# Patient Record
Sex: Male | Born: 1945 | Race: White | Hispanic: No | Marital: Married | Smoking: Never smoker
Health system: Southern US, Community
[De-identification: ages and names within clinical notes are randomized; demographics above are authoritative.]

## PROBLEM LIST (undated history)

## (undated) DIAGNOSIS — E782 Mixed hyperlipidemia: Secondary | ICD-10-CM

## (undated) DIAGNOSIS — I25119 Atherosclerotic heart disease of native coronary artery with unspecified angina pectoris: Secondary | ICD-10-CM

## (undated) DIAGNOSIS — Z7982 Long term (current) use of aspirin: Secondary | ICD-10-CM

## (undated) DIAGNOSIS — I700 Atherosclerosis of aorta: Secondary | ICD-10-CM

## (undated) DIAGNOSIS — M1990 Unspecified osteoarthritis, unspecified site: Secondary | ICD-10-CM

## (undated) DIAGNOSIS — R7303 Prediabetes: Secondary | ICD-10-CM

## (undated) DIAGNOSIS — H269 Unspecified cataract: Secondary | ICD-10-CM

## (undated) DIAGNOSIS — G4733 Obstructive sleep apnea (adult) (pediatric): Secondary | ICD-10-CM

## (undated) DIAGNOSIS — K5732 Diverticulitis of large intestine without perforation or abscess without bleeding: Secondary | ICD-10-CM

## (undated) DIAGNOSIS — I219 Acute myocardial infarction, unspecified: Secondary | ICD-10-CM

## (undated) DIAGNOSIS — I10 Essential (primary) hypertension: Secondary | ICD-10-CM

## (undated) DIAGNOSIS — K219 Gastro-esophageal reflux disease without esophagitis: Secondary | ICD-10-CM

## (undated) DIAGNOSIS — G4730 Sleep apnea, unspecified: Secondary | ICD-10-CM

## (undated) DIAGNOSIS — G4700 Insomnia, unspecified: Secondary | ICD-10-CM

## (undated) DIAGNOSIS — D649 Anemia, unspecified: Secondary | ICD-10-CM

## (undated) DIAGNOSIS — N529 Male erectile dysfunction, unspecified: Secondary | ICD-10-CM

## (undated) DIAGNOSIS — C679 Malignant neoplasm of bladder, unspecified: Secondary | ICD-10-CM

## (undated) DIAGNOSIS — Z87442 Personal history of urinary calculi: Secondary | ICD-10-CM

## (undated) DIAGNOSIS — I4510 Unspecified right bundle-branch block: Secondary | ICD-10-CM

## (undated) DIAGNOSIS — J189 Pneumonia, unspecified organism: Secondary | ICD-10-CM

## (undated) HISTORY — PX: ESOPHAGOGASTRODUODENOSCOPY: SHX1529

## (undated) HISTORY — DX: Gastro-esophageal reflux disease without esophagitis: K21.9

## (undated) HISTORY — PX: CHOLECYSTECTOMY: SHX55

## (undated) HISTORY — PX: TONSILLECTOMY: SUR1361

## (undated) HISTORY — DX: Essential (primary) hypertension: I10

## (undated) HISTORY — PX: COLONOSCOPY: SHX174

## (undated) HISTORY — DX: Pneumonia, unspecified organism: J18.9

## (undated) HISTORY — DX: Sleep apnea, unspecified: G47.30

## (undated) HISTORY — DX: Prediabetes: R73.03

---
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
## 2003-06-15 HISTORY — PX: ERCP: SHX60

## 2003-07-06 HISTORY — PX: UPPER ESOPHAGEAL ENDOSCOPIC ULTRASOUND (EUS): SHX6562

## 2005-04-24 ENCOUNTER — Ambulatory Visit: Payer: Self-pay | Admitting: Otolaryngology

## 2007-03-06 ENCOUNTER — Ambulatory Visit: Payer: Self-pay | Admitting: Unknown Physician Specialty

## 2011-03-01 ENCOUNTER — Emergency Department: Payer: Self-pay | Admitting: Emergency Medicine

## 2012-10-23 ENCOUNTER — Ambulatory Visit: Payer: Self-pay | Admitting: Unknown Physician Specialty

## 2012-10-23 HISTORY — PX: COLONOSCOPY: SHX174

## 2014-01-27 DIAGNOSIS — R7303 Prediabetes: Secondary | ICD-10-CM

## 2014-01-27 HISTORY — DX: Prediabetes: R73.03

## 2015-12-25 ENCOUNTER — Observation Stay
Admission: EM | Admit: 2015-12-25 | Discharge: 2015-12-26 | Disposition: A | Discharge status: Home / Self Care | Payer: BC Managed Care – PPO | Attending: Internal Medicine | Admitting: Internal Medicine

## 2015-12-25 ENCOUNTER — Encounter: Payer: Self-pay | Admitting: Emergency Medicine

## 2015-12-25 ENCOUNTER — Emergency Department: Payer: BC Managed Care – PPO

## 2015-12-25 DIAGNOSIS — R1084 Generalized abdominal pain: Secondary | ICD-10-CM | POA: Diagnosis not present

## 2015-12-25 DIAGNOSIS — Z87442 Personal history of urinary calculi: Secondary | ICD-10-CM | POA: Diagnosis not present

## 2015-12-25 DIAGNOSIS — Z880 Allergy status to penicillin: Secondary | ICD-10-CM | POA: Diagnosis not present

## 2015-12-25 DIAGNOSIS — N211 Calculus in urethra: Secondary | ICD-10-CM | POA: Insufficient documentation

## 2015-12-25 DIAGNOSIS — R1030 Lower abdominal pain, unspecified: Secondary | ICD-10-CM | POA: Diagnosis not present

## 2015-12-25 DIAGNOSIS — Z9049 Acquired absence of other specified parts of digestive tract: Secondary | ICD-10-CM | POA: Insufficient documentation

## 2015-12-25 DIAGNOSIS — R109 Unspecified abdominal pain: Secondary | ICD-10-CM

## 2015-12-25 DIAGNOSIS — R112 Nausea with vomiting, unspecified: Secondary | ICD-10-CM | POA: Diagnosis not present

## 2015-12-25 DIAGNOSIS — N201 Calculus of ureter: Secondary | ICD-10-CM

## 2015-12-25 DIAGNOSIS — N289 Disorder of kidney and ureter, unspecified: Secondary | ICD-10-CM | POA: Diagnosis not present

## 2015-12-25 DIAGNOSIS — N1330 Unspecified hydronephrosis: Secondary | ICD-10-CM | POA: Diagnosis present

## 2015-12-25 DIAGNOSIS — N132 Hydronephrosis with renal and ureteral calculous obstruction: Secondary | ICD-10-CM | POA: Diagnosis not present

## 2015-12-25 DIAGNOSIS — J189 Pneumonia, unspecified organism: Secondary | ICD-10-CM | POA: Diagnosis present

## 2015-12-25 DIAGNOSIS — Z7982 Long term (current) use of aspirin: Secondary | ICD-10-CM | POA: Diagnosis not present

## 2015-12-25 DIAGNOSIS — N200 Calculus of kidney: Secondary | ICD-10-CM

## 2015-12-25 DIAGNOSIS — R310 Gross hematuria: Secondary | ICD-10-CM | POA: Diagnosis not present

## 2015-12-25 DIAGNOSIS — I10 Essential (primary) hypertension: Secondary | ICD-10-CM | POA: Insufficient documentation

## 2015-12-25 DIAGNOSIS — R944 Abnormal results of kidney function studies: Secondary | ICD-10-CM | POA: Diagnosis not present

## 2015-12-25 DIAGNOSIS — N281 Cyst of kidney, acquired: Secondary | ICD-10-CM | POA: Diagnosis not present

## 2015-12-25 DIAGNOSIS — I77811 Abdominal aortic ectasia: Secondary | ICD-10-CM | POA: Diagnosis not present

## 2015-12-25 DIAGNOSIS — R1031 Right lower quadrant pain: Secondary | ICD-10-CM | POA: Diagnosis not present

## 2015-12-25 HISTORY — DX: Personal history of urinary calculi: Z87.442

## 2015-12-25 HISTORY — DX: Essential (primary) hypertension: I10

## 2015-12-25 HISTORY — DX: Pneumonia, unspecified organism: J18.9

## 2015-12-25 LAB — URINALYSIS COMPLETE WITH MICROSCOPIC (ARMC ONLY)
Bacteria, UA: NONE SEEN
Bilirubin Urine: NEGATIVE
GLUCOSE, UA: NEGATIVE mg/dL
Ketones, ur: NEGATIVE mg/dL
Leukocytes, UA: NEGATIVE
Nitrite: NEGATIVE
Protein, ur: 100 mg/dL — AB
Specific Gravity, Urine: 1.025 (ref 1.005–1.030)
pH: 5 (ref 5.0–8.0)

## 2015-12-25 LAB — CBC
HCT: 36.7 % — ABNORMAL LOW (ref 40.0–52.0)
Hemoglobin: 12.8 g/dL — ABNORMAL LOW (ref 13.0–18.0)
MCH: 29.7 pg (ref 26.0–34.0)
MCHC: 35 g/dL (ref 32.0–36.0)
MCV: 85 fL (ref 80.0–100.0)
PLATELETS: 233 10*3/uL (ref 150–440)
RBC: 4.31 MIL/uL — ABNORMAL LOW (ref 4.40–5.90)
RDW: 13.9 % (ref 11.5–14.5)
WBC: 11.2 10*3/uL — AB (ref 3.8–10.6)

## 2015-12-25 LAB — COMPREHENSIVE METABOLIC PANEL
ALT: 29 U/L (ref 17–63)
AST: 23 U/L (ref 15–41)
Albumin: 4.4 g/dL (ref 3.5–5.0)
Alkaline Phosphatase: 61 U/L (ref 38–126)
Anion gap: 8 (ref 5–15)
BUN: 46 mg/dL — AB (ref 6–20)
CHLORIDE: 102 mmol/L (ref 101–111)
CO2: 25 mmol/L (ref 22–32)
CREATININE: 1.57 mg/dL — AB (ref 0.61–1.24)
Calcium: 9.2 mg/dL (ref 8.9–10.3)
GFR calc Af Amer: 50 mL/min — ABNORMAL LOW (ref >60)
GFR, EST NON AFRICAN AMERICAN: 43 mL/min — AB (ref >60)
Glucose, Bld: 133 mg/dL — ABNORMAL HIGH (ref 65–99)
Potassium: 3.5 mmol/L (ref 3.5–5.1)
Sodium: 135 mmol/L (ref 135–145)
Total Bilirubin: 0.6 mg/dL (ref 0.3–1.2)
Total Protein: 7.7 g/dL (ref 6.5–8.1)

## 2015-12-25 LAB — TROPONIN I

## 2015-12-25 LAB — LIPASE, BLOOD: LIPASE: 50 U/L (ref 11–51)

## 2015-12-25 MED ORDER — IOPAMIDOL (ISOVUE-370) INJECTION 76%
80.0000 mL | Freq: Once | INTRAVENOUS | Status: AC | PRN
  Administered 2015-12-25: 80 mL via INTRAVENOUS

## 2015-12-25 MED ORDER — OXYCODONE HCL 5 MG PO TABS
5.0000 mg | ORAL_TABLET | Freq: Once | ORAL | Status: AC
Start: 2015-12-25 — End: 2015-12-25
  Administered 2015-12-25: 5 mg via ORAL
  Filled 2015-12-25: qty 1

## 2015-12-25 MED ORDER — HYDROMORPHONE HCL 1 MG/ML IJ SOLN
1.0000 mg | INTRAMUSCULAR | Status: AC
  Administered 2015-12-25: 1 mg via INTRAVENOUS
  Filled 2015-12-25: qty 1

## 2015-12-25 MED ORDER — SODIUM CHLORIDE 0.9 % IV SOLN
INTRAVENOUS | Status: DC
  Administered 2015-12-25 – 2015-12-26 (×3): via INTRAVENOUS

## 2015-12-25 MED ORDER — ONDANSETRON HCL 4 MG PO TABS: 4.0000 mg | ORAL_TABLET | Freq: Four times a day (QID) | ORAL | Status: DC | PRN

## 2015-12-25 MED ORDER — VITAMIN D 1000 UNITS PO TABS
2000.0000 [IU] | ORAL_TABLET | Freq: Every day | ORAL | Status: DC
  Administered 2015-12-25 – 2015-12-26 (×2): 2000 [IU] via ORAL
  Filled 2015-12-25 (×2): qty 2

## 2015-12-25 MED ORDER — ONDANSETRON HCL 4 MG/2ML IJ SOLN
4.0000 mg | Freq: Four times a day (QID) | INTRAMUSCULAR | Status: DC | PRN
  Administered 2015-12-25 – 2015-12-26 (×3): 4 mg via INTRAVENOUS
  Filled 2015-12-25 (×3): qty 2

## 2015-12-25 MED ORDER — SODIUM CHLORIDE 0.9 % IV SOLN
Freq: Once | INTRAVENOUS | Status: AC
  Administered 2015-12-25: 06:00:00 via INTRAVENOUS

## 2015-12-25 MED ORDER — LEVOFLOXACIN IN D5W 750 MG/150ML IV SOLN
750.0000 mg | Freq: Once | INTRAVENOUS | Status: AC
  Administered 2015-12-25: 750 mg via INTRAVENOUS
  Filled 2015-12-25: qty 150

## 2015-12-25 MED ORDER — ZOLPIDEM TARTRATE 5 MG PO TABS: 5.0000 mg | ORAL_TABLET | Freq: Every evening | ORAL | Status: DC | PRN

## 2015-12-25 MED ORDER — HYDROMORPHONE HCL 1 MG/ML IJ SOLN
INTRAMUSCULAR | Status: AC
  Filled 2015-12-25: qty 1

## 2015-12-25 MED ORDER — ACETAMINOPHEN 325 MG PO TABS
650.0000 mg | ORAL_TABLET | Freq: Four times a day (QID) | ORAL | Status: DC | PRN
  Administered 2015-12-26: 650 mg via ORAL
  Filled 2015-12-25: qty 2

## 2015-12-25 MED ORDER — PREDNISONE 5 MG PO TABS: 5.0000 mg | ORAL_TABLET | Freq: Once | ORAL | Status: DC

## 2015-12-25 MED ORDER — MORPHINE SULFATE (PF) 2 MG/ML IV SOLN
2.0000 mg | INTRAVENOUS | Status: DC | PRN
  Administered 2015-12-25 – 2015-12-26 (×3): 2 mg via INTRAVENOUS
  Filled 2015-12-25 (×3): qty 1

## 2015-12-25 MED ORDER — ACETAMINOPHEN 650 MG RE SUPP: 650.0000 mg | Freq: Four times a day (QID) | RECTAL | Status: DC | PRN

## 2015-12-25 MED ORDER — MORPHINE SULFATE (PF) 2 MG/ML IV SOLN
2.0000 mg | Freq: Once | INTRAVENOUS | Status: AC
  Administered 2015-12-25: 2 mg via INTRAVENOUS
  Filled 2015-12-25: qty 1

## 2015-12-25 MED ORDER — AZITHROMYCIN 250 MG PO TABS: 250.0000 mg | ORAL_TABLET | Freq: Once | ORAL | Status: DC

## 2015-12-25 MED ORDER — DOCUSATE SODIUM 100 MG PO CAPS
100.0000 mg | ORAL_CAPSULE | Freq: Two times a day (BID) | ORAL | Status: DC
  Administered 2015-12-25 – 2015-12-26 (×2): 100 mg via ORAL
  Filled 2015-12-25 (×2): qty 1

## 2015-12-25 MED ORDER — HYDROCHLOROTHIAZIDE 25 MG PO TABS
25.0000 mg | ORAL_TABLET | Freq: Every day | ORAL | Status: DC
  Administered 2015-12-25: 25 mg via ORAL
  Filled 2015-12-25: qty 1

## 2015-12-25 MED ORDER — SODIUM CHLORIDE 0.9 % IV BOLUS (SEPSIS)
1000.0000 mL | INTRAVENOUS | Status: AC
  Administered 2015-12-25: 1000 mL via INTRAVENOUS

## 2015-12-25 MED ORDER — POTASSIUM CHLORIDE CRYS ER 20 MEQ PO TBCR
20.0000 meq | EXTENDED_RELEASE_TABLET | Freq: Once | ORAL | Status: AC
  Administered 2015-12-25: 20 meq via ORAL
  Filled 2015-12-25: qty 1

## 2015-12-25 MED ORDER — ADULT MULTIVITAMIN W/MINERALS CH
1.0000 | ORAL_TABLET | Freq: Every day | ORAL | Status: DC
  Administered 2015-12-25 – 2015-12-26 (×2): 1 via ORAL
  Filled 2015-12-25 (×2): qty 1

## 2015-12-25 MED ORDER — PREDNISONE 10 MG PO TABS
10.0000 mg | ORAL_TABLET | Freq: Once | ORAL | Status: AC
Start: 2015-12-25 — End: 2015-12-25
  Administered 2015-12-25: 10 mg via ORAL
  Filled 2015-12-25: qty 1

## 2015-12-25 MED ORDER — ONDANSETRON HCL 4 MG/2ML IJ SOLN
4.0000 mg | Freq: Once | INTRAMUSCULAR | Status: AC | PRN
  Administered 2015-12-25: 4 mg via INTRAVENOUS
  Filled 2015-12-25: qty 2

## 2015-12-25 MED ORDER — OXYCODONE HCL 5 MG PO TABS
5.0000 mg | ORAL_TABLET | Freq: Four times a day (QID) | ORAL | Status: DC | PRN
  Administered 2015-12-25 – 2015-12-26 (×4): 5 mg via ORAL
  Filled 2015-12-25 (×4): qty 1

## 2015-12-25 MED ORDER — TAMSULOSIN HCL 0.4 MG PO CAPS
0.4000 mg | ORAL_CAPSULE | Freq: Every day | ORAL | Status: DC
  Administered 2015-12-25: 0.4 mg via ORAL
  Filled 2015-12-25: qty 1

## 2015-12-25 MED ORDER — OMEGA-3-ACID ETHYL ESTERS 1 G PO CAPS
1.0000 g | ORAL_CAPSULE | Freq: Every day | ORAL | Status: DC
  Administered 2015-12-25 – 2015-12-26 (×2): 1 g via ORAL
  Filled 2015-12-25 (×2): qty 1

## 2015-12-25 MED ORDER — LEVOFLOXACIN IN D5W 750 MG/150ML IV SOLN
750.0000 mg | INTRAVENOUS | Status: DC
  Filled 2015-12-25: qty 150

## 2015-12-25 MED ORDER — DEXTROSE 5 % IV SOLN
1.0000 g | INTRAVENOUS | Status: AC
  Administered 2015-12-25: 1 g via INTRAVENOUS
  Filled 2015-12-25: qty 10

## 2015-12-25 MED ORDER — ASPIRIN EC 81 MG PO TBEC
81.0000 mg | DELAYED_RELEASE_TABLET | Freq: Every day | ORAL | Status: DC
  Administered 2015-12-25 – 2015-12-26 (×2): 81 mg via ORAL
  Filled 2015-12-25 (×2): qty 1

## 2015-12-25 MED ORDER — PANTOPRAZOLE SODIUM 40 MG PO TBEC
40.0000 mg | DELAYED_RELEASE_TABLET | Freq: Every day | ORAL | Status: DC
  Administered 2015-12-25 – 2015-12-26 (×2): 40 mg via ORAL
  Filled 2015-12-25 (×2): qty 1

## 2015-12-25 MED ORDER — LISINOPRIL 20 MG PO TABS
40.0000 mg | ORAL_TABLET | Freq: Every day | ORAL | Status: DC
  Administered 2015-12-25: 40 mg via ORAL
  Filled 2015-12-25: qty 2
  Filled 2015-12-25: qty 4

## 2015-12-25 MED ORDER — HYDROMORPHONE HCL 1 MG/ML IJ SOLN
1.0000 mg | Freq: Once | INTRAMUSCULAR | Status: AC
  Administered 2015-12-25: 1 mg via INTRAVENOUS

## 2015-12-25 MED ORDER — DEXTROSE 5 % IV SOLN: 500.0000 mg | Freq: Once | INTRAVENOUS | Status: DC

## 2015-12-25 MED ORDER — HYDROCOD POLST-CPM POLST ER 10-8 MG/5ML PO SUER
5.0000 mL | Freq: Two times a day (BID) | ORAL | Status: DC
  Filled 2015-12-25: qty 5

## 2015-12-25 NOTE — ED Notes (Signed)
Pt with "severe" abdominal pain that began at midnight. Pt states has felt tingling to bilateral feet. Pt writhing in pain in triage. Pt states pain is mid lower abd, does not radiate to back. Pt states both parents with history of AAA. Pt states he may have had hematuria this am. Last bowel movement loose and yesterday per pt. Pt slightly pale, dry skin.

## 2015-12-25 NOTE — ED Provider Notes (Signed)
[facility] Emergency Department Provider Note  ____________________________________________  Time seen: Approximately 2:04 AM  I have reviewed the triage vital signs and the nursing notes.   HISTORY  Chief Complaint Abdominal Pain    HPI Andre Stone is a 70 y.o. male with a past medical history of hypertensionwho presents with acute onset of lower midline abdominal pain.  He states that he had some discomfort earlier in the day but did not think much of it.  He had a normal meal for dinner and then went to bed.  At approximately midnight severe, sharp, stabbing abdominal pain began and has been unrelenting.  Nothing makes it better and nothing makes it worse.  Additionally he feels some tingling and possibly numbness in his legs, worse on the right.  He is nauseated and has vomited at least once.  He denies chest pain and states that he has shortness of breath only because of the amount of pain he is in.  He is alert and oriented.  He is significantly hypertensive in triage.  He also urinated on arrival in the exam room and he has gross hematuria.  Does have a history of kidney stones.  Denies any history of chronic renal insufficiency.   Past Medical History  Diagnosis Date  . Hypertension   . History of kidney stones     There are no active problems to display for this patient.   History reviewed. No pertinent past surgical history.  No current outpatient prescriptions on file.  Allergies Penicillins  History reviewed. No pertinent family history.  Family history The patient reports that both of his parents suffered from aortic aneurysms and possibly dissection, he cannot remember for sure  Social History Social History  Substance Use Topics  . Smoking status: Never Smoker   . Smokeless tobacco: None  . Alcohol Use: Yes     Comment: regular, several times per week, liquor    Review of Systems Constitutional: No fever/chills Eyes: No  visual changes. ENT: No sore throat. Cardiovascular: Denies chest pain. Respiratory: Denies shortness of breath. Gastrointestinal: Severe lower midline abdominal pain with nausea and vomiting Genitourinary: Negative for dysuria. Musculoskeletal: Negative for back pain. Skin: Negative for rash. Neurological: Numbness and tingling in both of his legs, worse on the right  10-point ROS otherwise negative.  ____________________________________________   PHYSICAL EXAM:  VITAL SIGNS: ED Triage Vitals  Enc Vitals Group     BP 12/25/15 0137 170/117 mmHg     Pulse Rate 12/25/15 0137 105     Resp 12/25/15 0137 26     Temp 12/25/15 0137 97.7 F (36.5 C)     Temp Source 12/25/15 0137 Oral     SpO2 12/25/15 0137 100 %     Weight 12/25/15 0137 170 lb (77.111 kg)     Height 12/25/15 0137 6' (1.829 m)     Head Cir --      Peak Flow --      Pain Score 12/25/15 0138 10     Pain Loc --      Pain Edu? --      Excl. in [place]? --     Constitutional: Alert and oriented but in severe distress from abdominal pain Eyes: Conjunctivae are normal. PERRL. EOMI. Head: Atraumatic. Nose: No congestion/rhinnorhea. Mouth/Throat: Mucous membranes are moist.  Oropharynx non-erythematous. Neck: No stridor.  No meningeal signs.   Cardiovascular: Normal rate, regular rhythm. Good peripheral circulation. Grossly normal heart sounds.   Respiratory: Normal respiratory  effort.  No retractions. Lungs CTAB. Gastrointestinal: Soft and nontender. No distention. No pulsatile masses, no bruits on abdominal auscultation Musculoskeletal: No lower extremity tenderness nor edema. No gross deformities of extremities.  Normal and easily palpated DP pulses bilaterally. Neurologic:  Normal speech and language. No gross focal neurologic deficits are appreciated.  Skin:  Skin is warm and dry but the patient is pale. Psychiatric: Mood and affect are normal. Speech and behavior are  normal.  ____________________________________________   LABS (all labs ordered are listed, but only abnormal results are displayed)  Labs Reviewed  COMPREHENSIVE METABOLIC PANEL - Abnormal; Notable for the following:    Glucose, Bld 133 (*)    BUN 46 (*)    Creatinine, Ser 1.57 (*)    GFR calc non Af Amer 43 (*)    GFR calc Af Amer 50 (*)    All other components within normal limits  CBC - Abnormal; Notable for the following:    WBC 11.2 (*)    RBC 4.31 (*)    Hemoglobin 12.8 (*)    HCT 36.7 (*)    All other components within normal limits  URINALYSIS COMPLETEWITH MICROSCOPIC ([facility] ONLY) - Abnormal; Notable for the following:    Color, Urine AMBER (*)    APPearance CLOUDY (*)    Hgb urine dipstick 3+ (*)    Protein, ur 100 (*)    Squamous Epithelial / LPF 0-5 (*)    All other components within normal limits  LIPASE, BLOOD  TROPONIN I   ____________________________________________  EKG  ED ECG REPORT I, Madiline Saffran, the attending physician, personally viewed and interpreted this ECG.   Date: 12/25/2015  EKG Time: 01:53  Rate: 80  Rhythm: normal sinus rhythm  Axis: normal  Intervals:normal  ST&T Change: Non-specific ST segment / T-wave changes, but no evidence of acute ischemia.  ____________________________________________  RADIOLOGY  I, Temperence Zenor, personally discussed these images and results by phone with the on-call radiologist and used this discussion as part of my medical decision making.    Ct Angio Chest Aorta W/cm &/or Wo/cm  12/25/2015  CLINICAL DATA:  Acute onset of severe generalized abdominal pain and tingling at the feet. Question of hematuria. Initial encounter. EXAM: CT ANGIOGRAPHY CHEST, ABDOMEN AND PELVIS TECHNIQUE: Multidetector CT imaging through the chest, abdomen and pelvis was performed using the standard protocol during bolus administration of intravenous contrast. Multiplanar reconstructed images and MIPs were obtained and reviewed to  evaluate the vascular anatomy. CONTRAST:  80 mL of Isovue 370 IV contrast COMPARISON:  CT of the abdomen and pelvis from 03/01/2011 FINDINGS: CTA CHEST FINDINGS There is no evidence of aortic dissection. There is no evidence of aneurysmal dilatation. The ascending thoracic aorta is borderline normal in caliber. Minimal calcification is noted along the aortic arch and descending thoracic aorta. There is no evidence of central pulmonary embolus. Mild central left basilar airspace opacity may reflect atelectasis or mild pneumonia. There is no evidence of pleural effusion or pneumothorax. No masses are identified; no abnormal focal contrast enhancement is seen. The mediastinum is unremarkable appearance, aside from diffuse coronary artery calcification. No mediastinal lymphadenopathy is seen. No pericardial effusion is identified. The great vessels are grossly unremarkable in appearance. No axillary lymphadenopathy is seen. The thyroid gland is unremarkable in appearance. No acute osseous abnormalities are seen. Review of the MIP images confirms the above findings. CTA ABDOMEN AND PELVIS FINDINGS There is no evidence of acute aortic dissection. There is no evidence of aneurysmal dilatation. The  small ulcerative plaque at the distal abdominal aorta has increased mildly in size. There is mild ectasia of the abdominal aorta at this level, measuring up to 2.4 cm in diameter. 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 This appearance is essentially unchanged from 2012, aside from perhaps slightly increased prominence of the splenic artery. The celiac trunk remains patent. The superior mesenteric artery and bilateral renal arteries are grossly unremarkable in appearance. Two left-sided renal arteries are noted. The  inferior mesenteric artery is also patent. The liver and spleen are unremarkable in appearance. The patient is status post cholecystectomy, with clips noted at the gallbladder fossa. The pancreas and adrenal glands are unremarkable. There is mild right-sided hydronephrosis, which reflects a 4 mm obstructing stone at the proximal right ureter, 3-4 cm below the right renal pelvis. Right-sided perinephric stranding and fluid are noted. A 4.8 cm cyst is noted at the upper pole of the right kidney. Mild nonspecific left-sided perinephric stranding is seen. A nonobstructing 2 mm stone is noted at the lower pole of the right kidney. No free fluid is identified. The small bowel is unremarkable in appearance. The stomach is within normal limits. No acute vascular abnormalities are seen. The appendix is normal in caliber, without evidence of appendicitis. The colon is unremarkable in appearance. The bladder is mildly distended and grossly unremarkable. The prostate remains normal in size, with minimal calcification. No inguinal lymphadenopathy is seen. No acute osseous abnormalities are identified. Review of the MIP images confirms the above findings. IMPRESSION: 1. No evidence of acute aortic dissection. No evidence of aneurysmal dilatation. 2. Mild right-sided hydronephrosis, reflecting a 4 mm obstructing stone at the proximal right ureter, 3-4 cm below the right renal pelvis. 3. Mild central left basilar airspace opacity may reflect atelectasis or mild pneumonia. 4. 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 Mild ectasia of the abdominal aorta at this level, without evidence of aneurysmal dilatation. 5. 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 This is essentially unchanged in  appearance from 2012, aside from perhaps slightly increased prominence of the splenic artery. The celiac trunk remains patent. 6. Nonobstructing 2 mm stone at the lower pole of right kidney. Right renal cyst noted. These results were called by telephone at the time of interpretation on 12/25/2015 at 3:18 am to Dr. Hinda Kehr, who verbally acknowledged these results. Electronically Signed   By: Garald Balding M.D.   On: 12/25/2015 03:18   Ct Cta Abd/pel W/cm &/or W/o Cm  12/25/2015  CLINICAL DATA:  Acute onset of severe generalized abdominal pain and tingling at the feet. Question of hematuria. Initial encounter. EXAM: CT ANGIOGRAPHY CHEST, ABDOMEN AND PELVIS TECHNIQUE: Multidetector CT imaging through the chest, abdomen and pelvis was performed using the standard protocol during bolus administration of intravenous contrast. Multiplanar reconstructed images and MIPs were obtained and reviewed to evaluate the vascular anatomy. CONTRAST:  80 mL of Isovue 370 IV contrast COMPARISON:  CT of the abdomen and pelvis from 03/01/2011 FINDINGS: CTA CHEST FINDINGS There is no evidence of aortic dissection. There is no evidence of aneurysmal  dilatation. The ascending thoracic aorta is borderline normal in caliber. Minimal calcification is noted along the aortic arch and descending thoracic aorta. There is no evidence of central pulmonary embolus. Mild central left basilar airspace opacity may reflect atelectasis or mild pneumonia. There is no evidence of pleural effusion or pneumothorax. No masses are identified; no abnormal focal contrast enhancement is seen. The mediastinum is unremarkable appearance, aside from diffuse coronary artery calcification. No mediastinal lymphadenopathy is seen. No pericardial effusion is identified. The great vessels are grossly unremarkable in appearance. No axillary lymphadenopathy is seen. The thyroid gland is unremarkable in appearance. No acute osseous abnormalities are seen. Review of the  MIP images confirms the above findings. CTA ABDOMEN AND PELVIS FINDINGS There is no evidence of acute aortic dissection. There is no evidence of aneurysmal dilatation. The small ulcerative plaque at the distal abdominal aorta has increased mildly in size. There is mild ectasia of the abdominal aorta at this level, measuring up to 2.4 cm in diameter. 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 This appearance is essentially unchanged from 2012, aside from perhaps slightly increased prominence of the splenic artery. The celiac trunk remains patent. The superior mesenteric artery and bilateral renal arteries are grossly unremarkable in appearance. Two left-sided renal arteries are noted. The inferior mesenteric artery is also patent. The liver and spleen are unremarkable in appearance. The patient is status post cholecystectomy, with clips noted at the gallbladder fossa. The pancreas and adrenal glands are unremarkable. There is mild right-sided hydronephrosis, which reflects a 4 mm obstructing stone at the proximal right ureter, 3-4 cm below the right renal pelvis. Right-sided perinephric stranding and fluid are noted. A 4.8 cm cyst is noted at the upper pole of the right kidney. Mild nonspecific left-sided perinephric stranding is seen. A nonobstructing 2 mm stone is noted at the lower pole of the right kidney. No free fluid is identified. The small bowel is unremarkable in appearance. The stomach is within normal limits. No acute vascular abnormalities are seen. The appendix is normal in caliber, without evidence of appendicitis. The colon is unremarkable in appearance. The bladder is mildly distended and grossly unremarkable. The prostate remains normal in size, with minimal calcification. No inguinal  lymphadenopathy is seen. No acute osseous abnormalities are identified. Review of the MIP images confirms the above findings. IMPRESSION: 1. No evidence of acute aortic dissection. No evidence of aneurysmal dilatation. 2. Mild right-sided hydronephrosis, reflecting a 4 mm obstructing stone at the proximal right ureter, 3-4 cm below the right renal pelvis. 3. Mild central left basilar airspace opacity may reflect atelectasis or mild pneumonia. 4. 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 Mild ectasia of the abdominal aorta at this level, without evidence of aneurysmal dilatation. 5. 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 This is essentially unchanged in appearance from 2012, aside from perhaps slightly increased prominence of the splenic artery. The celiac trunk remains patent. 6. Nonobstructing 2 mm stone at the lower pole of right kidney. Right renal cyst noted. These results were called by telephone at the time of interpretation  on 12/25/2015 at 3:18 am to Dr. Hinda Kehr, who verbally acknowledged these results. Electronically Signed   By: Garald Balding M.D.   On: 12/25/2015 03:18    ____________________________________________   PROCEDURES  Procedure(s) performed: None  Critical Care performed: Yes, see critical care note(s)  CRITICAL CARE Performed by: Hinda Kehr   Total critical care time: 45 minutes  Critical care time was exclusive of separately billable procedures and treating other patients.  Critical care was necessary to treat or prevent imminent or life-threatening deterioration.  Critical care was time spent personally by me on the following activities: development of treatment plan with patient and/or surrogate as well as nursing,  discussions with consultants, evaluation of patient's response to treatment, examination of patient, obtaining history from patient or surrogate, ordering and performing treatments and interventions, ordering and review of laboratory studies, ordering and review of radiographic studies, pulse oximetry and re-evaluation of patient's condition.  ____________________________________________   INITIAL IMPRESSION / ASSESSMENT AND PLAN / ED COURSE  Pertinent labs & imaging results that were available during my care of the patient were reviewed by me and considered in my medical decision making (see chart for details).  The patient is in significant discomfort at this time with severe abdominal pain as well as subjective neurological symptoms in his legs.  He has a strong family history of AAA.  He has gross hematuria which strongly points towards a kidney stone but does not rule out aortic pathology.  I discussed with the patient and his wife that he does have some renal insufficiency but that I am most concerned about ruling out a potentially life-threatening aortic aneurysm/dissection even though this may be a simple kidney stone.  They understand and agree, and he has the capacity to make medical decisions at this time and does want to proceed with CT angiogram chest/abd/pelvis.  I considered starting antihypertensives at this time but if his blood pressure is high because of pain I want to treat the pain first.  He received Dilaudid 1 mg IV and went immediately to CT scan with my verbal consent given the reduced GFR.  I will reassess him as soon as he gets back in if his blood pressure is still elevated in spite of pain relief I will start esmolol empirically until we get the CT scan results.  ----------------------------------------- 3:32 AM on 12/25/2015 -----------------------------------------  Patient's pain was not controlled when he came back from the CT scan so I gave another milligram of  Dilaudid.  Blood pressure still elevated but pain still 10 out of 10.  I just reassessed him and now his pain is much better.  Additionally I spoke by phone with Dr. Radene Knee the radiologist to tell me that there is nothing acute in his aorta and no life-threatening medical condition is currently present.  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  I informed the patient of this but also told him about the 4 mm obstructing kidney stone is likely the cause of his current discomfort.  No indication for esmolol now that aorta is non-acute.  Pain better controlled currently.  Explained plan for either outpatient f/u or admission for pain control.  He will let me know.  ----------------------------------------- 4:47 AM on 12/25/2015 -----------------------------------------  The patient required a third dose of Dilaudid.  He is currently more comfortable but still feels the discomfort.  I do not believe it is safe to send him home given  not only the multiple doses of Dilaudid he has had but still the intractable nature of the pain.  He also feels more comfortable staying in the hospital for observation.  I am consult the hospitalist for an observation stay for intractable pain, possibly with urology consult.  Additionally, given the fact he has recently been treated for bronchitis and he has questionable pneumonia and his lungs I will give him a dose of ceftriaxone and azithromycin and discussed this with the hospitalist as well.  ____________________________________________  FINAL CLINICAL IMPRESSION(S) / ED DIAGNOSES  Final diagnoses:  Intractable abdominal pain  Ureterolithiasis  Hydronephrosis of right kidney  CAP (community acquired pneumonia)      NEW MEDICATIONS STARTED DURING THIS VISIT:  New Prescriptions   No medications on file      Note:  This document was prepared using Dragon voice recognition software and may  include unintentional dictation errors.   Hinda Kehr, MD 12/25/15 [phone]

## 2015-12-25 NOTE — H&P (Signed)
[facility] NAME: Andre Stone    MR#:  [id number]  DATE OF BIRTH:  1946-01-07  DATE OF ADMISSION:  12/25/2015  PRIMARY CARE PHYSICIAN: Dion Body, MD   REQUESTING/REFERRING PHYSICIAN:   CHIEF COMPLAINT:   Chief Complaint  Patient presents with  . Abdominal Pain    HISTORY OF PRESENT ILLNESS: Andre Stone  is a 70 y.o. male with a known history of hypertension, nephrolithiasis presented to the emergency room with abdominal pain. The abdominal pain started yesterday in the evening and became very severe by midnight. Abdominal pain is located below the umbilicus and above the pubic symphysis. Pain is sharp in nature was 10 out of 10 on a scale of 1-10. Patient also has some right flank pain. Has history of nausea and vomiting. No history of fever or chills. Patient was worked up in the emergency room was found to have a right ureteral stone and mild hydronephrosis. Patient also complains of cough which is productive of yellowish phlegm and he is currently taking oral Zithromax for bronchitis. No history of recent travel. No history of any sick contacts at home.  PAST MEDICAL HISTORY:   Past Medical History  Diagnosis Date  . Hypertension   . History of kidney stones     PAST SURGICAL HISTORY: Past Surgical History  Procedure Laterality Date  . Cholecystectomy    . Tonsillectomy      SOCIAL HISTORY:  Social History  Substance Use Topics  . Smoking status: Never Smoker   . Smokeless tobacco: Not on file  . Alcohol Use: Yes     Comment: regular, several times per week, liquor    FAMILY HISTORY: History reviewed. No pertinent family history.  DRUG ALLERGIES: pencillin   REVIEW OF SYSTEMS:   CONSTITUTIONAL: No fever, fatigue or weakness.  EYES: No blurred or double vision.  EARS, NOSE, AND THROAT: No tinnitus or ear pain.  RESPIRATORY: Has cough, no shortness of breath, no wheezing or hemoptysis.   CARDIOVASCULAR: No chest pain, orthopnea, edema.  GASTROINTESTINAL: Has nausea,has vomiting, no diarrhea, complaints of abdominal pain.  GENITOURINARY: No dysuria, hematuria.  ENDOCRINE: No polyuria, nocturia,  HEMATOLOGY: No anemia, easy bruising or bleeding SKIN: No rash or lesion. MUSCULOSKELETAL: No joint pain or arthritis.   NEUROLOGIC: No tingling, numbness, weakness.  PSYCHIATRY: No anxiety or depression.   MEDICATIONS AT HOME:  Prior to Admission medications   Medication Sig Start Date End Date Taking? Authorizing Provider  aspirin EC 81 MG tablet Take 81 mg by mouth daily.   Yes Historical Provider, MD  azithromycin (ZITHROMAX) 250 MG tablet Take 1 tablet by mouth daily. 12/21/15 12/26/15 Yes Historical Provider, MD  chlorpheniramine-HYDROcodone (TUSSIONEX) 10-8 MG/5ML SUER Take 5 mLs by mouth 2 (two) times daily. 12/21/15  Yes Historical Provider, MD  cholecalciferol (VITAMIN D) 1000 units tablet Take 2,000 Units by mouth daily.   Yes Historical Provider, MD  hydrochlorothiazide (HYDRODIURIL) 25 MG tablet Take 25 mg by mouth daily.   Yes Historical Provider, MD  lisinopril (PRINIVIL,ZESTRIL) 40 MG tablet Take 40 mg by mouth daily.   Yes Historical Provider, MD  Melatonin-Pyridoxine (MELATIN) 3-1 MG TABS Take 3 mg by mouth at bedtime as needed.   Yes Historical Provider, MD  Multiple Vitamins-Minerals (MULTIVITAMIN WITH MINERALS) tablet Take 1 tablet by mouth daily.   Yes Historical Provider, MD  Omega-3 Fatty Acids (FISH OIL) 1000 MG CAPS Take 1,000 mg by mouth daily.   Yes Historical  Provider, MD  predniSONE (DELTASONE) 5 MG tablet Take 1 tablet by mouth as directed. 6 day taper 12/21/15  Yes Historical Provider, MD  zolpidem (AMBIEN) 5 MG tablet Take 5 mg by mouth at bedtime as needed for sleep (pt takes 0.5 tablet prn).   Yes Historical Provider, MD      PHYSICAL EXAMINATION:   VITAL SIGNS: Blood pressure 147/98, pulse 61, temperature 97.7 F (36.5 C), temperature source Oral,  resp. rate 16, height 6' (1.829 m), weight 77.111 kg (170 lb), SpO2 93 %.  GENERAL:  70 y.o.-year-old patient lying in the bed with no acute distress.  EYES: Pupils equal, round, reactive to light and accommodation. No scleral icterus. Extraocular muscles intact.  HEENT: Head atraumatic, normocephalic. Oropharynx dry and nasopharynx clear.  NECK:  Supple, no jugular venous distention. No thyroid enlargement, no tenderness.  LUNGS: Normal breath sounds bilaterally, no wheezing, rales,rhonchi or crepitation. No use of accessory muscles of respiration.  CARDIOVASCULAR: S1, S2 normal. No murmurs, rubs, or gallops.  ABDOMEN: Soft, tenderness below umbilicus, nondistended. Bowel sounds present. No organomegaly or mass.  Right costovertebral angle tenderness noted. EXTREMITIES: No pedal edema, cyanosis, or clubbing.  NEUROLOGIC: Cranial nerves II through XII are intact. Muscle strength 5/5 in all extremities. Sensation intact. Gait not checked.  PSYCHIATRIC: The patient is alert and oriented x 3.  SKIN: No obvious rash, lesion, or ulcer.   LABORATORY PANEL:   CBC  Recent Labs Lab 12/25/15 0146  WBC 11.2*  HGB 12.8*  HCT 36.7*  PLT 233  MCV 85.0  MCH 29.7  MCHC 35.0  RDW 13.9   ------------------------------------------------------------------------------------------------------------------  Chemistries   Recent Labs Lab 12/25/15 0146  NA 135  K 3.5  CL 102  CO2 25  GLUCOSE 133*  BUN 46*  CREATININE 1.57*  CALCIUM 9.2  AST 23  ALT 29  ALKPHOS 61  BILITOT 0.6   ------------------------------------------------------------------------------------------------------------------ estimated creatinine clearance is 48.4 mL/min (by C-G formula based on Cr of 1.57). ------------------------------------------------------------------------------------------------------------------ No results for input(s): TSH, T4TOTAL, T3FREE, THYROIDAB in the last 72 hours.  Invalid input(s):  FREET3   Coagulation profile No results for input(s): INR, PROTIME in the last 168 hours. ------------------------------------------------------------------------------------------------------------------- No results for input(s): DDIMER in the last 72 hours. -------------------------------------------------------------------------------------------------------------------  Cardiac Enzymes  Recent Labs Lab 12/25/15 0146  TROPONINI <0.03   ------------------------------------------------------------------------------------------------------------------ Invalid input(s): POCBNP  ---------------------------------------------------------------------------------------------------------------  Urinalysis    Component Value Date/Time   COLORURINE AMBER* 12/25/2015 0200   APPEARANCEUR CLOUDY* 12/25/2015 0200   LABSPEC 1.025 12/25/2015 0200   PHURINE 5.0 12/25/2015 0200   GLUCOSEU NEGATIVE 12/25/2015 0200   HGBUR 3+* 12/25/2015 0200   BILIRUBINUR NEGATIVE 12/25/2015 0200   KETONESUR NEGATIVE 12/25/2015 0200   PROTEINUR 100* 12/25/2015 0200   NITRITE NEGATIVE 12/25/2015 0200   LEUKOCYTESUR NEGATIVE 12/25/2015 0200     RADIOLOGY: Ct Angio Chest Aorta W/cm &/or Wo/cm  12/25/2015  CLINICAL DATA:  Acute onset of severe generalized abdominal pain and tingling at the feet. Question of hematuria. Initial encounter. EXAM: CT ANGIOGRAPHY CHEST, ABDOMEN AND PELVIS TECHNIQUE: Multidetector CT imaging through the chest, abdomen and pelvis was performed using the standard protocol during bolus administration of intravenous contrast. Multiplanar reconstructed images and MIPs were obtained and reviewed to evaluate the vascular anatomy. CONTRAST:  80 mL of Isovue 370 IV contrast COMPARISON:  CT of the abdomen and pelvis from 03/01/2011 FINDINGS: CTA CHEST FINDINGS There is no evidence of aortic dissection. There is no evidence of aneurysmal dilatation. The ascending thoracic aorta is  borderline normal  in caliber. Minimal calcification is noted along the aortic arch and descending thoracic aorta. There is no evidence of central pulmonary embolus. Mild central left basilar airspace opacity may reflect atelectasis or mild pneumonia. There is no evidence of pleural effusion or pneumothorax. No masses are identified; no abnormal focal contrast enhancement is seen. The mediastinum is unremarkable appearance, aside from diffuse coronary artery calcification. No mediastinal lymphadenopathy is seen. No pericardial effusion is identified. The great vessels are grossly unremarkable in appearance. No axillary lymphadenopathy is seen. The thyroid gland is unremarkable in appearance. No acute osseous abnormalities are seen. Review of the MIP images confirms the above findings. CTA ABDOMEN AND PELVIS FINDINGS There is no evidence of acute aortic dissection. There is no evidence of aneurysmal dilatation. The small ulcerative plaque at the distal abdominal aorta has increased mildly in size. There is mild ectasia of the abdominal aorta at this level, measuring up to 2.4 cm in diameter. 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 This appearance is essentially unchanged from 2012, aside from perhaps slightly increased prominence of the splenic artery. The celiac trunk remains patent. The superior mesenteric artery and bilateral renal arteries are grossly unremarkable in appearance. Two left-sided renal arteries are noted. The inferior mesenteric artery is also patent. The liver and spleen are unremarkable in appearance. The patient is status post cholecystectomy, with clips noted at the gallbladder fossa. The pancreas and adrenal glands are unremarkable. There is mild right-sided hydronephrosis, which reflects a 4 mm obstructing  stone at the proximal right ureter, 3-4 cm below the right renal pelvis. Right-sided perinephric stranding and fluid are noted. A 4.8 cm cyst is noted at the upper pole of the right kidney. Mild nonspecific left-sided perinephric stranding is seen. A nonobstructing 2 mm stone is noted at the lower pole of the right kidney. No free fluid is identified. The small bowel is unremarkable in appearance. The stomach is within normal limits. No acute vascular abnormalities are seen. The appendix is normal in caliber, without evidence of appendicitis. The colon is unremarkable in appearance. The bladder is mildly distended and grossly unremarkable. The prostate remains normal in size, with minimal calcification. No inguinal lymphadenopathy is seen. No acute osseous abnormalities are identified. Review of the MIP images confirms the above findings. IMPRESSION: 1. No evidence of acute aortic dissection. No evidence of aneurysmal dilatation. 2. Mild right-sided hydronephrosis, reflecting a 4 mm obstructing stone at the proximal right ureter, 3-4 cm below the right renal pelvis. 3. Mild central left basilar airspace opacity may reflect atelectasis or mild pneumonia. 4. 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 Mild ectasia of the abdominal aorta at this level, without evidence of aneurysmal dilatation. 5. 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 This is essentially unchanged in appearance from 2012, aside from perhaps slightly increased prominence of the splenic artery. The celiac trunk remains patent. 6. Nonobstructing 2 mm stone at the lower pole of right kidney. Right renal cyst noted. These results were called by telephone at the time of interpretation on 12/25/2015 at 3:18 am to  Dr.  Hinda Kehr, who verbally acknowledged these results. Electronically Signed   By: Garald Balding M.D.   On: 12/25/2015 03:18   Ct Cta Abd/pel W/cm &/or W/o Cm  12/25/2015  CLINICAL DATA:  Acute onset of severe generalized abdominal pain and tingling at the feet. Question of hematuria. Initial encounter. EXAM: CT ANGIOGRAPHY CHEST, ABDOMEN AND PELVIS TECHNIQUE: Multidetector CT imaging through the chest, abdomen and pelvis was performed using the standard protocol during bolus administration of intravenous contrast. Multiplanar reconstructed images and MIPs were obtained and reviewed to evaluate the vascular anatomy. CONTRAST:  80 mL of Isovue 370 IV contrast COMPARISON:  CT of the abdomen and pelvis from 03/01/2011 FINDINGS: CTA CHEST FINDINGS There is no evidence of aortic dissection. There is no evidence of aneurysmal dilatation. The ascending thoracic aorta is borderline normal in caliber. Minimal calcification is noted along the aortic arch and descending thoracic aorta. There is no evidence of central pulmonary embolus. Mild central left basilar airspace opacity may reflect atelectasis or mild pneumonia. There is no evidence of pleural effusion or pneumothorax. No masses are identified; no abnormal focal contrast enhancement is seen. The mediastinum is unremarkable appearance, aside from diffuse coronary artery calcification. No mediastinal lymphadenopathy is seen. No pericardial effusion is identified. The great vessels are grossly unremarkable in appearance. No axillary lymphadenopathy is seen. The thyroid gland is unremarkable in appearance. No acute osseous abnormalities are seen. Review of the MIP images confirms the above findings. CTA ABDOMEN AND PELVIS FINDINGS There is no evidence of acute aortic dissection. There is no evidence of aneurysmal dilatation. The small ulcerative plaque at the distal abdominal aorta has increased mildly in size. There is mild ectasia of the abdominal aorta at this level,  measuring up to 2.4 cm in diameter. 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 This appearance is essentially unchanged from 2012, aside from perhaps slightly increased prominence of the splenic artery. The celiac trunk remains patent. The superior mesenteric artery and bilateral renal arteries are grossly unremarkable in appearance. Two left-sided renal arteries are noted. The inferior mesenteric artery is also patent. The liver and spleen are unremarkable in appearance. The patient is status post cholecystectomy, with clips noted at the gallbladder fossa. The pancreas and adrenal glands are unremarkable. There is mild right-sided hydronephrosis, which reflects a 4 mm obstructing stone at the proximal right ureter, 3-4 cm below the right renal pelvis. Right-sided perinephric stranding and fluid are noted. A 4.8 cm cyst is noted at the upper pole of the right kidney. Mild nonspecific left-sided perinephric stranding is seen. A nonobstructing 2 mm stone is noted at the lower pole of the right kidney. No free fluid is identified. The small bowel is unremarkable in appearance. The stomach is within normal limits. No acute vascular abnormalities are seen. The appendix is normal in caliber, without evidence of appendicitis. The colon is unremarkable in appearance. The bladder is mildly distended and grossly unremarkable. The prostate remains normal in size, with minimal calcification. No inguinal lymphadenopathy is seen. No acute osseous abnormalities are identified. Review of the MIP images confirms the above findings. IMPRESSION: 1. No evidence of acute aortic dissection. No evidence of aneurysmal dilatation. 2. Mild right-sided hydronephrosis, reflecting a 4 mm obstructing stone at the proximal right ureter, 3-4 cm  below the  right renal pelvis. 3. Mild central left basilar airspace opacity may reflect atelectasis or mild pneumonia. 4. 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 Mild ectasia of the abdominal aorta at this level, without evidence of aneurysmal dilatation. 5. 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 This is essentially unchanged in appearance from 2012, aside from perhaps slightly increased prominence of the splenic artery. The celiac trunk remains patent. 6. Nonobstructing 2 mm stone at the lower pole of right kidney. Right renal cyst noted. These results were called by telephone at the time of interpretation on 12/25/2015 at 3:18 am to Dr. Hinda Kehr, who verbally acknowledged these results. Electronically Signed   By: Garald Balding M.D.   On: 12/25/2015 03:18    EKG: Orders placed or performed during the hospital encounter of 12/25/15  . ED EKG within 10 minutes  . ED EKG within 10 minutes  . EKG 12-Lead  . EKG 12-Lead    IMPRESSION AND PLAN: 70 year old male patient with history of hypertension, nephrolithiasis presented with lower abdominal pain and right flank pain. Admitting diagnosis 1. Right ureteral stone 2. Flank pain secondary to ureteral stone 3. Hypertension 4. Community-acquired pneumonia Treatment plan 1. Admit patient to medical floor observation bed 2. Pain management with IV morphine 3. IV fluid hydration 4. Start patient on IV Rocephin and IV Zithromax antibiotics 5. DVT prophylaxis with sequential compression device to legs 6. Urology consultation.  All the records are reviewed and case discussed with ED provider. Management plans discussed with the patient, family and they are in agreement.  CODE STATUS:FULL Code Status  History    This patient does not have a recorded code status. Please follow your organizational policy for patients in this situation.       TOTAL TIME TAKING CARE OF THIS PATIENT: 50 minutes.    Saundra Shelling M.D on 12/25/2015 at 5:47 AM  Between 7am to 6pm - Pager - [phone]  After 6pm go to [email or web address] - password [email or web address]  Office  [phone]  CC: Primary care physician; Dion Body, MD

## 2015-12-25 NOTE — Progress Notes (Signed)
Pt pain uncontrolled d/t passing of kidney stone.  Pt given PRN Oxycodone 5 mg orally and PRN Morphine 2mg /mL IV medications without relief.  Dr. Earleen Newport notified for a one time order of pain medication for breakthrough.  Orders given and followed out (See MAR).  Will continue to monitor.

## 2015-12-25 NOTE — Progress Notes (Signed)
Pharmacy Antibiotic Note  Andre Stone is a 71 y.o. male admitted on 12/25/2015 with pneumonia.  Pharmacy has been consulted for levofloxacin dosing.  Plan: Levofloxacin 750 mg IV Q24H  Height: 6' (182.9 cm) Weight: 170 lb (77.111 kg) IBW/kg (Calculated) : 77.6  Temp (24hrs), Avg:97.7 F (36.5 C), Min:97.7 F (36.5 C), Max:97.7 F (36.5 C)   Recent Labs Lab 12/25/15 0146  WBC 11.2*  CREATININE 1.57*    Estimated Creatinine Clearance: 48.4 mL/min (by C-G formula based on Cr of 1.57).    Thank you for allowing pharmacy to be a part of this patient's care.  Laural Benes, Pharm.D., BCPS Clinical Pharmacist 12/25/2015 5:51 AM

## 2015-12-25 NOTE — ED Notes (Signed)
Per CN Trisha, 2C cannot accept pt without elevated BP being addressed. ED charge nurse aware and [facility] aware.

## 2015-12-25 NOTE — Consult Note (Signed)
Consult: right flank pain, right proximal ureteral stone Requested by: Dr. Leslye Peer  History of Present Illness:  Patient admitted with right flank pain, right proximal ureteral stone as well as hypertension and possible pneumonia. He had right flank pain and nausea and vomiting last night. CT scan revealed a 4 mm proximal right ureteral stone without hydronephrosis. There were no other stones. I reviewed all the images. He had gross hematuria but no dysuria. His UA showed too numerous to count white cells but no bacteria.  His creatinine was up to 1.57, although the patient did have nausea, received IV contrast and also complained of some diarrhea. Baseline Cr on Care everywhere with Bun 31, Cr 1.2, gfr 60.   Today, he has been well. He's had no nausea and vomiting. His pains been controlled with Percocet. He's had breakfast and lunch.  Past Medical History  Diagnosis Date  . Hypertension   . History of kidney stones    Past Surgical History  Procedure Laterality Date  . Cholecystectomy    . Tonsillectomy      Home Medications:  Prescriptions prior to admission  Medication Sig Dispense Refill Last Dose  . aspirin EC 81 MG tablet Take 81 mg by mouth daily.   12/24/2015 at 0800  . azithromycin (ZITHROMAX) 250 MG tablet Take 1 tablet by mouth daily.   12/24/2015 at Unknown time  . chlorpheniramine-HYDROcodone (TUSSIONEX) 10-8 MG/5ML SUER Take 5 mLs by mouth 2 (two) times daily.   12/24/2015 at Unknown time  . cholecalciferol (VITAMIN D) 1000 units tablet Take 2,000 Units by mouth daily.   12/24/2015 at Unknown time  . hydrochlorothiazide (HYDRODIURIL) 25 MG tablet Take 25 mg by mouth daily.   12/24/2015 at Unknown time  . lisinopril (PRINIVIL,ZESTRIL) 40 MG tablet Take 40 mg by mouth daily.   12/24/2015 at Unknown time  . Melatonin-Pyridoxine (MELATIN) 3-1 MG TABS Take 3 mg by mouth at bedtime as needed.   12/24/2015 at Unknown time  . Multiple Vitamins-Minerals (MULTIVITAMIN WITH MINERALS) tablet Take  1 tablet by mouth daily.   12/24/2015 at Unknown time  . Omega-3 Fatty Acids (FISH OIL) 1000 MG CAPS Take 1,000 mg by mouth daily.   12/24/2015 at Unknown time  . predniSONE (DELTASONE) 5 MG tablet Take 1 tablet by mouth as directed. 6 day taper   12/24/2015 at Unknown time  . zolpidem (AMBIEN) 5 MG tablet Take 5 mg by mouth at bedtime as needed for sleep (pt takes 0.5 tablet prn).   Past Week at prn   Allergies: PCN  History reviewed. No pertinent family history. Social History:  reports that he has never smoked. He does not have any smokeless tobacco history on file. He reports that he drinks alcohol. He reports that he does not use illicit drugs.  ROS: A complete review of systems was performed.  All systems are negative except for pertinent findings as noted. Review of Systems  All other systems reviewed and are negative.    Physical Exam:  Vital signs in last 24 hours: Temp:  [97.7 F (36.5 C)] 97.7 F (36.5 C) (04/03 0137) Pulse Rate:  [61-105] 68 (04/03 0845) Resp:  [9-26] 13 (04/03 0845) BP: (138-177)/(86-125) 141/86 mmHg (04/03 0845) SpO2:  [91 %-100 %] 95 % (04/03 0845) Weight:  [77.111 kg (170 lb)] 77.111 kg (170 lb) (04/03 0137) General:  Alert and oriented, No acute distress HEENT: Normocephalic, atraumatic Lungs: Regular rate and effort Extremities: No edema Neurologic: Grossly intact  Laboratory Data:  Results  for orders placed or performed during the hospital encounter of 12/25/15 (from the past 24 hour(s))  Lipase, blood     Status: None   Collection Time: 12/25/15  1:46 AM  Result Value Ref Range   Lipase 50 11 - 51 U/L  Comprehensive metabolic panel     Status: Abnormal   Collection Time: 12/25/15  1:46 AM  Result Value Ref Range   Sodium 135 135 - 145 mmol/L   Potassium 3.5 3.5 - 5.1 mmol/L   Chloride 102 101 - 111 mmol/L   CO2 25 22 - 32 mmol/L   Glucose, Bld 133 (H) 65 - 99 mg/dL   BUN 46 (H) 6 - 20 mg/dL   Creatinine, Ser 1.57 (H) 0.61 - 1.24 mg/dL    Calcium 9.2 8.9 - 10.3 mg/dL   Total Protein 7.7 6.5 - 8.1 g/dL   Albumin 4.4 3.5 - 5.0 g/dL   AST 23 15 - 41 U/L   ALT 29 17 - 63 U/L   Alkaline Phosphatase 61 38 - 126 U/L   Total Bilirubin 0.6 0.3 - 1.2 mg/dL   GFR calc non Af Amer 43 (L) >60 mL/min   GFR calc Af Amer 50 (L) >60 mL/min   Anion gap 8 5 - 15  CBC     Status: Abnormal   Collection Time: 12/25/15  1:46 AM  Result Value Ref Range   WBC 11.2 (H) 3.8 - 10.6 K/uL   RBC 4.31 (L) 4.40 - 5.90 MIL/uL   Hemoglobin 12.8 (L) 13.0 - 18.0 g/dL   HCT 36.7 (L) 40.0 - 52.0 %   MCV 85.0 80.0 - 100.0 fL   MCH 29.7 26.0 - 34.0 pg   MCHC 35.0 32.0 - 36.0 g/dL   RDW 13.9 11.5 - 14.5 %   Platelets 233 150 - 440 K/uL  Troponin I     Status: None   Collection Time: 12/25/15  1:46 AM  Result Value Ref Range   Troponin I <0.03 <0.031 ng/mL  Urinalysis complete, with microscopic ([facility] only)     Status: Abnormal   Collection Time: 12/25/15  2:00 AM  Result Value Ref Range   Color, Urine AMBER (A) YELLOW   APPearance CLOUDY (A) CLEAR   Glucose, UA NEGATIVE NEGATIVE mg/dL   Bilirubin Urine NEGATIVE NEGATIVE   Ketones, ur NEGATIVE NEGATIVE mg/dL   Specific Gravity, Urine 1.025 1.005 - 1.030   Hgb urine dipstick 3+ (A) NEGATIVE   pH 5.0 5.0 - 8.0   Protein, ur 100 (A) NEGATIVE mg/dL   Nitrite NEGATIVE NEGATIVE   Leukocytes, UA NEGATIVE NEGATIVE   RBC / HPF TOO NUMEROUS TO COUNT 0 - 5 RBC/hpf   WBC, UA 6-30 0 - 5 WBC/hpf   Bacteria, UA NONE SEEN NONE SEEN   Squamous Epithelial / LPF 0-5 (A) NONE SEEN   Mucous PRESENT    Budding Yeast PRESENT    No results found for this or any previous visit (from the past 240 hour(s)). Creatinine:  Recent Labs  12/25/15 0146  CREATININE 1.57*    Impression/Assessment/plan: Right proximal ureteral stone, 4 mm - patient stable. I discussed with him the nature risks, and benefits of continued stone passage, off label use of alpha blockers, ureteral stent placement with possible ureteroscopy. All  questions answered. He elected to continue stone passage and I think this is appropriate. Would recommend starting on tamsulosin. Patient stable for D/C from urologic point of view and we can arrange follow-up at [facility].  Kyrie Bun 12/25/2015, 1:01 PM

## 2015-12-25 NOTE — Progress Notes (Signed)
Patient ID: Andre Stone, male   DOB: 1946-08-04, 71 y.o.   MRN: [id number] Called with elevated BP in ER. Need to control pain. Bp likely elevated with pain Also release sign and held orders with his bp meds.  Dr Leslye Peer

## 2015-12-25 NOTE — Progress Notes (Signed)
Patient ID: Andre Stone, male   DOB: [birth year], 70 y.o.   MRN: [id number] [facility] PROGRESS NOTE  Andre Stone [id number] DOB: 01/31/1946 DOA: 12/25/2015 PCP: Dion Body, MD  HPI/Subjective: Patient seen earlier in the day. He was feeling a lot better than he was last night. Last night around 12 midnight severe pain. Was found to have a kidney stone. He is passing some small gravel in the urine.  Objective: Filed Vitals:   12/25/15 0830 12/25/15 0845  BP: 157/108 141/86  Pulse: 66 68  Temp:    Resp: 14 13    Filed Weights   12/25/15 0137  Weight: 77.111 kg (170 lb)    ROS: Review of Systems  Constitutional: Negative for fever and chills.  Eyes: Negative for blurred vision.  Respiratory: Negative for cough and shortness of breath.   Cardiovascular: Negative for chest pain.  Gastrointestinal: Positive for abdominal pain. Negative for nausea, vomiting, diarrhea and constipation.  Genitourinary: Negative for dysuria.  Musculoskeletal: Negative for joint pain.  Neurological: Negative for dizziness and headaches.   Exam: Physical Exam  Constitutional: He is oriented to person, place, and time.  HENT:  Nose: No mucosal edema.  Mouth/Throat: No oropharyngeal exudate or posterior oropharyngeal edema.  Eyes: Conjunctivae, EOM and lids are normal. Pupils are equal, round, and reactive to light.  Neck: No JVD present. Carotid bruit is not present. No edema present. No thyroid mass and no thyromegaly present.  Cardiovascular: S1 normal and S2 normal.  Exam reveals no gallop.   No murmur heard. Pulses:      Dorsalis pedis pulses are 2+ on the right side, and 2+ on the left side.  Respiratory: No respiratory distress. He has no wheezes. He has no rhonchi. He has no rales.  GI: Soft. Bowel sounds are normal. There is generalized tenderness.  Musculoskeletal:       Right ankle: He exhibits no swelling.       Left ankle: He exhibits no swelling.   Lymphadenopathy:    He has no cervical adenopathy.  Neurological: He is alert and oriented to person, place, and time. No cranial nerve deficit.  Skin: Skin is warm. No rash noted. Nails show no clubbing.  Psychiatric: He has a normal mood and affect.      Data Reviewed: Basic Metabolic Panel:  Recent Labs Lab 12/25/15 0146  NA 135  K 3.5  CL 102  CO2 25  GLUCOSE 133*  BUN 46*  CREATININE 1.57*  CALCIUM 9.2   Liver Function Tests:  Recent Labs Lab 12/25/15 0146  AST 23  ALT 29  ALKPHOS 61  BILITOT 0.6  PROT 7.7  ALBUMIN 4.4    Recent Labs Lab 12/25/15 0146  LIPASE 50   CBC:  Recent Labs Lab 12/25/15 0146  WBC 11.2*  HGB 12.8*  HCT 36.7*  MCV 85.0  PLT 233   Cardiac Enzymes:  Recent Labs Lab 12/25/15 0146  TROPONINI <0.03    Studies: Ct Angio Chest Aorta W/cm &/or Wo/cm  12/25/2015  CLINICAL DATA:  Acute onset of severe generalized abdominal pain and tingling at the feet. Question of hematuria. Initial encounter. EXAM: CT ANGIOGRAPHY CHEST, ABDOMEN AND PELVIS TECHNIQUE: Multidetector CT imaging through the chest, abdomen and pelvis was performed using the standard protocol during bolus administration of intravenous contrast. Multiplanar reconstructed images and MIPs were obtained and reviewed to evaluate the vascular anatomy. CONTRAST:  80 mL of Isovue 370 IV contrast COMPARISON:  CT of the abdomen  and pelvis from 03/01/2011 FINDINGS: CTA CHEST FINDINGS There is no evidence of aortic dissection. There is no evidence of aneurysmal dilatation. The ascending thoracic aorta is borderline normal in caliber. Minimal calcification is noted along the aortic arch and descending thoracic aorta. There is no evidence of central pulmonary embolus. Mild central left basilar airspace opacity may reflect atelectasis or mild pneumonia. There is no evidence of pleural effusion or pneumothorax. No masses are identified; no abnormal focal contrast enhancement is seen. The  mediastinum is unremarkable appearance, aside from diffuse coronary artery calcification. No mediastinal lymphadenopathy is seen. No pericardial effusion is identified. The great vessels are grossly unremarkable in appearance. No axillary lymphadenopathy is seen. The thyroid gland is unremarkable in appearance. No acute osseous abnormalities are seen. Review of the MIP images confirms the above findings. CTA ABDOMEN AND PELVIS FINDINGS There is no evidence of acute aortic dissection. There is no evidence of aneurysmal dilatation. The small ulcerative plaque at the distal abdominal aorta has increased mildly in size. There is mild ectasia of the abdominal aorta at this level, measuring up to 2.4 cm in diameter. 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 This appearance is essentially unchanged from 2012, aside from perhaps slightly increased prominence of the splenic artery. The celiac trunk remains patent. The superior mesenteric artery and bilateral renal arteries are grossly unremarkable in appearance. Two left-sided renal arteries are noted. The inferior mesenteric artery is also patent. The liver and spleen are unremarkable in appearance. The patient is status post cholecystectomy, with clips noted at the gallbladder fossa. The pancreas and adrenal glands are unremarkable. There is mild right-sided hydronephrosis, which reflects a 4 mm obstructing stone at the proximal right ureter, 3-4 cm below the right renal pelvis. Right-sided perinephric stranding and fluid are noted. A 4.8 cm cyst is noted at the upper pole of the right kidney. Mild nonspecific left-sided perinephric stranding is seen. A nonobstructing 2 mm stone is noted at the lower pole of the right kidney. No free fluid is identified. The small  bowel is unremarkable in appearance. The stomach is within normal limits. No acute vascular abnormalities are seen. The appendix is normal in caliber, without evidence of appendicitis. The colon is unremarkable in appearance. The bladder is mildly distended and grossly unremarkable. The prostate remains normal in size, with minimal calcification. No inguinal lymphadenopathy is seen. No acute osseous abnormalities are identified. Review of the MIP images confirms the above findings. IMPRESSION: 1. No evidence of acute aortic dissection. No evidence of aneurysmal dilatation. 2. Mild right-sided hydronephrosis, reflecting a 4 mm obstructing stone at the proximal right ureter, 3-4 cm below the right renal pelvis. 3. Mild central left basilar airspace opacity may reflect atelectasis or mild pneumonia. 4. 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 Mild ectasia of the abdominal aorta at this level, without evidence of aneurysmal dilatation. 5. 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 This is essentially unchanged in appearance from 2012, aside from perhaps slightly increased prominence of the splenic artery. The celiac trunk remains patent. 6. Nonobstructing 2 mm stone at  the lower pole of right kidney. Right renal cyst noted. These results were called by telephone at the time of interpretation on 12/25/2015 at 3:18 am to Dr. Hinda Kehr, who verbally acknowledged these results. Electronically Signed   By: Garald Balding M.D.   On: 12/25/2015 03:18   Ct Cta Abd/pel W/cm &/or W/o Cm  12/25/2015  CLINICAL DATA:  Acute onset of severe generalized abdominal pain and tingling at the feet. Question of hematuria. Initial encounter. EXAM: CT ANGIOGRAPHY CHEST, ABDOMEN AND PELVIS TECHNIQUE:  Multidetector CT imaging through the chest, abdomen and pelvis was performed using the standard protocol during bolus administration of intravenous contrast. Multiplanar reconstructed images and MIPs were obtained and reviewed to evaluate the vascular anatomy. CONTRAST:  80 mL of Isovue 370 IV contrast COMPARISON:  CT of the abdomen and pelvis from 03/01/2011 FINDINGS: CTA CHEST FINDINGS There is no evidence of aortic dissection. There is no evidence of aneurysmal dilatation. The ascending thoracic aorta is borderline normal in caliber. Minimal calcification is noted along the aortic arch and descending thoracic aorta. There is no evidence of central pulmonary embolus. Mild central left basilar airspace opacity may reflect atelectasis or mild pneumonia. There is no evidence of pleural effusion or pneumothorax. No masses are identified; no abnormal focal contrast enhancement is seen. The mediastinum is unremarkable appearance, aside from diffuse coronary artery calcification. No mediastinal lymphadenopathy is seen. No pericardial effusion is identified. The great vessels are grossly unremarkable in appearance. No axillary lymphadenopathy is seen. The thyroid gland is unremarkable in appearance. No acute osseous abnormalities are seen. Review of the MIP images confirms the above findings. CTA ABDOMEN AND PELVIS FINDINGS There is no evidence of acute aortic dissection. There is no evidence of aneurysmal dilatation. The small ulcerative plaque at the distal abdominal aorta has increased mildly in size. There is mild ectasia of the abdominal aorta at this level, measuring up to 2.4 cm in diameter. 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 This appearance is essentially unchanged from 2012, aside from perhaps slightly  increased prominence of the splenic artery. The celiac trunk remains patent. The superior mesenteric artery and bilateral renal arteries are grossly unremarkable in appearance. Two left-sided renal arteries are noted. The inferior mesenteric artery is also patent. The liver and spleen are unremarkable in appearance. The patient is status post cholecystectomy, with clips noted at the gallbladder fossa. The pancreas and adrenal glands are unremarkable. There is mild right-sided hydronephrosis, which reflects a 4 mm obstructing stone at the proximal right ureter, 3-4 cm below the right renal pelvis. Right-sided perinephric stranding and fluid are noted. A 4.8 cm cyst is noted at the upper pole of the right kidney. Mild nonspecific left-sided perinephric stranding is seen. A nonobstructing 2 mm stone is noted at the lower pole of the right kidney. No free fluid is identified. The small bowel is unremarkable in appearance. The stomach is within normal limits. No acute vascular abnormalities are seen. The appendix is normal in caliber, without evidence of appendicitis. The colon is unremarkable in appearance. The bladder is mildly distended and grossly unremarkable. The prostate remains normal in size, with minimal calcification. No inguinal lymphadenopathy is seen. No acute osseous abnormalities are identified. Review of the MIP images confirms the above findings. IMPRESSION: 1. No evidence of acute  aortic dissection. No evidence of aneurysmal dilatation. 2. Mild right-sided hydronephrosis, reflecting a 4 mm obstructing stone at the proximal right ureter, 3-4 cm below the right renal pelvis. 3. Mild central left basilar airspace opacity may reflect atelectasis or mild pneumonia. 4. 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 Mild ectasia of the abdominal aorta at this level, without evidence of aneurysmal dilatation. 5. 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 This is essentially unchanged in appearance from 2012, aside from perhaps slightly increased prominence of the splenic artery. The celiac trunk remains patent. 6. Nonobstructing 2 mm stone at the lower pole of right kidney. Right renal cyst noted. These results were called by telephone at the time of interpretation on 12/25/2015 at 3:18 am to Dr. Hinda Kehr, who verbally acknowledged these results. Electronically Signed   By: Garald Balding M.D.   On: 12/25/2015 03:18    Scheduled Meds: . aspirin EC  81 mg Oral Daily  . chlorpheniramine-HYDROcodone  5 mL Oral BID  . cholecalciferol  2,000 Units Oral Daily  . docusate sodium  100 mg Oral BID  . [START ON 12/26/2015] levofloxacin (LEVAQUIN) IV  750 mg Intravenous Q24H  . lisinopril  40 mg Oral Daily  . multivitamin with minerals  1 tablet Oral Daily  . omega-3 acid ethyl esters  1 g Oral Daily  . tamsulosin  0.4 mg Oral QPC supper   Continuous Infusions: . sodium chloride 75 mL/hr at 12/25/15 1031    Assessment/Plan:  1. Abdominal pain, 4 mm obstructing stone right ureter with mild right hydronephrosis. Appreciate urology consultation. Likely discharge tomorrow. Continue IV fluids. Follow up urine culture. Continue empiric Levaquin. Strain urine. Start Flomax. 2. Essential hypertension. Continue lisinopril 3. Pneumonia seen on CT scan. Levaquin will cover. Patient was on outpatient steroid and Zithromax.  Code Status:     Code Status Orders        Start     Ordered   12/25/15 0922  Full code   Continuous     12/25/15 0921    Code Status History    Date Active Date Inactive Code Status Order ID Comments User Context   This patient has a current code status but no historical code status.     Disposition Plan: Likely home tomorrow  morning  Consultants:  Urology  Antibiotics:  Levaquin  Time spent: 30 minutes  Loletha Grayer  [facility]

## 2015-12-25 NOTE — Progress Notes (Signed)
PRN morphine 2 mg/ml was given to pt at 2214 for a pain level of 9 out of 10. Pt is having flank pain that is very intense. Pt expressed to RN that just 2 mg/ml does not work for his intense pain. RN called Doctor Melynda Ripple about pt concern of pain medication and new orders to give morphine 2 mg/ml one time dose due to intense flank pain. Will continue to monitor pt.   Andre Stone

## 2015-12-25 NOTE — ED Notes (Signed)
Called hospitalist on call, Dr. Bobbye Charleston. Told to release pts BP medicine - lisinopril - and to get pts pain under control.

## 2015-12-25 NOTE — Progress Notes (Signed)
Received pt from ED to Rm 212.   Pt AOx4. VSS (See Flowsheet). No signs of acute distress. IV access intact and NS running. Medications administered (See MAR). Admission completed. Assessment completed. DVT prophylaxis assessed and completed.   Education provided on call bell, bed alarm, telephone and IV/IV Pole/IV Alarms. 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 White Board was completed and updated PRN.   Dietary specification confirmed.   Wife at bedside. Pt resting comfortably and quietly w/bed in low and locked position and call bell and telephone within reach.  Pt voiding in urinal.  Pt and wife have been educated not to dump urine in toilet.  Urine is being strained.  Noticeable sediment/very tiny stones noted in stainer.  Urine culture sent.  Pain managed at this time 1/10 on numerical scale.   Will continue to monitor.

## 2015-12-26 DIAGNOSIS — N200 Calculus of kidney: Secondary | ICD-10-CM

## 2015-12-26 LAB — CBC
HEMATOCRIT: 36.6 % — AB (ref 40.0–52.0)
Hemoglobin: 12.3 g/dL — ABNORMAL LOW (ref 13.0–18.0)
MCH: 29.2 pg (ref 26.0–34.0)
MCHC: 33.7 g/dL (ref 32.0–36.0)
MCV: 86.7 fL (ref 80.0–100.0)
Platelets: 186 10*3/uL (ref 150–440)
RBC: 4.22 MIL/uL — ABNORMAL LOW (ref 4.40–5.90)
RDW: 13.9 % (ref 11.5–14.5)
WBC: 12.5 10*3/uL — AB (ref 3.8–10.6)

## 2015-12-26 LAB — BASIC METABOLIC PANEL
Anion gap: 8 (ref 5–15)
BUN: 31 mg/dL — AB (ref 6–20)
CHLORIDE: 100 mmol/L — AB (ref 101–111)
CO2: 26 mmol/L (ref 22–32)
Calcium: 8.8 mg/dL — ABNORMAL LOW (ref 8.9–10.3)
Creatinine, Ser: 1.71 mg/dL — ABNORMAL HIGH (ref 0.61–1.24)
GFR calc Af Amer: 45 mL/min — ABNORMAL LOW (ref >60)
GFR calc non Af Amer: 39 mL/min — ABNORMAL LOW (ref >60)
Glucose, Bld: 141 mg/dL — ABNORMAL HIGH (ref 65–99)
POTASSIUM: 3.5 mmol/L (ref 3.5–5.1)
SODIUM: 134 mmol/L — AB (ref 135–145)

## 2015-12-26 LAB — URINE CULTURE
Culture: NO GROWTH
Special Requests: NORMAL

## 2015-12-26 MED ORDER — OXYCODONE HCL 5 MG PO TABS: 5.0000 mg | ORAL_TABLET | Freq: Four times a day (QID) | ORAL | Status: DC | PRN

## 2015-12-26 MED ORDER — LEVOFLOXACIN 750 MG PO TABS: 750.0000 mg | ORAL_TABLET | Freq: Every day | ORAL | Status: DC

## 2015-12-26 MED ORDER — LEVOFLOXACIN IN D5W 750 MG/150ML IV SOLN: 750.0000 mg | INTRAVENOUS | Status: DC

## 2015-12-26 MED ORDER — HYDROMORPHONE HCL 1 MG/ML IJ SOLN
2.0000 mg | Freq: Once | INTRAMUSCULAR | Status: AC
  Administered 2015-12-26: 2 mg via INTRAVENOUS
  Filled 2015-12-26: qty 1
  Filled 2015-12-26: qty 2

## 2015-12-26 MED ORDER — HYDROMORPHONE HCL 1 MG/ML IJ SOLN: 1.0000 mg | INTRAMUSCULAR | Status: DC | PRN

## 2015-12-26 MED ORDER — TAMSULOSIN HCL 0.4 MG PO CAPS: 0.4000 mg | ORAL_CAPSULE | Freq: Every day | ORAL | Status: DC

## 2015-12-26 NOTE — Discharge Summary (Signed)
[facility] NAME: Andre Stone    MR#:  [id number]  DATE OF BIRTH:  [birth year]  DATE OF ADMISSION:  12/25/2015 ADMITTING PHYSICIAN: Saundra Shelling, MD  DATE OF DISCHARGE: 12/26/2015  PRIMARY CARE PHYSICIAN: Dion Body, MD    ADMISSION DIAGNOSIS:  Ureterolithiasis [N20.1] CAP (community acquired pneumonia) [J18.9] Hydronephrosis of right kidney [N13.30] Intractable abdominal pain [R10.9]  DISCHARGE DIAGNOSIS:  Principal Problem:   Kidney stone Active Problems:   Community acquired pneumonia   Pneumonia   SECONDARY DIAGNOSIS:   Past Medical History  Diagnosis Date  . Hypertension   . History of kidney stones     HOSPITAL COURSE:   1. Abdominal pain, urethral stone, mild hydronephrosis, acute renal insufficiency. CT scan showing a 4 mm right urethral stone. Patient was seen in consultation by urology. Medical management was recommended with IV fluid hydration and pain control and Flomax. Urine culture sent off. Empiric Levaquin given. Patient had a lot of pain on the evening of 4 10/27/2015. He had some pain in the morning of 12/26/2015. By the afternoon on 12/26/2015 he was feeling much better and has not required any pain medications. Urology felt that he was stable to go home and follow-up as outpatient. I prescribed oral pain medications upon discharge just in case. I advised oral hydration upon going home. IV fluid hydration was given during the hospital course. Neurology thinks he may of pass the stone into the bladder. Follow-up with urology as outpatient 2. Acute renal insufficiency. Patient was given IV fluid hydration during the hospital course. I am holding lisinopril and hydrochlorothiazide until follow-up appointment and recheck BMP. Renal insufficiency should improve secondary to the stone being passed. 3. Pneumonia seen on CT scan. Patient was on prednisone and Zithromax as outpatient. Antibiotics  switched over to Levaquin. 4. Essential hypertension hold lisinopril and hydrochlorothiazide until follow-up appointment   DISCHARGE CONDITIONS:   Satisfactory  CONSULTS OBTAINED:  Treatment Team:  Festus Aloe, MD  DRUG ALLERGIES:   Penicillin  DISCHARGE MEDICATIONS:   Current Discharge Medication List    START taking these medications   Details  levofloxacin (LEVAQUIN) 750 MG tablet Take 1 tablet (750 mg total) by mouth daily. Qty: 3 tablet, Refills: 0    oxyCODONE (OXY IR/ROXICODONE) 5 MG immediate release tablet Take 1 tablet (5 mg total) by mouth every 6 (six) hours as needed for moderate pain, severe pain or breakthrough pain. Qty: 30 tablet, Refills: 0    tamsulosin (FLOMAX) 0.4 MG CAPS capsule Take 1 capsule (0.4 mg total) by mouth daily after supper. Qty: 30 capsule, Refills: 0      CONTINUE these medications which have NOT CHANGED   Details  aspirin EC 81 MG tablet Take 81 mg by mouth daily.    chlorpheniramine-HYDROcodone (TUSSIONEX) 10-8 MG/5ML SUER Take 5 mLs by mouth 2 (two) times daily.    cholecalciferol (VITAMIN D) 1000 units tablet Take 2,000 Units by mouth daily.    Melatonin-Pyridoxine (MELATIN) 3-1 MG TABS Take 3 mg by mouth at bedtime as needed.    Multiple Vitamins-Minerals (MULTIVITAMIN WITH MINERALS) tablet Take 1 tablet by mouth daily.    Omega-3 Fatty Acids (FISH OIL) 1000 MG CAPS Take 1,000 mg by mouth daily.    zolpidem (AMBIEN) 5 MG tablet Take 5 mg by mouth at bedtime as needed for sleep (pt takes 0.5 tablet prn).      STOP taking these medications     azithromycin (ZITHROMAX) 250 MG  tablet      hydrochlorothiazide (HYDRODIURIL) 25 MG tablet      lisinopril (PRINIVIL,ZESTRIL) 40 MG tablet      predniSONE (DELTASONE) 5 MG tablet          DISCHARGE INSTRUCTIONS:   Satisfactory  If you experience worsening of your admission symptoms, develop shortness of breath, life threatening emergency, suicidal or homicidal  thoughts you must seek medical attention immediately by calling 911 or calling your MD immediately  if symptoms less severe.  You Must read complete instructions/literature along with all the possible adverse reactions/side effects for all the Medicines you take and that have been prescribed to you. Take any new Medicines after you have completely understood and accept all the possible adverse reactions/side effects.   Please note  You were cared for by a hospitalist during your hospital stay. If you have any questions about your discharge medications or the care you received while you were in the hospital after you are discharged, you can call the unit and asked to speak with the hospitalist on call if the hospitalist that took care of you is not available. Once you are discharged, your primary care physician will handle any further medical issues. Please note that NO REFILLS for any discharge medications will be authorized once you are discharged, as it is imperative that you return to your primary care physician (or establish a relationship with a primary care physician if you do not have one) for your aftercare needs so that they can reassess your need for medications and monitor your lab values.    Today   CHIEF COMPLAINT:   Chief Complaint  Patient presents with  . Abdominal Pain    HISTORY OF PRESENT ILLNESS:  Rydin Braden  is a 70 y.o. male presented with abdominal pain and found to have a right kidney stone   VITAL SIGNS:  Blood pressure 130/79, pulse 72, temperature 97.8 F (36.6 C), temperature source Oral, resp. rate 18, height 6' (1.829 m), weight 77.111 kg (170 lb), SpO2 98 %.    PHYSICAL EXAMINATION:  GENERAL:  70 y.o.-year-old patient lying in the bed with no acute distress.  EYES: Pupils equal, round, reactive to light and accommodation. No scleral icterus. Extraocular muscles intact.  HEENT: Head atraumatic, normocephalic. Oropharynx and nasopharynx clear.   NECK:  Supple, no jugular venous distention. No thyroid enlargement, no tenderness.  LUNGS: Normal breath sounds bilaterally, no wheezing, rales,rhonchi or crepitation. No use of accessory muscles of respiration.  CARDIOVASCULAR: S1, S2 normal. No murmurs, rubs, or gallops.  ABDOMEN: Soft, slight lower abdominal tenderness, non-distended. Bowel sounds present. No organomegaly or mass.  EXTREMITIES: No pedal edema, cyanosis, or clubbing.  NEUROLOGIC: Cranial nerves II through XII are intact. Muscle strength 5/5 in all extremities. Sensation intact. Gait not checked.  PSYCHIATRIC: The patient is alert and oriented x 3.  SKIN: No obvious rash, lesion, or ulcer.   DATA REVIEW:   CBC  Recent Labs Lab 12/26/15 0431  WBC 12.5*  HGB 12.3*  HCT 36.6*  PLT 186    Chemistries   Recent Labs Lab 12/25/15 0146 12/26/15 0431  NA 135 134*  K 3.5 3.5  CL 102 100*  CO2 25 26  GLUCOSE 133* 141*  BUN 46* 31*  CREATININE 1.57* 1.71*  CALCIUM 9.2 8.8*  AST 23  --   ALT 29  --   ALKPHOS 61  --   BILITOT 0.6  --     Cardiac Enzymes  Recent Labs Lab  12/25/15 0146  TROPONINI <0.03    Microbiology Results  Results for orders placed or performed during the hospital encounter of 12/25/15  Urine culture     Status: None   Collection Time: 12/25/15 11:30 AM  Result Value Ref Range Status   Specimen Description URINE, CLEAN CATCH  Final   Special Requests Normal  Final   Culture NO GROWTH 1 DAY  Final   Report Status 12/26/2015 FINAL  Final    RADIOLOGY:  Ct Angio Chest Aorta W/cm &/or Wo/cm  12/25/2015  CLINICAL DATA:  Acute onset of severe generalized abdominal pain and tingling at the feet. Question of hematuria. Initial encounter. EXAM: CT ANGIOGRAPHY CHEST, ABDOMEN AND PELVIS TECHNIQUE: Multidetector CT imaging through the chest, abdomen and pelvis was performed using the standard protocol during bolus administration of intravenous contrast. Multiplanar reconstructed images and  MIPs were obtained and reviewed to evaluate the vascular anatomy. CONTRAST:  80 mL of Isovue 370 IV contrast COMPARISON:  CT of the abdomen and pelvis from 03/01/2011 FINDINGS: CTA CHEST FINDINGS There is no evidence of aortic dissection. There is no evidence of aneurysmal dilatation. The ascending thoracic aorta is borderline normal in caliber. Minimal calcification is noted along the aortic arch and descending thoracic aorta. There is no evidence of central pulmonary embolus. Mild central left basilar airspace opacity may reflect atelectasis or mild pneumonia. There is no evidence of pleural effusion or pneumothorax. No masses are identified; no abnormal focal contrast enhancement is seen. The mediastinum is unremarkable appearance, aside from diffuse coronary artery calcification. No mediastinal lymphadenopathy is seen. No pericardial effusion is identified. The great vessels are grossly unremarkable in appearance. No axillary lymphadenopathy is seen. The thyroid gland is unremarkable in appearance. No acute osseous abnormalities are seen. Review of the MIP images confirms the above findings. CTA ABDOMEN AND PELVIS FINDINGS There is no evidence of acute aortic dissection. There is no evidence of aneurysmal dilatation. The small ulcerative plaque at the distal abdominal aorta has increased mildly in size. There is mild ectasia of the abdominal aorta at this level, measuring up to 2.4 cm in diameter. 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 This appearance is essentially unchanged from 2012, aside from perhaps slightly increased prominence of the splenic artery. The celiac trunk remains patent. The superior mesenteric artery and bilateral renal arteries are grossly unremarkable in appearance. Two left-sided  renal arteries are noted. The inferior mesenteric artery is also patent. The liver and spleen are unremarkable in appearance. The patient is status post cholecystectomy, with clips noted at the gallbladder fossa. The pancreas and adrenal glands are unremarkable. There is mild right-sided hydronephrosis, which reflects a 4 mm obstructing stone at the proximal right ureter, 3-4 cm below the right renal pelvis. Right-sided perinephric stranding and fluid are noted. A 4.8 cm cyst is noted at the upper pole of the right kidney. Mild nonspecific left-sided perinephric stranding is seen. A nonobstructing 2 mm stone is noted at the lower pole of the right kidney. No free fluid is identified. The small bowel is unremarkable in appearance. The stomach is within normal limits. No acute vascular abnormalities are seen. The appendix is normal in caliber, without evidence of appendicitis. The colon is unremarkable in appearance. The bladder is mildly distended and grossly unremarkable. The prostate remains  normal in size, with minimal calcification. No inguinal lymphadenopathy is seen. No acute osseous abnormalities are identified. Review of the MIP images confirms the above findings. IMPRESSION: 1. No evidence of acute aortic dissection. No evidence of aneurysmal dilatation. 2. Mild right-sided hydronephrosis, reflecting a 4 mm obstructing stone at the proximal right ureter, 3-4 cm below the right renal pelvis. 3. Mild central left basilar airspace opacity may reflect atelectasis or mild pneumonia. 4. 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 Mild ectasia of the abdominal aorta at this level, without evidence of aneurysmal dilatation. 5. 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 This is  essentially unchanged in appearance from 2012, aside from perhaps slightly increased prominence of the splenic artery. The celiac trunk remains patent. 6. Nonobstructing 2 mm stone at the lower pole of right kidney. Right renal cyst noted. These results were called by telephone at the time of interpretation on 12/25/2015 at 3:18 am to Dr. Hinda Kehr, who verbally acknowledged these results. Electronically Signed   By: Garald Balding M.D.   On: 12/25/2015 03:18   Ct Cta Abd/pel W/cm &/or W/o Cm  12/25/2015  CLINICAL DATA:  Acute onset of severe generalized abdominal pain and tingling at the feet. Question of hematuria. Initial encounter. EXAM: CT ANGIOGRAPHY CHEST, ABDOMEN AND PELVIS TECHNIQUE: Multidetector CT imaging through the chest, abdomen and pelvis was performed using the standard protocol during bolus administration of intravenous contrast. Multiplanar reconstructed images and MIPs were obtained and reviewed to evaluate the vascular anatomy. CONTRAST:  80 mL of Isovue 370 IV contrast COMPARISON:  CT of the abdomen and pelvis from 03/01/2011 FINDINGS: CTA CHEST FINDINGS There is no evidence of aortic dissection. There is no evidence of aneurysmal dilatation. The ascending thoracic aorta is borderline normal in caliber. Minimal calcification is noted along the aortic arch and descending thoracic aorta. There is no evidence of central pulmonary embolus. Mild central left basilar airspace opacity may reflect atelectasis or mild pneumonia. There is no evidence of pleural effusion or pneumothorax. No masses are identified; no abnormal focal contrast enhancement is seen. The mediastinum is unremarkable appearance, aside from diffuse coronary artery calcification. No mediastinal lymphadenopathy is seen. No pericardial effusion is identified. The great vessels are grossly unremarkable in appearance. No axillary lymphadenopathy is seen. The thyroid gland is unremarkable in appearance. No acute osseous abnormalities  are seen. Review of the MIP images confirms the above findings. CTA ABDOMEN AND PELVIS FINDINGS There is no evidence of acute aortic dissection. There is no evidence of aneurysmal dilatation. The small ulcerative plaque at the distal abdominal aorta has increased mildly in size. There is mild ectasia of the abdominal aorta at this level, measuring up to 2.4 cm in diameter. 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 This appearance is essentially unchanged from 2012, aside from perhaps slightly increased prominence of the splenic artery. The celiac trunk remains patent. The superior mesenteric artery and bilateral renal arteries are grossly unremarkable in appearance. Two left-sided renal arteries are noted. The inferior mesenteric artery is also patent. The liver and spleen are unremarkable in  appearance. The patient is status post cholecystectomy, with clips noted at the gallbladder fossa. The pancreas and adrenal glands are unremarkable. There is mild right-sided hydronephrosis, which reflects a 4 mm obstructing stone at the proximal right ureter, 3-4 cm below the right renal pelvis. Right-sided perinephric stranding and fluid are noted. A 4.8 cm cyst is noted at the upper pole of the right kidney. Mild nonspecific left-sided perinephric stranding is seen. A nonobstructing 2 mm stone is noted at the lower pole of the right kidney. No free fluid is identified. The small bowel is unremarkable in appearance. The stomach is within normal limits. No acute vascular abnormalities are seen. The appendix is normal in caliber, without evidence of appendicitis. The colon is unremarkable in appearance. The bladder is mildly distended and grossly unremarkable. The prostate remains normal in size, with minimal calcification.  No inguinal lymphadenopathy is seen. No acute osseous abnormalities are identified. Review of the MIP images confirms the above findings. IMPRESSION: 1. No evidence of acute aortic dissection. No evidence of aneurysmal dilatation. 2. Mild right-sided hydronephrosis, reflecting a 4 mm obstructing stone at the proximal right ureter, 3-4 cm below the right renal pelvis. 3. Mild central left basilar airspace opacity may reflect atelectasis or mild pneumonia. 4. 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 Mild ectasia of the abdominal aorta at this level, without evidence of aneurysmal dilatation. 5. 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 This is essentially unchanged in appearance from 2012, aside from perhaps slightly increased prominence of the splenic artery. The celiac trunk remains patent. 6. Nonobstructing 2 mm stone at the lower pole of right kidney. Right renal cyst noted. These results were called by telephone at the time of interpretation on 12/25/2015 at 3:18 am to Dr. Hinda Kehr, who verbally acknowledged these results. Electronically Signed   By: Garald Balding M.D.   On: 12/25/2015 03:18   Management plans discussed with the patient, and he is in agreement.  CODE STATUS:     Code Status Orders        Start     Ordered   12/25/15 0922  Full code   Continuous     12/25/15 0921    Code Status History    Date Active Date Inactive Code Status Order ID Comments User Context   This patient has a current code status but no historical code status.      TOTAL TIME TAKING CARE OF THIS PATIENT: 35 minutes.    Loletha Grayer M.D on 12/26/2015 at 5:30 PM  Between 7am to 6pm - Pager - [phone]  After 6pm go to [email or web address] - password [email or web address]  [facility]  [phone]  CC: Primary care physician; Dion Body, MD

## 2015-12-26 NOTE — Progress Notes (Signed)
Pt stated to RN that this is "the best he felt in 20 hours" after dilaudid 2 mg was given. Will continue to monitor pt.  Angus Seller

## 2015-12-26 NOTE — Progress Notes (Signed)
Pt is not getting relief from the PRN 2 mg IV of morphine  that was given at 0247 at 12/26/2015. Pt at this moment still rates his pain between a 7 and 9 out of 10. Called Doctor Pyreddy of pt complains and gave RN order, 2 mg IV dilaudid one time dose. RN suggest that pain medication gets readjusted. Will continue to monitor pt.   Angus Seller

## 2015-12-26 NOTE — Progress Notes (Signed)
Urology Consult Follow Up  Subjective: Patient states he has had a rough night.  His pain was controlled with Dilaudid this am.  He is tolerating a regular diet.  WBC's and creatinine slightly elevated, but VSS.  His pain is in the right lower quadrant this morning.    Anti-infectives: Anti-infectives    Start     Dose/Rate Route Frequency Ordered Stop   12/26/15 1000  levofloxacin (LEVAQUIN) IVPB 750 mg     750 mg 100 mL/hr over 90 Minutes Intravenous Every 24 hours 12/25/15 0921     12/25/15 1115  azithromycin (ZITHROMAX) tablet 250 mg  Status:  Discontinued     250 mg Oral  Once 12/25/15 1106 12/25/15 1107   12/25/15 0600  levofloxacin (LEVAQUIN) IVPB 750 mg     750 mg 100 mL/hr over 90 Minutes Intravenous  Once 12/25/15 0550 12/25/15 0821   12/25/15 0500  cefTRIAXone (ROCEPHIN) 1 g in dextrose 5 % 50 mL IVPB     1 g 100 mL/hr over 30 Minutes Intravenous STAT 12/25/15 0449 12/25/15 0600   12/25/15 0500  azithromycin (ZITHROMAX) 500 mg in dextrose 5 % 250 mL IVPB  Status:  Discontinued     500 mg 250 mL/hr over 60 Minutes Intravenous  Once 12/25/15 0449 12/25/15 0550      Current Facility-Administered Medications  Medication Dose Route Frequency Provider Last Rate Last Dose  . 0.9 %  sodium chloride infusion   Intravenous Continuous Saundra Shelling, MD 75 mL/hr at 12/26/15 0531    . acetaminophen (TYLENOL) tablet 650 mg  650 mg Oral Q6H PRN Saundra Shelling, MD   650 mg at 12/26/15 0840   Or  . acetaminophen (TYLENOL) suppository 650 mg  650 mg Rectal Q6H PRN Saundra Shelling, MD      . aspirin EC tablet 81 mg  81 mg Oral Daily Pavan Pyreddy, MD   81 mg at 12/25/15 1131  . chlorpheniramine-HYDROcodone (TUSSIONEX) 10-8 MG/5ML suspension 5 mL  5 mL Oral BID Saundra Shelling, MD   Stopped at 12/25/15 2122  . cholecalciferol (VITAMIN D) tablet 2,000 Units  2,000 Units Oral Daily Saundra Shelling, MD   2,000 Units at 12/25/15 1131  . docusate sodium (COLACE) capsule 100 mg  100 mg Oral BID Saundra Shelling, MD   100 mg at 12/25/15 2122  . HYDROmorphone (DILAUDID) injection 1 mg  1 mg Intravenous Q3H PRN Loletha Grayer, MD      . levofloxacin (LEVAQUIN) IVPB 750 mg  750 mg Intravenous Q24H Pavan Pyreddy, MD      . multivitamin with minerals tablet 1 tablet  1 tablet Oral Daily Saundra Shelling, MD   1 tablet at 12/25/15 1501  . omega-3 acid ethyl esters (LOVAZA) capsule 1 g  1 g Oral Daily Saundra Shelling, MD   1 g at 12/25/15 1502  . ondansetron (ZOFRAN) tablet 4 mg  4 mg Oral Q6H PRN Saundra Shelling, MD       Or  . ondansetron (ZOFRAN) injection 4 mg  4 mg Intravenous Q6H PRN Saundra Shelling, MD   4 mg at 12/26/15 0526  . oxyCODONE (Oxy IR/ROXICODONE) immediate release tablet 5 mg  5 mg Oral Q6H PRN Loletha Grayer, MD   5 mg at 12/26/15 0840  . pantoprazole (PROTONIX) EC tablet 40 mg  40 mg Oral Daily Loletha Grayer, MD   40 mg at 12/25/15 1502  . predniSONE (DELTASONE) tablet 5 mg  5 mg Oral Once Sylvan Cheese, MD      .  tamsulosin (FLOMAX) capsule 0.4 mg  0.4 mg Oral QPC supper Loletha Grayer, MD   0.4 mg at 12/25/15 1843  . zolpidem (AMBIEN) tablet 5 mg  5 mg Oral QHS PRN Saundra Shelling, MD         Objective: Vital signs in last 24 hours: Temp:  [97.3 F (36.3 C)-98.2 F (36.8 C)] 97.3 F (36.3 C) (04/04 0654) Pulse Rate:  [68-82] 80 (04/04 0654) Resp:  [16] 16 (04/04 0654) BP: (129-172)/(78-98) 129/83 mmHg (04/04 0654) SpO2:  [95 %] 95 % (04/04 0654)  Intake/Output from previous day: 04/03 0701 - 04/04 0700 In: 7793 [P.O.:240; I.V.:1541] Out: 9030 [Urine:1650] Intake/Output this shift: Total I/O In: 50 [P.O.:50] Out: 250 [Urine:250]   Physical Exam Constitutional: Well nourished. Alert and oriented, No acute distress. HEENT: [place] AT, moist mucus membranes. Trachea midline, no masses. Cardiovascular: No clubbing, cyanosis, or edema. Respiratory: Normal respiratory effort, no increased work of breathing. GI: Abdomen is soft, non tender, non distended, no abdominal  masses.  Skin: No rashes, bruises or suspicious lesions. Lymph: No cervical or inguinal adenopathy. Neurologic: Grossly intact, no focal deficits, moving all 4 extremities. Psychiatric: Normal mood and affect.  Lab Results:   Recent Labs  12/25/15 0146 12/26/15 0431  WBC 11.2* 12.5*  HGB 12.8* 12.3*  HCT 36.7* 36.6*  PLT 233 186   BMET  Recent Labs  12/25/15 0146 12/26/15 0431  NA 135 134*  K 3.5 3.5  CL 102 100*  CO2 25 26  GLUCOSE 133* 141*  BUN 46* 31*  CREATININE 1.57* 1.71*  CALCIUM 9.2 8.8*   PT/INR No results for input(s): LABPROT, INR in the last 72 hours. ABG No results for input(s): PHART, HCO3 in the last 72 hours.  Invalid input(s): PCO2, PO2  Studies/Results: Ct Angio Chest Aorta W/cm &/or Wo/cm  12/25/2015  CLINICAL DATA:  Acute onset of severe generalized abdominal pain and tingling at the feet. Question of hematuria. Initial encounter. EXAM: CT ANGIOGRAPHY CHEST, ABDOMEN AND PELVIS TECHNIQUE: Multidetector CT imaging through the chest, abdomen and pelvis was performed using the standard protocol during bolus administration of intravenous contrast. Multiplanar reconstructed images and MIPs were obtained and reviewed to evaluate the vascular anatomy. CONTRAST:  80 mL of Isovue 370 IV contrast COMPARISON:  CT of the abdomen and pelvis from 03/01/2011 FINDINGS: CTA CHEST FINDINGS There is no evidence of aortic dissection. There is no evidence of aneurysmal dilatation. The ascending thoracic aorta is borderline normal in caliber. Minimal calcification is noted along the aortic arch and descending thoracic aorta. There is no evidence of central pulmonary embolus. Mild central left basilar airspace opacity may reflect atelectasis or mild pneumonia. There is no evidence of pleural effusion or pneumothorax. No masses are identified; no abnormal focal contrast enhancement is seen. The mediastinum is unremarkable appearance, aside from diffuse coronary artery  calcification. No mediastinal lymphadenopathy is seen. No pericardial effusion is identified. The great vessels are grossly unremarkable in appearance. No axillary lymphadenopathy is seen. The thyroid gland is unremarkable in appearance. No acute osseous abnormalities are seen. Review of the MIP images confirms the above findings. CTA ABDOMEN AND PELVIS FINDINGS There is no evidence of acute aortic dissection. There is no evidence of aneurysmal dilatation. The small ulcerative plaque at the distal abdominal aorta has increased mildly in size. There is mild ectasia of the abdominal aorta at this level, measuring up to 2.4 cm in diameter. 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 This appearance is essentially unchanged from 2012, aside from perhaps slightly increased prominence of the splenic artery. The celiac trunk remains patent. The superior mesenteric artery and bilateral renal arteries are grossly unremarkable in appearance. Two left-sided renal arteries are noted. The inferior mesenteric artery is also patent. The liver and spleen are unremarkable in appearance. The patient is status post cholecystectomy, with clips noted at the gallbladder fossa. The pancreas and adrenal glands are unremarkable. There is mild right-sided hydronephrosis, which reflects a 4 mm obstructing stone at the proximal right ureter, 3-4 cm below the right renal pelvis. Right-sided perinephric stranding and fluid are noted. A 4.8 cm cyst is noted at the upper pole of the right kidney. Mild nonspecific left-sided perinephric stranding is seen. A nonobstructing 2 mm stone is noted at the lower pole of the right kidney. No free fluid is identified. The small bowel is unremarkable in appearance. The stomach is within normal limits. No  acute vascular abnormalities are seen. The appendix is normal in caliber, without evidence of appendicitis. The colon is unremarkable in appearance. The bladder is mildly distended and grossly unremarkable. The prostate remains normal in size, with minimal calcification. No inguinal lymphadenopathy is seen. No acute osseous abnormalities are identified. Review of the MIP images confirms the above findings. IMPRESSION: 1. No evidence of acute aortic dissection. No evidence of aneurysmal dilatation. 2. Mild right-sided hydronephrosis, reflecting a 4 mm obstructing stone at the proximal right ureter, 3-4 cm below the right renal pelvis. 3. Mild central left basilar airspace opacity may reflect atelectasis or mild pneumonia. 4. 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 Mild ectasia of the abdominal aorta at this level, without evidence of aneurysmal dilatation. 5. 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 This is essentially unchanged in appearance from 2012, aside from perhaps slightly increased prominence of the splenic artery. The celiac trunk remains patent. 6. Nonobstructing 2 mm stone at the lower pole of right kidney. Right renal cyst noted. These results were called by telephone at the time of interpretation on 12/25/2015 at 3:18 am to Dr. Hinda Kehr, who verbally acknowledged these results. Electronically Signed   By: Garald Balding M.D.   On: 12/25/2015 03:18   Ct Cta Abd/pel W/cm &/or W/o Cm  12/25/2015  CLINICAL DATA:  Acute onset of severe generalized abdominal pain and tingling at the feet. Question of hematuria. Initial encounter. EXAM: CT ANGIOGRAPHY CHEST, ABDOMEN AND PELVIS TECHNIQUE: Multidetector CT imaging through the chest, abdomen and pelvis was performed  using the standard protocol during bolus administration of intravenous contrast. Multiplanar reconstructed images and MIPs were obtained and reviewed to evaluate the vascular anatomy. CONTRAST:  80 mL of Isovue 370 IV contrast COMPARISON:  CT of the abdomen and pelvis from 03/01/2011 FINDINGS: CTA CHEST FINDINGS There is no evidence of aortic dissection. There is no evidence of aneurysmal dilatation. The ascending thoracic aorta is borderline normal in caliber. Minimal calcification is noted along the aortic arch and descending thoracic aorta. There is no evidence of central pulmonary embolus. Mild central left basilar airspace opacity may reflect atelectasis or mild pneumonia. There is no evidence  of pleural effusion or pneumothorax. No masses are identified; no abnormal focal contrast enhancement is seen. The mediastinum is unremarkable appearance, aside from diffuse coronary artery calcification. No mediastinal lymphadenopathy is seen. No pericardial effusion is identified. The great vessels are grossly unremarkable in appearance. No axillary lymphadenopathy is seen. The thyroid gland is unremarkable in appearance. No acute osseous abnormalities are seen. Review of the MIP images confirms the above findings. CTA ABDOMEN AND PELVIS FINDINGS There is no evidence of acute aortic dissection. There is no evidence of aneurysmal dilatation. The small ulcerative plaque at the distal abdominal aorta has increased mildly in size. There is mild ectasia of the abdominal aorta at this level, measuring up to 2.4 cm in diameter. 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 This appearance is essentially unchanged from 2012, aside from perhaps slightly increased prominence of the splenic artery. The celiac trunk remains patent.  The superior mesenteric artery and bilateral renal arteries are grossly unremarkable in appearance. Two left-sided renal arteries are noted. The inferior mesenteric artery is also patent. The liver and spleen are unremarkable in appearance. The patient is status post cholecystectomy, with clips noted at the gallbladder fossa. The pancreas and adrenal glands are unremarkable. There is mild right-sided hydronephrosis, which reflects a 4 mm obstructing stone at the proximal right ureter, 3-4 cm below the right renal pelvis. Right-sided perinephric stranding and fluid are noted. A 4.8 cm cyst is noted at the upper pole of the right kidney. Mild nonspecific left-sided perinephric stranding is seen. A nonobstructing 2 mm stone is noted at the lower pole of the right kidney. No free fluid is identified. The small bowel is unremarkable in appearance. The stomach is within normal limits. No acute vascular abnormalities are seen. The appendix is normal in caliber, without evidence of appendicitis. The colon is unremarkable in appearance. The bladder is mildly distended and grossly unremarkable. The prostate remains normal in size, with minimal calcification. No inguinal lymphadenopathy is seen. No acute osseous abnormalities are identified. Review of the MIP images confirms the above findings. IMPRESSION: 1. No evidence of acute aortic dissection. No evidence of aneurysmal dilatation. 2. Mild right-sided hydronephrosis, reflecting a 4 mm obstructing stone at the proximal right ureter, 3-4 cm below the right renal pelvis. 3. Mild central left basilar airspace opacity may reflect atelectasis or mild pneumonia. 4. 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 Mild ectasia of the abdominal aorta at this level, without evidence of aneurysmal dilatation. 5. 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 This is essentially unchanged in appearance from 2012, aside from perhaps slightly increased prominence of the splenic artery. The celiac trunk remains patent. 6. Nonobstructing 2 mm stone at the lower pole of right kidney. Right renal cyst noted. These results were called by telephone at the time of interpretation on 12/25/2015 at 3:18 am to Dr. Hinda Kehr, who verbally acknowledged these results. Electronically Signed   By: Garald Balding M.D.   On: 12/25/2015 03:18     Assessment: s/p  Patient with a right 4 mm ureteral stone with hydronephrosis.  Plan: Continue with MET Patient encouraged to ambulate and push fluids Continue with pain control Will continue to follow         [facility] 12/26/2015

## 2015-12-26 NOTE — Discharge Instructions (Signed)
Kidney Stones Kidney stones (urolithiasis) are solid masses that form inside your kidneys. The intense pain is caused by the stone moving through the kidney, ureter, bladder, and urethra (urinary tract). When the stone moves, the ureter starts to spasm around the stone. The stone is usually passed in your pee (urine).  HOME CARE  Drink enough fluids to keep your pee clear or pale yellow. This helps to get the stone out.  Take a 24-hour pee (urine) sample as told by your doctor. You may need to take another sample every 6-12 months.  Strain all pee through the provided strainer. Do not pee without peeing through the strainer, not even once. If you pee the stone out, catch it in the strainer. The stone may be as small as a grain of salt. Take this to your doctor. This will help your doctor figure out what you can do to try to prevent more kidney stones.  Only take medicine as told by your doctor.  Make changes to your daily diet as told by your doctor. You may be told to:  Limit how much salt you eat.  Eat 5 or more servings of fruits and vegetables each day.  Limit how much meat, poultry, fish, and eggs you eat.  Keep all follow-up visits as told by your doctor. This is important.  Get follow-up X-rays as told by your doctor. GET HELP IF: You have pain that gets worse even if you have been taking pain medicine. GET HELP RIGHT AWAY IF:   Your pain does not get better with medicine.  You have a fever or shaking chills.  Your pain increases and gets worse over 18 hours.  You have new belly (abdominal) pain.  You feel faint or pass out.  You are unable to pee.   This information is not intended to replace advice given to you by your health care provider. Make sure you discuss any questions you have with your health care provider.   Document Released: 02/26/2008 Document Revised: 05/31/2015 Document Reviewed: 02/10/2013 Elsevier Interactive Patient Education 2016 [place] to drink plenty of water. Hold lisinopril and hydrochlorothiazide for now. Recheck BP and BMP (lab) in follow up appointment

## 2015-12-26 NOTE — Progress Notes (Signed)
Pharmacy Antibiotic Note  Andre Stone is a 70 y.o. male admitted on 12/25/2015 with pneumonia.  Pharmacy has been consulted for levofloxacin dosing.  Patient is currently on day #2 of antibiotic regimen  Plan: Adjust levofloxacin dose to 750 mg IV q 48 hours given renal function (CrCl ~44 mL/min).  Height: 6' (182.9 cm) Weight: 170 lb (77.111 kg) IBW/kg (Calculated) : 77.6  Temp (24hrs), Avg:97.8 F (36.6 C), Min:97.3 F (36.3 C), Max:98.2 F (36.8 C)   Recent Labs Lab 12/25/15 0146 12/26/15 0431  WBC 11.2* 12.5*  CREATININE 1.57* 1.71*    Estimated Creatinine Clearance: 44.5 mL/min (by C-G formula based on Cr of 1.71).     Antimicrobials this admission: levofloxacin 4/3 >>  CTX 4/3 for one dose  Dose adjustments this admission: 4/4 Changed frequency to q 48 hours based on renal function  Microbiology results: 4/3 UCx: No growth final   Thank you for allowing pharmacy to be a part of this patient's care.  Lenis Noon, PharmD 12/26/2015 9:40 AM

## 2015-12-27 ENCOUNTER — Encounter: Payer: Self-pay | Admitting: Emergency Medicine

## 2015-12-27 ENCOUNTER — Emergency Department
Admission: EM | Admit: 2015-12-27 | Discharge: 2015-12-27 | Disposition: A | Discharge status: Home / Self Care | Payer: BC Managed Care – PPO | Attending: Emergency Medicine | Admitting: Emergency Medicine

## 2015-12-27 ENCOUNTER — Telehealth: Payer: Self-pay | Admitting: Urology

## 2015-12-27 DIAGNOSIS — Z7982 Long term (current) use of aspirin: Secondary | ICD-10-CM | POA: Insufficient documentation

## 2015-12-27 DIAGNOSIS — N200 Calculus of kidney: Secondary | ICD-10-CM | POA: Diagnosis present

## 2015-12-27 DIAGNOSIS — I10 Essential (primary) hypertension: Secondary | ICD-10-CM | POA: Diagnosis not present

## 2015-12-27 DIAGNOSIS — Z87442 Personal history of urinary calculi: Secondary | ICD-10-CM | POA: Insufficient documentation

## 2015-12-27 DIAGNOSIS — N23 Unspecified renal colic: Secondary | ICD-10-CM | POA: Diagnosis not present

## 2015-12-27 LAB — CBC WITH DIFFERENTIAL/PLATELET
BASOS ABS: 0.1 10*3/uL (ref 0–0.1)
Basophils Relative: 0 %
EOS PCT: 1 %
Eosinophils Absolute: 0.1 10*3/uL (ref 0–0.7)
HCT: 34.2 % — ABNORMAL LOW (ref 40.0–52.0)
Hemoglobin: 11.9 g/dL — ABNORMAL LOW (ref 13.0–18.0)
LYMPHS ABS: 1.1 10*3/uL (ref 1.0–3.6)
LYMPHS PCT: 9 %
MCH: 30 pg (ref 26.0–34.0)
MCHC: 34.8 g/dL (ref 32.0–36.0)
MCV: 86.1 fL (ref 80.0–100.0)
MONO ABS: 1.1 10*3/uL — AB (ref 0.2–1.0)
MONOS PCT: 9 %
Neutro Abs: 9.9 10*3/uL — ABNORMAL HIGH (ref 1.4–6.5)
Neutrophils Relative %: 81 %
PLATELETS: 178 10*3/uL (ref 150–440)
RBC: 3.97 MIL/uL — ABNORMAL LOW (ref 4.40–5.90)
RDW: 14 % (ref 11.5–14.5)
WBC: 12.2 10*3/uL — ABNORMAL HIGH (ref 3.8–10.6)

## 2015-12-27 LAB — COMPREHENSIVE METABOLIC PANEL
ALT: 24 U/L (ref 17–63)
ANION GAP: 7 (ref 5–15)
AST: 19 U/L (ref 15–41)
Albumin: 3.7 g/dL (ref 3.5–5.0)
Alkaline Phosphatase: 50 U/L (ref 38–126)
BILIRUBIN TOTAL: 0.7 mg/dL (ref 0.3–1.2)
BUN: 26 mg/dL — ABNORMAL HIGH (ref 6–20)
CO2: 28 mmol/L (ref 22–32)
Calcium: 8.8 mg/dL — ABNORMAL LOW (ref 8.9–10.3)
Chloride: 101 mmol/L (ref 101–111)
Creatinine, Ser: 1.65 mg/dL — ABNORMAL HIGH (ref 0.61–1.24)
GFR calc Af Amer: 47 mL/min — ABNORMAL LOW (ref >60)
GFR calc non Af Amer: 41 mL/min — ABNORMAL LOW (ref >60)
GLUCOSE: 115 mg/dL — AB (ref 65–99)
POTASSIUM: 3.7 mmol/L (ref 3.5–5.1)
Sodium: 136 mmol/L (ref 135–145)
TOTAL PROTEIN: 6.7 g/dL (ref 6.5–8.1)

## 2015-12-27 LAB — URINALYSIS COMPLETE WITH MICROSCOPIC (ARMC ONLY)
BILIRUBIN URINE: NEGATIVE
Bacteria, UA: NONE SEEN
GLUCOSE, UA: NEGATIVE mg/dL
LEUKOCYTES UA: NEGATIVE
Nitrite: NEGATIVE
Protein, ur: 30 mg/dL — AB
SPECIFIC GRAVITY, URINE: 1.018 (ref 1.005–1.030)
SQUAMOUS EPITHELIAL / LPF: NONE SEEN
pH: 5 (ref 5.0–8.0)

## 2015-12-27 MED ORDER — HYDROMORPHONE HCL 2 MG PO TABS
2.0000 mg | ORAL_TABLET | Freq: Two times a day (BID) | ORAL | Status: AC | PRN
Start: 2015-12-27 — End: 2016-12-26

## 2015-12-27 MED ORDER — HYDROMORPHONE HCL 1 MG/ML IJ SOLN
1.0000 mg | Freq: Once | INTRAMUSCULAR | Status: AC
  Administered 2015-12-27: 1 mg via INTRAVENOUS
  Filled 2015-12-27: qty 1

## 2015-12-27 MED ORDER — ONDANSETRON HCL 4 MG/2ML IJ SOLN
4.0000 mg | Freq: Once | INTRAMUSCULAR | Status: AC
  Administered 2015-12-27: 4 mg via INTRAVENOUS
  Filled 2015-12-27: qty 2

## 2015-12-27 MED ORDER — SODIUM CHLORIDE 0.9 % IV SOLN
1000.0000 mL | Freq: Once | INTRAVENOUS | Status: AC
  Administered 2015-12-27: 1000 mL via INTRAVENOUS

## 2015-12-27 MED ORDER — PROMETHAZINE HCL 25 MG PO TABS: 25.0000 mg | ORAL_TABLET | Freq: Four times a day (QID) | ORAL | Status: DC | PRN

## 2015-12-27 MED ORDER — HYDROMORPHONE HCL 1 MG/ML IJ SOLN
0.5000 mg | Freq: Once | INTRAMUSCULAR | Status: AC
  Administered 2015-12-27: 0.5 mg via INTRAVENOUS
  Filled 2015-12-27: qty 1

## 2015-12-27 MED ORDER — MORPHINE SULFATE (PF) 4 MG/ML IV SOLN
4.0000 mg | Freq: Once | INTRAVENOUS | Status: AC
  Administered 2015-12-27: 4 mg via INTRAVENOUS
  Filled 2015-12-27: qty 1

## 2015-12-27 NOTE — Discharge Instructions (Signed)
Renal Colic  Renal colic is pain that is caused by passing a kidney stone. The pain can be sharp and severe. It may be felt in the back, abdomen, side (flank), or groin. It can cause nausea. Renal colic can come and go.  HOME CARE INSTRUCTIONS  Watch your condition for any changes. The following actions may help to lessen any discomfort that you are feeling:  · Take medicines only as directed by your health care provider.  · Ask your health care provider if it is okay to take over-the-counter pain medicine.  · Drink enough fluid to keep your urine clear or pale yellow. Drink 6-8 glasses of water each day.  · Limit the amount of salt that you eat to less than 2 grams per day.  · Reduce the amount of protein in your diet. Eat less meat, fish, nuts, and dairy.  · Avoid foods such as spinach, rhubarb, nuts, or bran. These may make kidney stones more likely to form.  SEEK MEDICAL CARE IF:  · You have a fever or chills.  · Your urine smells bad or looks cloudy.  · You have pain or burning when you pass urine.  SEEK IMMEDIATE MEDICAL CARE IF:  · Your flank pain or groin pain suddenly worsens.  · You become confused or disoriented or you lose consciousness.     This information is not intended to replace advice given to you by your health care provider. Make sure you discuss any questions you have with your health care provider.     Document Released: 06/19/2005 Document Revised: 09/30/2014 Document Reviewed: 07/20/2014  Elsevier Interactive Patient Education ©2016 [employer]

## 2015-12-27 NOTE — ED Notes (Signed)
Pt presents to the ER from home with complaints of right flank pain reports seen in the ER Monday diagnosed with Kidney stones, pt reports he decided not to be admitted rports he was feeling better. Pt reports pain increased over night, pt is distress. Pt talks in complete sentences no respiratory distress noted

## 2015-12-27 NOTE — ED Provider Notes (Signed)
[facility] Emergency Department Provider Note     Time seen: ----------------------------------------- 8:46 AM on 12/27/2015 -----------------------------------------    I have reviewed the triage vital signs and the nursing notes.   HISTORY  Chief Complaint Nephrolithiasis    HPI Andre Stone is a 70 y.o. male who presents to ER for right flank pain. Patient was recently admitted to the hospital for a 4 mm kidney stone and was discharged in improved pain. Does complain of nausea, states he went home and could not get any pain relief. He's been taking OxyContin that has not helped his pain significantly. He has not had fevers or chills, they were seen by urologist in the hospital.   Past Medical History  Diagnosis Date  . Hypertension   . History of kidney stones     Patient Active Problem List   Diagnosis Date Noted  . Kidney stone 12/25/2015  . Community acquired pneumonia 12/25/2015  . Pneumonia 12/25/2015    Past Surgical History  Procedure Laterality Date  . Cholecystectomy    . Tonsillectomy      Allergies Penicillins  Social History Social History  Substance Use Topics  . Smoking status: Never Smoker   . Smokeless tobacco: None  . Alcohol Use: Yes     Comment: regular, several times per week, liquor    Review of Systems Constitutional: Negative for fever. Eyes: Negative for visual changes. ENT: Negative for sore throat. Cardiovascular: Negative for chest pain. Respiratory: Negative for shortness of breath. Gastrointestinal: Positive for abdominal pain, nausea Genitourinary: Negative for dysuria. Musculoskeletal: Negative for back pain. Skin: Negative for rash. Neurological: Negative for headaches, focal weakness or numbness.  10-point ROS otherwise negative.  ____________________________________________   PHYSICAL EXAM:  VITAL SIGNS: ED Triage Vitals  Enc Vitals Group     BP 12/27/15 0612 161/84 mmHg     Pulse Rate 12/27/15 0612 91     Resp 12/27/15 0612 24     Temp 12/27/15 0612 98.4 F (36.9 C)     Temp Source 12/27/15 0612 Oral     SpO2 12/27/15 0612 98 %     Weight 12/27/15 0612 190 lb (86.183 kg)     Height 12/27/15 0612 6' (1.829 m)     Head Cir --      Peak Flow --      Pain Score 12/27/15 0613 7     Pain Loc --      Pain Edu? --      Excl. in [place]? --     Constitutional: Alert and oriented. Well appearing and in no distress. Eyes: Conjunctivae are normal. PERRL. Normal extraocular movements. ENT   Head: Normocephalic and atraumatic.   Nose: No congestion/rhinnorhea.   Mouth/Throat: Mucous membranes are moist.   Neck: No stridor. Cardiovascular: Normal rate, regular rhythm. Normal and symmetric distal pulses are present in all extremities. No murmurs, rubs, or gallops. Respiratory: Normal respiratory effort without tachypnea nor retractions. Breath sounds are clear and equal bilaterally. No wheezes/rales/rhonchi. Gastrointestinal: Right flank tenderness, no rebound or guarding. Normal bowel sounds. Musculoskeletal: Nontender with normal range of motion in all extremities. No joint effusions.  No lower extremity tenderness nor edema. Neurologic:  Normal speech and language. No gross focal neurologic deficits are appreciated.  Skin:  Skin is warm, dry and intact. No rash noted. Psychiatric: Mood and affect are normal. Speech and behavior are normal. Patient exhibits appropriate insight and judgment. ____________________________________________  ED COURSE:  Pertinent labs & imaging results that  were available during my care of the patient were reviewed by me and considered in my medical decision making (see chart for details). Patient is in no acute distress at the time my evaluation, he's received IV Dilaudid. I'll recheck his lab work and discussed with urology. ____________________________________________    LABS (pertinent positives/negatives)  Labs  Reviewed  CBC WITH DIFFERENTIAL/PLATELET - Abnormal; Notable for the following:    WBC 12.2 (*)    RBC 3.97 (*)    Hemoglobin 11.9 (*)    HCT 34.2 (*)    Neutro Abs 9.9 (*)    Monocytes Absolute 1.1 (*)    All other components within normal limits  COMPREHENSIVE METABOLIC PANEL - Abnormal; Notable for the following:    Glucose, Bld 115 (*)    BUN 26 (*)    Creatinine, Ser 1.65 (*)    Calcium 8.8 (*)    GFR calc non Af Amer 41 (*)    GFR calc Af Amer 47 (*)    All other components within normal limits  URINALYSIS COMPLETEWITH MICROSCOPIC ([facility] ONLY) - Abnormal; Notable for the following:    Color, Urine YELLOW (*)    APPearance CLEAR (*)    Ketones, ur TRACE (*)    Hgb urine dipstick 2+ (*)    Protein, ur 30 (*)    All other components within normal limits   ____________________________________________  FINAL ASSESSMENT AND PLAN  Renal colic  Plan: Patient with labs and imaging as dictated above. I have discussed this patient with Dr. Hollice Espy who feels he can be discharged home. I will increase his pain medication. He should feel a pass the stone. I will write for Dilaudid and Phenergan that he can use as needed.   Earleen Newport, MD   Earleen Newport, MD 12/27/15 [id number]

## 2015-12-27 NOTE — ED Notes (Signed)
UA collected. RN notified pt requesting pain meds

## 2015-12-27 NOTE — Telephone Encounter (Signed)
He will be here at 9:45 I spoke with his wife   Andre Stone

## 2015-12-27 NOTE — Telephone Encounter (Signed)
4 mm stone returned to the ED with renal colic.  No evidence of infection.  Pain controlled and discharged.    Called to speak with patient re: back up plan for stone if not able to pass it.    Recommend follow up in clinic tomorrow to device plan which may include ESWL the following week.    Hollice Espy, MD

## 2015-12-27 NOTE — ED Notes (Addendum)
Pt was seen here last night for kidney stones and went home and couldn't get pain relief. Last pain meds were Oxycontin at 3 am. Pt also c/o nausea

## 2015-12-28 ENCOUNTER — Ambulatory Visit
Admission: RE | Admit: 2015-12-28 | Discharge: 2015-12-28 | Disposition: A | Discharge status: Home / Self Care | Payer: BC Managed Care – PPO | Source: Ambulatory Visit | Attending: Urology | Admitting: Urology

## 2015-12-28 ENCOUNTER — Ambulatory Visit (INDEPENDENT_AMBULATORY_CARE_PROVIDER_SITE_OTHER): Payer: BC Managed Care – PPO | Admitting: Urology

## 2015-12-28 ENCOUNTER — Telehealth: Payer: Self-pay | Admitting: Radiology

## 2015-12-28 ENCOUNTER — Encounter: Payer: Self-pay | Admitting: Urology

## 2015-12-28 VITALS — BP 116/71 | HR 86 | Ht 72.0 in | Wt 195.0 lb

## 2015-12-28 DIAGNOSIS — Z09 Encounter for follow-up examination after completed treatment for conditions other than malignant neoplasm: Secondary | ICD-10-CM | POA: Insufficient documentation

## 2015-12-28 DIAGNOSIS — I10 Essential (primary) hypertension: Secondary | ICD-10-CM | POA: Insufficient documentation

## 2015-12-28 DIAGNOSIS — G4730 Sleep apnea, unspecified: Secondary | ICD-10-CM

## 2015-12-28 DIAGNOSIS — K219 Gastro-esophageal reflux disease without esophagitis: Secondary | ICD-10-CM | POA: Insufficient documentation

## 2015-12-28 DIAGNOSIS — N1330 Unspecified hydronephrosis: Secondary | ICD-10-CM

## 2015-12-28 DIAGNOSIS — Z87442 Personal history of urinary calculi: Secondary | ICD-10-CM | POA: Insufficient documentation

## 2015-12-28 DIAGNOSIS — N179 Acute kidney failure, unspecified: Secondary | ICD-10-CM | POA: Diagnosis not present

## 2015-12-28 DIAGNOSIS — N200 Calculus of kidney: Secondary | ICD-10-CM

## 2015-12-28 DIAGNOSIS — G4700 Insomnia, unspecified: Secondary | ICD-10-CM | POA: Insufficient documentation

## 2015-12-28 HISTORY — DX: Sleep apnea, unspecified: G47.30

## 2015-12-28 HISTORY — DX: Gastro-esophageal reflux disease without esophagitis: K21.9

## 2015-12-28 HISTORY — DX: Essential (primary) hypertension: I10

## 2015-12-28 LAB — MICROSCOPIC EXAMINATION
Bacteria, UA: NONE SEEN
RBC, UA: >30 /hpf — AB (ref 0–?)

## 2015-12-28 LAB — URINALYSIS, COMPLETE
BILIRUBIN UA: NEGATIVE
GLUCOSE, UA: NEGATIVE
KETONES UA: NEGATIVE
LEUKOCYTES UA: NEGATIVE
Nitrite, UA: NEGATIVE
SPEC GRAV UA: 1.015 (ref 1.005–1.030)
Urobilinogen, Ur: 0.2 mg/dL (ref 0.2–1.0)
pH, UA: 6 (ref 5.0–7.5)

## 2015-12-28 NOTE — Progress Notes (Signed)
12/28/2015 4:21 PM   Andre Stone 21-Mar-1946 [id number]  Referring provider: Dion Body, MD [place] [place], [place] [address]  Chief Complaint  Patient presents with  . Nephrolithiasis    New Patient    HPI: 70 year old male with a 4 mm right proximal ureteral stone who is recently admitted to the medical service on 12/25/2015. Overnight, his pain was controlled and he was discharged on the following day. Unfortunately, he developed severe worsening of his pain and was seen in the ER yesterday. He returns to the office today to discuss possible definitive management of his stone if he continues to have severe pain and ultimately fails medical expulsive therapy.  Upon admission, he was noted to have an elevated creatinine to 1.57 which did worsen the following day after receiving IV contrast. His creatinine did improve the following day in the ED to 1.65 but not back to his baseline of 1.2.  No fevers or chills. Borderline leukocytosis. UA/urine culture is negative other than for microscopic blood/protein.  No nausea or vomiting.  Since yesterday, his pain has been able to be controlled with by mouth Dilaudid. Today, he has been quite comfortable. He has been straining his urine and not seen the stone pass.   PMH: Past Medical History  Diagnosis Date  . Hypertension   . History of kidney stones   . Essential (primary) hypertension 12/28/2015  . Community acquired pneumonia 12/25/2015  . Gastro-esophageal reflux disease without esophagitis 12/28/2015  . 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 Apnea, sleep 12/28/2015    Surgical History: Past Surgical History  Procedure Laterality Date  . Cholecystectomy    . Tonsillectomy      Home Medications:    Medication List       This list is accurate as of: 12/28/15  4:21 PM.  Always use your most recent med list.               aspirin EC 81 MG tablet  Take 81 mg by mouth daily.       cholecalciferol 1000 units tablet  Commonly known as:  VITAMIN D  Take 2,000 Units by mouth daily.     Fish Oil 1000 MG Caps  Take 1,000 mg by mouth daily.     hydrochlorothiazide 25 MG tablet  Commonly known as:  HYDRODIURIL  Take 25 mg by mouth daily.     HYDROmorphone 2 MG tablet  Commonly known as:  DILAUDID  Take 1 tablet (2 mg total) by mouth every 12 (twelve) hours as needed for severe pain.     levofloxacin 750 MG tablet  Commonly known as:  LEVAQUIN  Take 1 tablet (750 mg total) by mouth daily.     lisinopril 40 MG tablet  Commonly known as:  PRINIVIL,ZESTRIL  Take 40 mg by mouth daily.     MELATIN 3-1 MG Tabs  Generic drug:  Melatonin-Pyridoxine  Take 3 mg by mouth at bedtime as needed.     multivitamin with minerals tablet  Take 1 tablet by mouth daily.     oxyCODONE 5 MG immediate release tablet  Commonly known as:  Oxy IR/ROXICODONE  Take 1 tablet (5 mg total) by mouth every 6 (six) hours as needed for moderate pain, severe pain or breakthrough pain.     tamsulosin 0.4 MG Caps capsule  Commonly known as:  FLOMAX  Take 1 capsule (0.4 mg total) by mouth daily after supper.     zolpidem 5  MG tablet  Commonly known as:  AMBIEN  Take 5 mg by mouth at bedtime as needed for sleep (pt takes 0.5 tablet prn).        Allergies:  Pennicillin   Family History: Family History  Problem Relation Age of Onset  . Bladder Cancer Neg Hx   . Kidney cancer Neg Hx   . Prostate cancer Neg Hx     Social History:  reports that he has never smoked. He does not have any smokeless tobacco history on file. He reports that he drinks alcohol. He reports that he does not use illicit drugs.  ROS: UROLOGY Frequent Urination?: No Hard to postpone urination?: No Burning/pain with urination?: No Get up at night to urinate?: Yes Leakage of urine?: No Urine stream starts and stops?: No Trouble starting stream?: No Do you have to strain to urinate?: No Blood in urine?:  Yes Urinary tract infection?: No Sexually transmitted disease?: No Injury to kidneys or bladder?: No Painful intercourse?: No Weak stream?: No Erection problems?: No Penile pain?: No  Gastrointestinal Nausea?: No Vomiting?: No Indigestion/heartburn?: No Diarrhea?: No Constipation?: No  Constitutional Fever: No Night sweats?: No Weight loss?: No Fatigue?: No  Skin Skin rash/lesions?: No Itching?: No  Eyes Blurred vision?: No Double vision?: No  Ears/Nose/Throat Sore throat?: No Sinus problems?: No  Hematologic/Lymphatic Swollen glands?: No Easy bruising?: No  Cardiovascular Leg swelling?: No Chest pain?: No  Respiratory Cough?: No Shortness of breath?: No  Endocrine Excessive thirst?: No  Musculoskeletal Back pain?: No Joint pain?: No  Neurological Headaches?: No Dizziness?: No  Psychologic Depression?: No Anxiety?: No  Physical Exam: BP 116/71 mmHg  Pulse 86  Ht 6' (1.829 m)  Wt 195 lb (88.451 kg)  BMI 26.44 kg/m2  Constitutional:  Alert and oriented, No acute distress.  Presents with his wife. HEENT: [place] AT, moist mucus membranes.  Trachea midline, no masses. Cardiovascular: No clubbing, cyanosis, or edema. Respiratory: Normal respiratory effort, no increased work of breathing. GI: Abdomen is soft, nontender, nondistended, no abdominal masses GU: No CVA tenderness.  Skin: No rashes, bruises or suspicious lesions. Neurologic: Grossly intact, no focal deficits, moving all 4 extremities. Psychiatric: Normal mood and affect.  Laboratory Data: Lab Results  Component Value Date   WBC 12.2* 12/27/2015   HGB 11.9* 12/27/2015   HCT 34.2* 12/27/2015   MCV 86.1 12/27/2015   PLT 178 12/27/2015    Lab Results  Component Value Date   CREATININE 1.65* 12/27/2015    Urinalysis Results for orders placed or performed in visit on 12/28/15  Microscopic Examination  Result Value Ref Range   WBC, UA 6-10 (A) 0 -  5 /hpf   RBC, UA >30 (A) 0 -   2 /hpf   Epithelial Cells (non renal) 0-10 0 - 10 /hpf   Bacteria, UA None seen None seen/Few  Urinalysis, Complete  Result Value Ref Range   Specific Gravity, UA 1.015 1.005 - 1.030   pH, UA 6.0 5.0 - 7.5   Color, UA Yellow Yellow   Appearance Ur Cloudy (A) Clear   Leukocytes, UA Negative Negative   Protein, UA 3+ (A) Negative/Trace   Glucose, UA Negative Negative   Ketones, UA Negative Negative   RBC, UA 3+ (A) Negative   Bilirubin, UA Negative Negative   Urobilinogen, Ur 0.2 0.2 - 1.0 mg/dL   Nitrite, UA Negative Negative   Microscopic Examination See below:     Pertinent Imaging: CLINICAL DATA: Acute onset of severe generalized abdominal pain  and tingling at the feet. Question of hematuria. Initial encounter.  EXAM: CT ANGIOGRAPHY CHEST, ABDOMEN AND PELVIS  TECHNIQUE: Multidetector CT imaging through the chest, abdomen and pelvis was performed using the standard protocol during bolus administration of intravenous contrast. Multiplanar reconstructed images and MIPs were obtained and reviewed to evaluate the vascular anatomy.  CONTRAST: 80 mL of Isovue 370 IV contrast  COMPARISON: CT of the abdomen and pelvis from 03/01/2011  FINDINGS: CTA CHEST FINDINGS  There is no evidence of aortic dissection. There is no evidence of aneurysmal dilatation. The ascending thoracic aorta is borderline normal in caliber. Minimal calcification is noted along the aortic arch and descending thoracic aorta.  There is no evidence of central pulmonary embolus.  Mild central left basilar airspace opacity may reflect atelectasis or mild pneumonia. There is no evidence of pleural effusion or pneumothorax. No masses are identified; no abnormal focal contrast enhancement is seen.  The mediastinum is unremarkable appearance, aside from diffuse coronary artery calcification. No mediastinal lymphadenopathy is seen. No pericardial effusion is identified. The great vessels  are grossly unremarkable in appearance. No axillary lymphadenopathy is seen. The thyroid gland is unremarkable in appearance.  No acute osseous abnormalities are seen.  Review of the MIP images confirms the above findings.  CTA ABDOMEN AND PELVIS FINDINGS  There is no evidence of acute aortic dissection. There is no evidence of aneurysmal dilatation. The small ulcerative plaque at the distal abdominal aorta has increased mildly in size. There is mild ectasia of the abdominal aorta at this level, measuring up to 2.4 cm in diameter.  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 This appearance is essentially unchanged from 2012, aside from perhaps slightly increased prominence of the splenic artery. The celiac trunk remains patent.  The superior mesenteric artery and bilateral renal arteries are grossly unremarkable in appearance. Two left-sided renal arteries are noted. The inferior mesenteric artery is also patent.  The liver and spleen are unremarkable in appearance. The patient is status post cholecystectomy, with clips noted at the gallbladder fossa. The pancreas and adrenal glands are unremarkable.  There is mild right-sided hydronephrosis, which reflects a 4 mm obstructing stone at the proximal right ureter, 3-4 cm below the right renal pelvis. Right-sided perinephric stranding and fluid are noted. A 4.8 cm cyst is noted at the upper pole of the right kidney. Mild nonspecific left-sided perinephric stranding is seen. A nonobstructing 2 mm stone is noted at the lower pole of the right kidney.  No free fluid is identified. The small bowel is unremarkable in appearance. The stomach is within normal limits. No acute vascular abnormalities are seen.  The  appendix is normal in caliber, without evidence of appendicitis. The colon is unremarkable in appearance.  The bladder is mildly distended and grossly unremarkable. The prostate remains normal in size, with minimal calcification. No inguinal lymphadenopathy is seen.  No acute osseous abnormalities are identified.  Review of the MIP images confirms the above findings.  IMPRESSION: 1. No evidence of acute aortic dissection. No evidence of aneurysmal dilatation. 2. Mild right-sided hydronephrosis, reflecting a 4 mm obstructing stone at the proximal right ureter, 3-4 cm below the right renal pelvis. 3. Mild central left basilar airspace opacity may reflect atelectasis or mild pneumonia. 4. Small ulcerative plaque at the distal abdominal  aorta has increased mildly in size since 2012. Mild ectasia of the abdominal aorta at this level, without evidence of aneurysmal dilatation. 5. 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 This is essentially unchanged in appearance from 2012, aside from perhaps slightly increased prominence of the splenic artery. The celiac trunk remains patent. 6. Nonobstructing 2 mm stone at the lower pole of right kidney. Right renal cyst noted.   Electronically Signed  By: Garald Balding M.D.  On: 12/25/2015 03:18   Assessment & Plan:    1. Kidney stone 4 mm proximal ureteral stone with refractory pain. Is currently under control with PO dilaudid.  Explained to the patient today that he is still a good candidate for medical expulsive therapy and is currently on Flomax. I do have some concerns however given that he return to the ER shortly after discharge for pain control issues. As such, I would like to develop a plan to treat his stone should be continued to have  severe and poorly controlled pain.  We discussed various treatment options including ESWL vs. ureteroscopy, laser lithotripsy, and stent. We discussed the risks and benefits of both including bleeding, infection, damage to surrounding structures, efficacy with need for possible further intervention, and need for temporary ureteral stent.  For KUB today the stone location. He will continue to strain his urine. If the stone is still present, he would like to proceed with ESWL.  Paperwork was completed today in anticipation of possible procedure.  - Urinalysis, Complete  2. Right hydronephrosis Secondary to #1  3. AKI Likely 2/2 dehydration and contrast nephropathy  Hollice Espy, MD  [facility] [address] [place],  [address] [phone]

## 2015-12-28 NOTE — Telephone Encounter (Signed)
-----   Message from Hollice Espy, MD sent at 12/28/2015  3:58 PM EDT ----- The radiologist did not appreciate any ureteral stone on his read. There was one area in the bony pelvis that I thought may represent the stone but is questionable. I would like the patient call us on Monday and let us know if he had any pain whatsoever over the weekend. If so, I'd like him to repeat his KUB Monday. If not, we can safely assume that he passed the stone. Lets hold off on booking him for ESWL until then.  Hollice Espy, MD

## 2015-12-28 NOTE — Telephone Encounter (Signed)
Notified pt's wife that ureteral stone was not appreciated on KUB. Advised that pt should call Monday to let us know if he had any pain over the weekend & if so Dr Erlene Quan would like to get a repeat KUB Monday. Wife voices understanding.

## 2015-12-29 ENCOUNTER — Encounter
Admission: EM | Disposition: A | Discharge status: Home / Self Care | Payer: Self-pay | Source: Home / Self Care | Attending: Emergency Medicine

## 2015-12-29 ENCOUNTER — Emergency Department
Admission: EM | Admit: 2015-12-29 | Discharge: 2015-12-30 | Disposition: A | Discharge status: Home / Self Care | Payer: BC Managed Care – PPO | Attending: Emergency Medicine | Admitting: Emergency Medicine

## 2015-12-29 ENCOUNTER — Encounter: Payer: Self-pay | Admitting: Emergency Medicine

## 2015-12-29 ENCOUNTER — Emergency Department: Payer: BC Managed Care – PPO | Admitting: Anesthesiology

## 2015-12-29 DIAGNOSIS — K219 Gastro-esophageal reflux disease without esophagitis: Secondary | ICD-10-CM | POA: Insufficient documentation

## 2015-12-29 DIAGNOSIS — N1330 Unspecified hydronephrosis: Secondary | ICD-10-CM | POA: Insufficient documentation

## 2015-12-29 DIAGNOSIS — R1031 Right lower quadrant pain: Secondary | ICD-10-CM | POA: Diagnosis not present

## 2015-12-29 DIAGNOSIS — Z880 Allergy status to penicillin: Secondary | ICD-10-CM | POA: Diagnosis not present

## 2015-12-29 DIAGNOSIS — N132 Hydronephrosis with renal and ureteral calculous obstruction: Secondary | ICD-10-CM | POA: Diagnosis not present

## 2015-12-29 DIAGNOSIS — Z87442 Personal history of urinary calculi: Secondary | ICD-10-CM | POA: Diagnosis not present

## 2015-12-29 DIAGNOSIS — G4730 Sleep apnea, unspecified: Secondary | ICD-10-CM | POA: Insufficient documentation

## 2015-12-29 DIAGNOSIS — N281 Cyst of kidney, acquired: Secondary | ICD-10-CM | POA: Diagnosis not present

## 2015-12-29 DIAGNOSIS — N200 Calculus of kidney: Secondary | ICD-10-CM | POA: Diagnosis present

## 2015-12-29 DIAGNOSIS — Z9049 Acquired absence of other specified parts of digestive tract: Secondary | ICD-10-CM | POA: Diagnosis not present

## 2015-12-29 DIAGNOSIS — N202 Calculus of kidney with calculus of ureter: Secondary | ICD-10-CM | POA: Insufficient documentation

## 2015-12-29 DIAGNOSIS — N289 Disorder of kidney and ureter, unspecified: Secondary | ICD-10-CM | POA: Diagnosis not present

## 2015-12-29 DIAGNOSIS — I10 Essential (primary) hypertension: Secondary | ICD-10-CM | POA: Insufficient documentation

## 2015-12-29 HISTORY — PX: CYSTOSCOPY/URETEROSCOPY/HOLMIUM LASER/STENT PLACEMENT: SHX6546

## 2015-12-29 LAB — CBC
HCT: 38.7 % — ABNORMAL LOW (ref 40.0–52.0)
Hemoglobin: 13.2 g/dL (ref 13.0–18.0)
MCH: 29.9 pg (ref 26.0–34.0)
MCHC: 34.1 g/dL (ref 32.0–36.0)
MCV: 87.7 fL (ref 80.0–100.0)
PLATELETS: 225 10*3/uL (ref 150–440)
RBC: 4.41 MIL/uL (ref 4.40–5.90)
RDW: 14.1 % (ref 11.5–14.5)
WBC: 9.7 10*3/uL (ref 3.8–10.6)

## 2015-12-29 LAB — BASIC METABOLIC PANEL
ANION GAP: 10 (ref 5–15)
BUN: 26 mg/dL — ABNORMAL HIGH (ref 6–20)
CHLORIDE: 99 mmol/L — AB (ref 101–111)
CO2: 27 mmol/L (ref 22–32)
CREATININE: 1.37 mg/dL — AB (ref 0.61–1.24)
Calcium: 9.2 mg/dL (ref 8.9–10.3)
GFR calc Af Amer: 59 mL/min — ABNORMAL LOW (ref >60)
GFR, EST NON AFRICAN AMERICAN: 51 mL/min — AB (ref >60)
Glucose, Bld: 126 mg/dL — ABNORMAL HIGH (ref 65–99)
POTASSIUM: 4.2 mmol/L (ref 3.5–5.1)
Sodium: 136 mmol/L (ref 135–145)

## 2015-12-29 LAB — URINALYSIS COMPLETE WITH MICROSCOPIC (ARMC ONLY)
BILIRUBIN URINE: NEGATIVE
GLUCOSE, UA: NEGATIVE mg/dL
LEUKOCYTES UA: NEGATIVE
NITRITE: NEGATIVE
PH: 5 (ref 5.0–8.0)
Protein, ur: 100 mg/dL — AB
SPECIFIC GRAVITY, URINE: 1.015 (ref 1.005–1.030)

## 2015-12-29 SURGERY — CYSTOSCOPY/URETEROSCOPY/HOLMIUM LASER/STENT PLACEMENT
Anesthesia: General | Site: Ureter | Laterality: Right | Wound class: Clean Contaminated

## 2015-12-29 MED ORDER — HYDROMORPHONE HCL 1 MG/ML IJ SOLN
INTRAMUSCULAR | Status: AC
  Administered 2015-12-29: 1 mg via INTRAVENOUS
  Filled 2015-12-29: qty 1

## 2015-12-29 MED ORDER — HYDROMORPHONE HCL 1 MG/ML IJ SOLN
1.0000 mg | INTRAMUSCULAR | Status: AC
  Administered 2015-12-29: 1 mg via INTRAVENOUS

## 2015-12-29 MED ORDER — CIPROFLOXACIN IN D5W 400 MG/200ML IV SOLN
400.0000 mg | Freq: Once | INTRAVENOUS | Status: DC
  Filled 2015-12-29: qty 200

## 2015-12-29 MED ORDER — PHENYLEPHRINE HCL 10 MG/ML IJ SOLN
INTRAMUSCULAR | Status: DC | PRN
  Administered 2015-12-29 – 2015-12-30 (×6): 200 ug via INTRAVENOUS

## 2015-12-29 MED ORDER — PROPOFOL 10 MG/ML IV BOLUS
INTRAVENOUS | Status: DC | PRN
  Administered 2015-12-29: 160 mg via INTRAVENOUS

## 2015-12-29 MED ORDER — FENTANYL CITRATE (PF) 100 MCG/2ML IJ SOLN
INTRAMUSCULAR | Status: DC | PRN
  Administered 2015-12-29: 50 ug via INTRAVENOUS

## 2015-12-29 MED ORDER — GLYCOPYRROLATE 0.2 MG/ML IJ SOLN
INTRAMUSCULAR | Status: DC | PRN
  Administered 2015-12-29: 0.2 mg via INTRAVENOUS

## 2015-12-29 MED ORDER — MIDAZOLAM HCL 2 MG/2ML IJ SOLN
INTRAMUSCULAR | Status: DC | PRN
  Administered 2015-12-29: 2 mg via INTRAVENOUS

## 2015-12-29 MED ORDER — ONDANSETRON HCL 4 MG/2ML IJ SOLN
4.0000 mg | Freq: Once | INTRAMUSCULAR | Status: AC
  Administered 2015-12-29: 4 mg via INTRAVENOUS

## 2015-12-29 MED ORDER — LIDOCAINE HCL (CARDIAC) 20 MG/ML IV SOLN
INTRAVENOUS | Status: DC | PRN
  Administered 2015-12-29: 80 mg via INTRAVENOUS

## 2015-12-29 MED ORDER — ONDANSETRON HCL 4 MG/2ML IJ SOLN
INTRAMUSCULAR | Status: AC
  Administered 2015-12-29: 4 mg via INTRAVENOUS
  Filled 2015-12-29: qty 2

## 2015-12-29 MED ORDER — LACTATED RINGERS IV SOLN
INTRAVENOUS | Status: DC | PRN
  Administered 2015-12-29: 23:00:00 via INTRAVENOUS

## 2015-12-29 SURGICAL SUPPLY — 28 items
ADAPTER SCOPE UROLOK II (MISCELLANEOUS) IMPLANT
BAG DRAIN CYSTO-URO LG1000N (MISCELLANEOUS) ×2 IMPLANT
BASKET ZERO TIP 1.9FR (BASKET) ×2 IMPLANT
CATH URETL 5X70 OPEN END (CATHETERS) ×2 IMPLANT
CNTNR SPEC 2.5X3XGRAD LEK (MISCELLANEOUS)
CONRAY 43 FOR UROLOGY 50M (MISCELLANEOUS) ×2 IMPLANT
CONT SPEC 4OZ STER OR WHT (MISCELLANEOUS)
CONTAINER SPEC 2.5X3XGRAD LEK (MISCELLANEOUS) IMPLANT
DRAPE UTILITY 15X26 TOWEL STRL (DRAPES) ×2 IMPLANT
GLOVE BIO SURGEON STRL SZ 6.5 (GLOVE) ×6 IMPLANT
GOWN STRL REUS W/ TWL LRG LVL3 (GOWN DISPOSABLE) ×2 IMPLANT
GOWN STRL REUS W/TWL LRG LVL3 (GOWN DISPOSABLE) ×2
INTRODUCER DILATOR DOUBLE (INTRODUCER) IMPLANT
KIT RM TURNOVER CYSTO AR (KITS) ×2 IMPLANT
PACK CYSTO AR (MISCELLANEOUS) ×2 IMPLANT
PREP PVP WINGED SPONGE (MISCELLANEOUS) ×2 IMPLANT
PUMP SINGLE ACTION SAP (PUMP) IMPLANT
SENSORWIRE 0.038 NOT ANGLED (WIRE) ×4
SET CYSTO W/LG BORE CLAMP LF (SET/KITS/TRAYS/PACK) ×2 IMPLANT
SHEATH URETERAL 12FRX35CM (MISCELLANEOUS) ×2 IMPLANT
SOL .9 NS 3000ML IRR  AL (IV SOLUTION) ×2
SOL .9 NS 3000ML IRR UROMATIC (IV SOLUTION) ×2 IMPLANT
STENT URET 6FRX24 CONTOUR (STENTS) IMPLANT
STENT URET 6FRX26 CONTOUR (STENTS) ×2 IMPLANT
SURGILUBE 2OZ TUBE FLIPTOP (MISCELLANEOUS) ×2 IMPLANT
TUBE FEED INF 8F (TUBING) ×2 IMPLANT
WATER STERILE IRR 1000ML POUR (IV SOLUTION) ×2 IMPLANT
WIRE SENSOR 0.038 NOT ANGLED (WIRE) ×2 IMPLANT

## 2015-12-29 NOTE — ED Notes (Signed)
Cipro has been spiked and hung but is not infusing due to OR request to save medication for administration in OR. Pt taken to OR by orderly. Consent with pt and pts belongings in wife's possession. Pt alert and in NAD.

## 2015-12-29 NOTE — ED Notes (Signed)
Pt reports he remains pain free at this time.

## 2015-12-29 NOTE — ED Notes (Signed)
Patient unable to drink much, but has been able to urinate. Urinated more in the morning less throughout the evening.

## 2015-12-29 NOTE — ED Provider Notes (Signed)
[facility] Emergency Department Provider Note  ____________________________________________  Time seen: Approximately 7:04 PM  I have reviewed the triage vital signs and the nursing notes.   HISTORY  Chief Complaint Flank Pain    HPI Andre Stone is a 70 y.o. male presents for "kidney stone pain".  Patient reports that for the last few days he's been having trouble with a 4 mm kidney stone. Started and continues on as fairly severe pain in the right flank. The pain sometimes comes and goes, in particular tonight his cane came back severely he continues to have nausea, vomiting, and severe pain located over the right flank that radiates towards the groin.  He has been taking Dilaudid at home with little to no relief, also on anti-medic. He followed up with urology yesterday and things seemed a little better then but the pain seems to have severely recurred today. States he feels dehydrated and he has been able to eat anything all day due to pain.  He has not seen blood in his urine the last couple of days but symptoms did start initially with him passing blood in the urine. He denies any numbness tingling or weakness in the legs. No pain in the midline or in the back, he does not have any pain on the left side.   Past Medical History  Diagnosis Date  . Hypertension   . History of kidney stones   . Essential (primary) hypertension 12/28/2015  . Community acquired pneumonia 12/25/2015  . Gastro-esophageal reflux disease without esophagitis 12/28/2015  . 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 Apnea, sleep 12/28/2015    Patient Active Problem List   Diagnosis Date Noted  . Essential (primary) hypertension 12/28/2015  . Gastro-esophageal reflux disease without esophagitis 12/28/2015  . Cannot sleep 12/28/2015  . 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 Kidney stone 12/25/2015  . Community acquired pneumonia 12/25/2015  . Pneumonia 12/25/2015  . Borderline diabetes  mellitus 01/27/2014    Past Surgical History  Procedure Laterality Date  . Cholecystectomy    . Tonsillectomy      Current Outpatient Rx  Name  Route  Sig  Dispense  Refill  . aspirin EC 81 MG tablet   Oral   Take 81 mg by mouth daily.         . cholecalciferol (VITAMIN D) 1000 units tablet   Oral   Take 2,000 Units by mouth daily.         . hydrochlorothiazide (HYDRODIURIL) 25 MG tablet   Oral   Take 25 mg by mouth daily.         Marland Kitchen HYDROmorphone (DILAUDID) 2 MG tablet   Oral   Take 1 tablet (2 mg total) by mouth every 12 (twelve) hours as needed for severe pain.   20 tablet   0   . levofloxacin (LEVAQUIN) 750 MG tablet   Oral   Take 1 tablet (750 mg total) by mouth daily.   3 tablet   0   . lisinopril (PRINIVIL,ZESTRIL) 40 MG tablet   Oral   Take 40 mg by mouth daily.         . Melatonin-Pyridoxine (MELATIN) 3-1 MG TABS   Oral   Take 3 mg by mouth at bedtime as needed.         . Multiple Vitamins-Minerals (MULTIVITAMIN WITH MINERALS) tablet   Oral   Take 1 tablet by mouth daily.         . Omega-3 Fatty Acids (FISH OIL) 1000  MG CAPS   Oral   Take 1,000 mg by mouth daily.         Marland Kitchen oxyCODONE (OXY IR/ROXICODONE) 5 MG immediate release tablet   Oral   Take 1 tablet (5 mg total) by mouth every 6 (six) hours as needed for moderate pain, severe pain or breakthrough pain. Patient not taking: Reported on 12/28/2015   30 tablet   0   . tamsulosin (FLOMAX) 0.4 MG CAPS capsule   Oral   Take 1 capsule (0.4 mg total) by mouth daily after supper.   30 capsule   0   . zolpidem (AMBIEN) 5 MG tablet   Oral   Take 5 mg by mouth at bedtime as needed for sleep (pt takes 0.5 tablet prn).           Allergies Penicillins  Family History  Problem Relation Age of Onset  . Bladder Cancer Neg Hx   . Kidney cancer Neg Hx   . Prostate cancer Neg Hx     Social History Social History  Substance Use Topics  . Smoking status: Never Smoker   . Smokeless  tobacco: None  . Alcohol Use: Yes     Comment: regular, several times per week, liquor    Review of Systems Constitutional: No fever/chills Eyes: No visual changes. ENT: No sore throat. Cardiovascular: Denies chest pain. Respiratory: Denies shortness of breath. Gastrointestinal: No diarrhea.  No constipation. Genitourinary: Negative for dysuria. Musculoskeletal: Negative for back pain. Skin: Negative for rash. Neurological: Negative for headaches, focal weakness or numbness.  10-point ROS otherwise negative.  ____________________________________________   PHYSICAL EXAM:  VITAL SIGNS: ED Triage Vitals  Enc Vitals Group     BP 12/29/15 1744 198/99 mmHg     Pulse Rate 12/29/15 1744 94     Resp 12/29/15 1744 22     Temp 12/29/15 1744 98.6 F (37 C)     Temp src --      SpO2 12/29/15 1744 98 %     Weight 12/29/15 1744 195 lb (88.451 kg)     Height 12/29/15 1744 6' (1.829 m)     Head Cir --      Peak Flow --      Pain Score 12/29/15 1745 9     Pain Loc --      Pain Edu? --      Excl. in [place]? --    Constitutional: Alert and oriented. Writhing about, appearing nauseated holding emesis basin. Appears in severe pain. Eyes: Conjunctivae are normal. PERRL. EOMI. Head: Atraumatic. Nose: No congestion/rhinnorhea. Mouth/Throat: Mucous membranes are slightly dry.  Oropharynx non-erythematous. Neck: No stridor.   Cardiovascular: Normal rate, regular rhythm. Grossly normal heart sounds.  Good peripheral circulation. Respiratory: Normal respiratory effort.  No retractions. Lungs CTAB. Gastrointestinal: Soft but moderate tenderness along the right abdomen and flank. No groin mass.. No distention. No significant pain over the left side of the abdomen. Musculoskeletal: No lower extremity tenderness nor edema.  No joint effusions. Neurologic:  Normal speech and language. No gross focal neurologic deficits are appreciated. Skin:  Skin is warm, dry and intact. No rash noted. Psychiatric:  Mood and affect are anxious, likely because he is in pain. Speech and behavior are normal.  ____________________________________________   LABS (all labs ordered are listed, but only abnormal results are displayed)  Labs Reviewed  BASIC METABOLIC PANEL - Abnormal; Notable for the following:    Chloride 99 (*)    Glucose, Bld 126 (*)  BUN 26 (*)    Creatinine, Ser 1.37 (*)    GFR calc non Af Amer 51 (*)    GFR calc Af Amer 59 (*)    All other components within normal limits  CBC - Abnormal; Notable for the following:    HCT 38.7 (*)    All other components within normal limits  URINALYSIS COMPLETEWITH MICROSCOPIC ([facility] ONLY) - Abnormal; Notable for the following:    Color, Urine YELLOW (*)    APPearance HAZY (*)    Ketones, ur 1+ (*)    Hgb urine dipstick 2+ (*)    Protein, ur 100 (*)    Bacteria, UA RARE (*)    Squamous Epithelial / LPF 0-5 (*)    All other components within normal limits   ____________________________________________  EKG   ____________________________________________  RADIOLOGY   ____________________________________________   PROCEDURES  Procedure(s) performed: None  Critical Care performed: No  ____________________________________________   INITIAL IMPRESSION / ASSESSMENT AND PLAN / ED COURSE  Pertinent labs & imaging results that were available during my care of the patient were reviewed by me and considered in my medical decision making (see chart for details).  Patient repairs and for recurrent severe right flank pain. 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 He is taking appropriate outpatient prescriptions, and still his pain causing him to be nauseated vomit and developed severe pain.  He has a history of chronic dissection, but I find nothing to  suggest new dissection today and his previous CT does not demonstrate any obvious new concerns or develop right-sided pain secondary to vascular problem.  ----------------------------------------- 8:15 PM on 12/29/2015 -----------------------------------------  Patient's pain, blood pressure improving significantly after that a lot of IV. Discussed case with Dr. Tammi Klippel of urology, he is planning to admit the patient to the OR. As the patient is failing outpatient management due to pain control, he will be coming to see the patient in a proximally 1 hour in the ER. Patient aware, nothing by mouth since 1 PM. Continue to control pain and await consult from urology for final decision, presently pended for admission to the OR. Urinalysis does not appear consistent with infection.  Plan above discussed with Dr. Kerman Passey will follow-up on urology consult recommendations. ____________________________________________   FINAL CLINICAL IMPRESSION(S) / ED DIAGNOSES  Final diagnoses:  Right nephrolithiasis      Delman Kitten, MD 12/29/15 2016

## 2015-12-29 NOTE — Anesthesia Procedure Notes (Signed)
Procedure Name: LMA Insertion Date/Time: 12/29/2015 11:34 PM Performed by: Doreen Salvage Pre-anesthesia Checklist: Patient identified, Patient being monitored, Timeout performed, Emergency Drugs available and Suction available Patient Re-evaluated:Patient Re-evaluated prior to inductionOxygen Delivery Method: Circle system utilized Preoxygenation: Pre-oxygenation with 100% oxygen Intubation Type: IV induction Ventilation: Mask ventilation without difficulty LMA: LMA inserted LMA Size: 4.0 Tube type: Oral Number of attempts: 1 Placement Confirmation: positive ETCO2 and breath sounds checked- equal and bilateral Tube secured with: Tape Dental Injury: Teeth and Oropharynx as per pre-operative assessment

## 2015-12-29 NOTE — ED Notes (Signed)
Attempted IV access x2 unsuccessful 

## 2015-12-29 NOTE — Telephone Encounter (Signed)
Pt called stating he is having close to 10/10 pain & states he has taken oxycodone without relief. Per Dr Erlene Quan he should take the dilaudid that was prescribed in the ER & we will plan to proceed with ESWL on 01/04/16.  If pt is unable to tolerate the pain he should go to the ER. Pt voices understanding.

## 2015-12-29 NOTE — H&P (Signed)
Andre Stone is an 70 y.o. male.    Chief Complaint: Right Ureteral / Renal Stones with Refractory Pain and Renal Insuficiency  HPI:   1 - Right Ureteral / Renal Stones with Refractory Pain and Renal Insuficiency - 49m Rt mid ureteral + small Rt renal stone by ER CT 12/25/15 on eval abdominal pain. Given trial of medical therapy and brief admission for pain control but now with ER visit x 2 more for refracotry pain and nausea, unable to maintain hydration. Cr up to 1.7. Most recent UA without infectious parameters. Most recent UCX negative.   2 - Right Non-Complex Renal Cyst - 4.8cm Rt upper pole cyst incidental by CT 12/2015. No coarse calcifications / nodular enhancement or mass effect.   PMH sig for HTN. NO ischemic CV disease. NO strong blood thinners.   Today " Andre Stone is seen for above. 3rd ER visit in 1 week for right ureteral stone.    Past Medical History  Diagnosis Date  . Hypertension   . History of kidney stones   . Essential (primary) hypertension 12/28/2015  . Community acquired pneumonia 12/25/2015  . Gastro-esophageal reflux disease without esophagitis 12/28/2015  . 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 Apnea, sleep 12/28/2015    Past Surgical History  Procedure Laterality Date  . Cholecystectomy    . Tonsillectomy      Family History  Problem Relation Age of Onset  . Bladder Cancer Neg Hx   . Kidney cancer Neg Hx   . Prostate cancer Neg Hx    Social History:  reports that he has never smoked. He does not have any smokeless tobacco history on file. He reports that he drinks alcohol. He reports that he does not use illicit drugs.  Allergies:  PCN   (Not in a hospital admission)  Results for orders placed or performed during the hospital encounter of 12/29/15 (from the past 48 hour(s))  Basic metabolic panel     Status: Abnormal   Collection Time: 12/29/15  6:39 PM  Result Value Ref Range   Sodium 136 135 - 145 mmol/L   Potassium 4.2 3.5 - 5.1 mmol/L   Chloride 99 (L) 101 - 111 mmol/L   CO2 27 22 - 32 mmol/L   Glucose, Bld 126 (H) 65 - 99 mg/dL   BUN 26 (H) 6 - 20 mg/dL   Creatinine, Ser 1.37 (H) 0.61 - 1.24 mg/dL   Calcium 9.2 8.9 - 10.3 mg/dL   GFR calc non Af Amer 51 (L) >60 mL/min   GFR calc Af Amer 59 (L) >60 mL/min    Comment: (NOTE) The eGFR has been calculated using the CKD EPI equation. This calculation has not been validated in all clinical situations. eGFR's persistently <60 mL/min signify possible Chronic Kidney Disease.    Anion gap 10 5 - 15  CBC     Status: Abnormal   Collection Time: 12/29/15  6:39 PM  Result Value Ref Range   WBC 9.7 3.8 - 10.6 K/uL   RBC 4.41 4.40 - 5.90 MIL/uL   Hemoglobin 13.2 13.0 - 18.0 g/dL   HCT 38.7 (L) 40.0 - 52.0 %   MCV 87.7 80.0 - 100.0 fL   MCH 29.9 26.0 - 34.0 pg   MCHC 34.1 32.0 - 36.0 g/dL   RDW 14.1 11.5 - 14.5 %   Platelets 225 150 - 440 K/uL   Dg Abd 1 View  12/28/2015  CLINICAL DATA:  History kidney stones with urinary frequency EXAM: ABDOMEN -  1 VIEW COMPARISON:  12/25/2015 FINDINGS: Scattered large and small bowel gas is noted. The known tiny right renal stone is not well appreciated on this exam. The known right proximal ureteral stone is not well appreciated on this study. The bony structures are within normal limits. IMPRESSION: Known right kidney and ureteral stones are not well appreciated on this examination. Electronically Signed   By: Inez Catalina M.D.   On: 12/28/2015 15:08    Review of Systems  Constitutional: Negative.  Negative for fever, chills and weight loss.  HENT: Negative.   Eyes: Negative.   Respiratory: Negative.   Cardiovascular: Negative.   Gastrointestinal: Negative.   Genitourinary: Positive for hematuria and flank pain.  Musculoskeletal: Negative.   Skin: Negative.   Neurological: Negative.   Endo/Heme/Allergies: Negative.   Psychiatric/Behavioral: Negative.     Blood pressure 198/99, pulse 94, temperature 98.6 F (37 C), resp. rate 22,  height 6' (1.829 m), weight 88.451 kg (195 lb), SpO2 98 %. Physical Exam  Constitutional: He appears well-developed.  Wife at bedside  HENT:  Head: Normocephalic.  Eyes: Pupils are equal, round, and reactive to light.  Neck: Normal range of motion.  Cardiovascular: Normal rate.   Respiratory: Effort normal.  GI: Soft.  Genitourinary:  Rt CVAT  Musculoskeletal: Normal range of motion.  Neurological: He is alert.  Skin: Skin is warm.  Psychiatric: He has a normal mood and affect. His behavior is normal. Judgment and thought content normal.     Assessment/Plan  1 - Right Ureteral / Renal Stones with Refractory Pain and Renal Insuficiency - Recommend cysto, right retrograde, right ureteroscopy with laser lithotripsy with goal of right side stone free. His symptoms are refractory. Last meal >8 hours ago.   Risks, benefits, alternatives discussed as well of approx 10% chance of stent only if anatomy non-favorable. Plan for DC home post-op.   2 - Right Non-Complex Renal Cyst - no complex features or mass effect. No indication for dedicated surveillance.   Andre Frock, MD 12/29/2015, 7:21 PM

## 2015-12-29 NOTE — Anesthesia Preprocedure Evaluation (Signed)
Anesthesia Evaluation  Patient identified by MRN, date of birth, ID band Patient awake    Reviewed: Allergy & Precautions, H&P , NPO status , Patient's Chart, lab work & pertinent test results  History of Anesthesia Complications Negative for: history of anesthetic complications  Airway Mallampati: III  TM Distance: >3 FB Neck ROM: limited    Dental  (+) Poor Dentition, Chipped   Pulmonary neg shortness of breath, sleep apnea , pneumonia, resolved,    Pulmonary exam normal breath sounds clear to auscultation       Cardiovascular Exercise Tolerance: Good hypertension, (-) angina+ DOE  (-) Past MI Normal cardiovascular exam Rhythm:regular Rate:Normal     Neuro/Psych negative neurological ROS  negative psych ROS   GI/Hepatic Neg liver ROS, GERD  Controlled,  Endo/Other  negative endocrine ROS  Renal/GU Renal disease  negative genitourinary   Musculoskeletal   Abdominal   Peds  Hematology negative hematology ROS (+)   Anesthesia Other Findings Past Medical History:   Hypertension                                                 History of kidney stones                                     Essential (primary) hypertension                12/28/2015     Community acquired pneumonia                    12/25/2015     Gastro-esophageal reflux disease without esoph* 12/28/2015     Borderline diabetes mellitus                    01/27/2014     Apnea, sleep                                    12/28/2015    Past Surgical History:   CHOLECYSTECTOMY                                               TONSILLECTOMY                                                BMI    Body Mass Index   26.44 kg/m 2      Reproductive/Obstetrics negative OB ROS                             Anesthesia Physical Anesthesia Plan  ASA: III  Anesthesia Plan: General LMA   Post-op Pain Management:    Induction:   Airway  Management Planned:   Additional Equipment:   Intra-op Plan:   Post-operative Plan:   Informed Consent: I have reviewed the patients History and Physical, chart, labs and discussed the procedure including the risks, benefits and alternatives  for the proposed anesthesia with the patient or authorized representative who has indicated his/her understanding and acceptance.   Dental Advisory Given  Plan Discussed with: Anesthesiologist, CRNA and Surgeon  Anesthesia Plan Comments:         Anesthesia Quick Evaluation

## 2015-12-29 NOTE — ED Notes (Signed)
Patient has been in and out of the hospital over the past week with kidney stone.  Pain has become untolerable per patient.  He has taken oxycodone x2 and dilaudid PO x1 along with zofran and phenergan with no relief. Vomiting and nausea today also.  Patient in obvious discomfort in triage.  Wife spoke with Dr. Erlene Quan who prescribed the phenergan but no relief still.  Lithotripsy scheduled for this coming Thursday.

## 2015-12-30 MED ORDER — FENTANYL CITRATE (PF) 100 MCG/2ML IJ SOLN: 25.0000 ug | INTRAMUSCULAR | Status: DC | PRN

## 2015-12-30 MED ORDER — IOTHALAMATE MEGLUMINE 43 % IV SOLN
INTRAVENOUS | Status: DC | PRN
  Administered 2015-12-30: 20 mL via URETHRAL

## 2015-12-30 MED ORDER — OXYCODONE HCL 5 MG PO TABS: 5.0000 mg | ORAL_TABLET | Freq: Once | ORAL | Status: DC | PRN

## 2015-12-30 MED ORDER — OXYCODONE HCL 5 MG/5ML PO SOLN: 5.0000 mg | Freq: Once | ORAL | Status: DC | PRN

## 2015-12-30 MED ORDER — ONDANSETRON HCL 4 MG/2ML IJ SOLN
INTRAMUSCULAR | Status: DC | PRN
  Administered 2015-12-30: 4 mg via INTRAVENOUS

## 2015-12-30 NOTE — Anesthesia Postprocedure Evaluation (Signed)
Anesthesia Post Note  Patient: Andre Stone  Procedure(s) Performed: Procedure(s) (LRB): CYSTOSCOPY/URETEROSCOPY/ with basketing of stone, right ureteral /STENT PLACEMENT (Right)  Patient location during evaluation: PACU Anesthesia Type: General Level of consciousness: awake and alert Pain management: pain level controlled Vital Signs Assessment: post-procedure vital signs reviewed and stable Respiratory status: spontaneous breathing, nonlabored ventilation, respiratory function stable and patient connected to nasal cannula oxygen Cardiovascular status: blood pressure returned to baseline and stable Postop Assessment: no signs of nausea or vomiting Anesthetic complications: no    Last Vitals:  Filed Vitals:   12/30/15 0115 12/30/15 0120  BP:  163/97  Pulse: 91 80  Temp:  36.4 C  Resp:      Last Pain:  Filed Vitals:   12/30/15 0136  PainSc: 9                  Joseph K Piscitello

## 2015-12-30 NOTE — Discharge Instructions (Signed)
1 - You may have urinary urgency (bladder spasms) and bloody urine on / off with stent in place. This is normal.  2 - Call MD or go to ER for fever >102, severe pain / nausea / vomiting not relieved by medications, or acute change in medical status  3 - Removed tethered stent on Monday morning at home by pulling on string, then blue plastic tubing, and discarding. Dr. Tresa Moore is in [place] office Monday if any issues arise.

## 2015-12-30 NOTE — Op Note (Signed)
NAME:  JAVELL, HAYN NO.:  [id number]  MEDICAL RECORD NO.:  [id number]  LOCATION:  [facility]                         FACILITY:  [facility]  PHYSICIAN:  [name], MD     DATE OF BIRTH:  March 31, 1946  DATE OF PROCEDURE: 12/30/2015                               OPERATIVE REPORT  DIAGNOSIS:  Right ureteral stone with refractory colic.  PROCEDURE: 1. Cystoscopy with right retrograde pyelogram and interpretation. 2. Right ureteroscopy with basketing of stone. 3. Insertion of right ureteral stent, 6 x 26 Contour with tether.  ESTIMATED BLOOD LOSS:  Nil.  COMPLICATION:  None.  SPECIMENS:  Right renal stone, fragments for compositional analysis.  FINDINGS: 1. Mild impacted distal right ureteral stone with mild     hydroureteronephrosis above this. 2. Multifocal small volume right lower pole renal stones. 3. Complete resolution of all stone fragments larger than 1/3rd mm     following basket extraction and ureteroscopy. 4. Successful placement of right ureteral stent, proximal in renal     pelvis and distal in urinary bladder.  INDICATION:  Mr. Leamy is a pleasant 70 year old with history of prior nephrolithiasis managed medically years ago.  He has had an approximately week long prodrome of severe colicky right flank pain that comes and goes.  He has been on trial of medical therapy, but the symptoms have been refractory.  He tonight had his 3rd ER visit within a week for refractory pain.  Options were discussed for management including continued medical therapy versus more definitive management with ureteroscopy.  He wished to proceed with it latter.  Informed consent was obtained and placed in medical record.  DESCRIPTION OF PROCEDURE:  The patient being Graiden Claussen, was verified.  Procedure being right ureteroscopic stone manipulation was confirmed.  Procedure was carried out.  Time-out was performed. Intravenous antibiotics were administered.  General  LMA anesthesia introduced.  The patient was placed into a low lithotomy position and sterile field was created by prepping and draping the patient's penis, perineum, and proximal thighs using iodine x3.  Next, cystourethroscopy was performed using a 23-French rigid cystoscope 12-degree offset lens. Inspection of the anterior and posterior urethra was unremarkable. Inspection of the urinary bladder revealed no diverticula, calcifications, or papular lesions.  Ureteral orifices appeared to be [place] bilaterally.  The right ureteral orifice was cannulated with an open-ended catheter and right retrograde pyelogram was tapered.  Right retrograde pyelogram demonstrated a single right ureter with single-system right kidney.  There was mild hydroureteronephrosis to a filling defect in distal ureter consistent with likely antegrade progression of known stone.  A 0.038 ZIPwire was advanced at the level of upper pole and set aside as a safety wire.  An 8-French feeding tube placed in urinary bladder for pressure release.  Next, semi-rigid ureteroscopy was performed in the distal right ureter alongside a separate Sensor working wire.  The stone in question was encountered in the distal ureter.  It did appear amenable to basketing as such an Escape basket was used to grasp the stone on its long axis.  It was removed and set aside for compositional analysis.  Semi-rigid ureteroscopy was performed of the distal 4/5 of the right ureter alongside a  separate Sensor working wire.  No mucosal abnormalities were seen other than an area of erythema and some edema in the more proximal ureter likely representing site of prior stone impaction.  As the goal today was for ipsilateral stone free.  The semi-rigid ureteroscope was exchanged for the 12-French 35 cm ureteral access sheath using continuous fluoroscopic guidance.  Local proximal ureter and flexible digital ureteroscopy was performed the proximal  ureter and systematic inspection of the right kidney including all calices x3.  Multifocal small volume stone was encountered in the lower pole.  As expected, these did appear amenable to simple basketing and the Escape basket was used to grasp the small stone fragments, which were then sequentially removed and set aside for compositional analysis.  Following these maneuvers, hemostasis appeared excellent.  There was no evidence of perforation.  The access sheath was removed under continuous vision.  No mucosal abnormalities were found.  Given the patient's significant colic and use of access sheath, it was felt that interval stenting would be warranted.  As such, a new 6 x 26 Contour-type stent was placed with remaining safety wire using fluoroscopic guidance.  Good proximal distal deployment were noted.  Tether was left in place and fashioned the dorsum of the penis.  Procedure terminated.  There were no immediate periprocedural complications.  The patient was taken to the postanesthesia care unit in stable condition with plan for discharge home.          ______________________________ [name], MD     TM/MEDQ  D:  12/30/2015  T:  12/30/2015  Job:  [id number]

## 2015-12-30 NOTE — Brief Op Note (Signed)
12/29/2015 - 12/30/2015  12:14 AM  PATIENT:  Andre Stone  69 y.o. male  PRE-OPERATIVE DIAGNOSIS:  kidney stone, renal failure, refractory pain  POST-OPERATIVE DIAGNOSIS:  same as preop  PROCEDURE:  Procedure(s): CYSTOSCOPY/URETEROSCOPY/ with basketing of stone, right ureteral /STENT PLACEMENT (Right)  SURGEON:  Surgeon(s) and Role:    * [name], MD - Primary  PHYSICIAN ASSISTANT:   ASSISTANTS: none   ANESTHESIA:   general  EBL:     BLOOD ADMINISTERED:none  DRAINS: none   LOCAL MEDICATIONS USED:  NONE  SPECIMEN:  Source of Specimen:  Rt renal stone fragments  DISPOSITION OF SPECIMEN:  for compositional analysis  COUNTS:  YES  TOURNIQUET:  * No tourniquets in log *  DICTATION: .Other Dictation: Dictation Number [id number]  PLAN OF CARE: Discharge to home after PACU  PATIENT DISPOSITION:  PACU - hemodynamically stable.   Delay start of Pharmacological VTE agent (>24hrs) due to surgical blood loss or risk of bleeding: yes

## 2015-12-30 NOTE — Transfer of Care (Signed)
Immediate Anesthesia Transfer of Care Note  Patient: Andre Stone  Procedure(s) Performed: Procedure(s): CYSTOSCOPY/URETEROSCOPY/ with basketing of stone, right ureteral /STENT PLACEMENT (Right)  Patient Location: PACU  Anesthesia Type:General  Level of Consciousness: sedated  Airway & Oxygen Therapy: Patient Spontanous Breathing and Patient connected to face mask oxygen  Post-op Assessment: Report given to RN and Post -op Vital signs reviewed and stable  Post vital signs: Reviewed and stable  Last Vitals:  Filed Vitals:   12/29/15 2255 12/30/15 0025  BP:  148/99  Pulse: 79 87  Temp:  36.4 C  Resp: 21 13    Complications: No apparent anesthesia complications

## 2015-12-30 NOTE — OR Nursing (Signed)
Discharged home from SDS phase 2 to home care via w/c @0130 .

## 2016-01-01 ENCOUNTER — Encounter: Payer: Self-pay | Admitting: Urology

## 2016-01-02 ENCOUNTER — Encounter: Payer: Self-pay | Admitting: Emergency Medicine

## 2016-01-02 ENCOUNTER — Emergency Department: Payer: BC Managed Care – PPO

## 2016-01-02 ENCOUNTER — Observation Stay
Admission: EM | Admit: 2016-01-02 | Discharge: 2016-01-03 | Disposition: A | Discharge status: Home / Self Care | Payer: BC Managed Care – PPO | Attending: Internal Medicine | Admitting: Internal Medicine

## 2016-01-02 DIAGNOSIS — Z79891 Long term (current) use of opiate analgesic: Secondary | ICD-10-CM | POA: Insufficient documentation

## 2016-01-02 DIAGNOSIS — K219 Gastro-esophageal reflux disease without esophagitis: Secondary | ICD-10-CM | POA: Insufficient documentation

## 2016-01-02 DIAGNOSIS — Z9049 Acquired absence of other specified parts of digestive tract: Secondary | ICD-10-CM | POA: Insufficient documentation

## 2016-01-02 DIAGNOSIS — Z9889 Other specified postprocedural states: Secondary | ICD-10-CM | POA: Insufficient documentation

## 2016-01-02 DIAGNOSIS — Z87442 Personal history of urinary calculi: Secondary | ICD-10-CM | POA: Diagnosis not present

## 2016-01-02 DIAGNOSIS — R319 Hematuria, unspecified: Secondary | ICD-10-CM | POA: Insufficient documentation

## 2016-01-02 DIAGNOSIS — N179 Acute kidney failure, unspecified: Secondary | ICD-10-CM | POA: Diagnosis not present

## 2016-01-02 DIAGNOSIS — I10 Essential (primary) hypertension: Secondary | ICD-10-CM | POA: Diagnosis not present

## 2016-01-02 DIAGNOSIS — R109 Unspecified abdominal pain: Secondary | ICD-10-CM

## 2016-01-02 DIAGNOSIS — R7303 Prediabetes: Secondary | ICD-10-CM | POA: Diagnosis not present

## 2016-01-02 DIAGNOSIS — Z823 Family history of stroke: Secondary | ICD-10-CM | POA: Insufficient documentation

## 2016-01-02 DIAGNOSIS — I700 Atherosclerosis of aorta: Secondary | ICD-10-CM | POA: Insufficient documentation

## 2016-01-02 DIAGNOSIS — Z880 Allergy status to penicillin: Secondary | ICD-10-CM | POA: Diagnosis not present

## 2016-01-02 DIAGNOSIS — E876 Hypokalemia: Secondary | ICD-10-CM | POA: Diagnosis not present

## 2016-01-02 DIAGNOSIS — Z8249 Family history of ischemic heart disease and other diseases of the circulatory system: Secondary | ICD-10-CM | POA: Diagnosis not present

## 2016-01-02 DIAGNOSIS — N1330 Unspecified hydronephrosis: Secondary | ICD-10-CM | POA: Insufficient documentation

## 2016-01-02 DIAGNOSIS — N281 Cyst of kidney, acquired: Secondary | ICD-10-CM | POA: Insufficient documentation

## 2016-01-02 DIAGNOSIS — N134 Hydroureter: Secondary | ICD-10-CM | POA: Diagnosis not present

## 2016-01-02 DIAGNOSIS — G4730 Sleep apnea, unspecified: Secondary | ICD-10-CM | POA: Diagnosis not present

## 2016-01-02 DIAGNOSIS — Z79899 Other long term (current) drug therapy: Secondary | ICD-10-CM | POA: Insufficient documentation

## 2016-01-02 DIAGNOSIS — R52 Pain, unspecified: Secondary | ICD-10-CM | POA: Diagnosis present

## 2016-01-02 DIAGNOSIS — Z7982 Long term (current) use of aspirin: Secondary | ICD-10-CM | POA: Insufficient documentation

## 2016-01-02 LAB — LACTIC ACID, PLASMA: LACTIC ACID, VENOUS: 1 mmol/L (ref 0.5–2.0)

## 2016-01-02 LAB — COMPREHENSIVE METABOLIC PANEL
ALBUMIN: 4.3 g/dL (ref 3.5–5.0)
ALT: 28 U/L (ref 17–63)
AST: 29 U/L (ref 15–41)
Alkaline Phosphatase: 51 U/L (ref 38–126)
Anion gap: 12 (ref 5–15)
BILIRUBIN TOTAL: 0.5 mg/dL (ref 0.3–1.2)
BUN: 25 mg/dL — AB (ref 6–20)
CO2: 27 mmol/L (ref 22–32)
CREATININE: 1.3 mg/dL — AB (ref 0.61–1.24)
Calcium: 9.1 mg/dL (ref 8.9–10.3)
Chloride: 97 mmol/L — ABNORMAL LOW (ref 101–111)
GFR calc Af Amer: >60 mL/min (ref >60)
GFR, EST NON AFRICAN AMERICAN: 54 mL/min — AB (ref >60)
GLUCOSE: 153 mg/dL — AB (ref 65–99)
POTASSIUM: 3.1 mmol/L — AB (ref 3.5–5.1)
Sodium: 136 mmol/L (ref 135–145)
TOTAL PROTEIN: 7.7 g/dL (ref 6.5–8.1)

## 2016-01-02 LAB — CBC WITH DIFFERENTIAL/PLATELET
BASOS PCT: 1 %
Basophils Absolute: 0.1 10*3/uL (ref 0–0.1)
Eosinophils Absolute: 0.2 10*3/uL (ref 0–0.7)
Eosinophils Relative: 2 %
HEMATOCRIT: 37.9 % — AB (ref 40.0–52.0)
HEMOGLOBIN: 13 g/dL (ref 13.0–18.0)
LYMPHS PCT: 9 %
Lymphs Abs: 0.9 10*3/uL — ABNORMAL LOW (ref 1.0–3.6)
MCH: 29.4 pg (ref 26.0–34.0)
MCHC: 34.4 g/dL (ref 32.0–36.0)
MCV: 85.6 fL (ref 80.0–100.0)
MONO ABS: 0.4 10*3/uL (ref 0.2–1.0)
Monocytes Relative: 4 %
NEUTROS ABS: 8.4 10*3/uL — AB (ref 1.4–6.5)
NEUTROS PCT: 84 %
Platelets: 261 10*3/uL (ref 150–440)
RBC: 4.42 MIL/uL (ref 4.40–5.90)
RDW: 13.6 % (ref 11.5–14.5)
WBC: 10 10*3/uL (ref 3.8–10.6)

## 2016-01-02 LAB — MAGNESIUM: Magnesium: 1.9 mg/dL (ref 1.7–2.4)

## 2016-01-02 LAB — URINALYSIS COMPLETE WITH MICROSCOPIC (ARMC ONLY)
Bilirubin Urine: NEGATIVE
Glucose, UA: 50 mg/dL — AB
Ketones, ur: NEGATIVE mg/dL
LEUKOCYTES UA: NEGATIVE
Nitrite: NEGATIVE
SPECIFIC GRAVITY, URINE: 1.03 (ref 1.005–1.030)
pH: 7 (ref 5.0–8.0)

## 2016-01-02 LAB — TSH: TSH: 2.697 u[IU]/mL (ref 0.350–4.500)

## 2016-01-02 LAB — LIPASE, BLOOD: Lipase: 45 U/L (ref 11–51)

## 2016-01-02 MED ORDER — SODIUM CHLORIDE 0.9 % IV BOLUS (SEPSIS)
1000.0000 mL | Freq: Once | INTRAVENOUS | Status: AC
Start: 2016-01-02 — End: 2016-01-02
  Administered 2016-01-02: 1000 mL via INTRAVENOUS

## 2016-01-02 MED ORDER — ONDANSETRON HCL 4 MG PO TABS: 4.0000 mg | ORAL_TABLET | Freq: Four times a day (QID) | ORAL | Status: DC | PRN

## 2016-01-02 MED ORDER — HYDROMORPHONE HCL 1 MG/ML IJ SOLN
1.0000 mg | Freq: Once | INTRAMUSCULAR | Status: AC
  Administered 2016-01-02: 1 mg via INTRAVENOUS

## 2016-01-02 MED ORDER — HYDROMORPHONE HCL 1 MG/ML IJ SOLN
INTRAMUSCULAR | Status: AC
  Administered 2016-01-02: 1 mg via INTRAVENOUS
  Filled 2016-01-02: qty 1

## 2016-01-02 MED ORDER — MELATONIN 3 MG PO TABS: 3.0000 mg | ORAL_TABLET | Freq: Every evening | ORAL | Status: DC | PRN

## 2016-01-02 MED ORDER — ONDANSETRON HCL 4 MG/2ML IJ SOLN
4.0000 mg | Freq: Once | INTRAMUSCULAR | Status: AC
  Administered 2016-01-02: 4 mg via INTRAVENOUS

## 2016-01-02 MED ORDER — ONDANSETRON HCL 4 MG/2ML IJ SOLN
INTRAMUSCULAR | Status: AC
Start: 2016-01-02 — End: 2016-01-02
  Administered 2016-01-02: 4 mg via INTRAVENOUS
  Filled 2016-01-02: qty 2

## 2016-01-02 MED ORDER — HYDROMORPHONE HCL 1 MG/ML IJ SOLN
1.0000 mg | Freq: Once | INTRAMUSCULAR | Status: AC
  Administered 2016-01-02: 1 mg via INTRAVENOUS
  Filled 2016-01-02: qty 1

## 2016-01-02 MED ORDER — ONDANSETRON HCL 4 MG/2ML IJ SOLN
INTRAMUSCULAR | Status: AC
  Administered 2016-01-02: 4 mg via INTRAVENOUS
  Filled 2016-01-02: qty 2

## 2016-01-02 MED ORDER — ADULT MULTIVITAMIN W/MINERALS CH
1.0000 | ORAL_TABLET | Freq: Every day | ORAL | Status: DC
  Administered 2016-01-02 – 2016-01-03 (×2): 1 via ORAL
  Filled 2016-01-02 (×2): qty 1

## 2016-01-02 MED ORDER — HYDROCODONE-ACETAMINOPHEN 5-325 MG PO TABS: 1.0000 | ORAL_TABLET | ORAL | Status: DC | PRN

## 2016-01-02 MED ORDER — SODIUM CHLORIDE 0.9% FLUSH
3.0000 mL | Freq: Two times a day (BID) | INTRAVENOUS | Status: DC
  Administered 2016-01-02: 3 mL via INTRAVENOUS

## 2016-01-02 MED ORDER — LABETALOL HCL 5 MG/ML IV SOLN
10.0000 mg | Freq: Once | INTRAVENOUS | Status: AC
  Administered 2016-01-02: 10 mg via INTRAVENOUS
  Filled 2016-01-02: qty 4

## 2016-01-02 MED ORDER — POTASSIUM CHLORIDE CRYS ER 20 MEQ PO TBCR
40.0000 meq | EXTENDED_RELEASE_TABLET | Freq: Once | ORAL | Status: AC
  Administered 2016-01-02: 40 meq via ORAL
  Filled 2016-01-02: qty 2

## 2016-01-02 MED ORDER — IOPAMIDOL (ISOVUE-370) INJECTION 76%
100.0000 mL | Freq: Once | INTRAVENOUS | Status: AC | PRN
  Administered 2016-01-02: 100 mL via INTRAVENOUS

## 2016-01-02 MED ORDER — ZOLPIDEM TARTRATE 5 MG PO TABS: 2.5000 mg | ORAL_TABLET | Freq: Every evening | ORAL | Status: DC | PRN

## 2016-01-02 MED ORDER — HYDROCHLOROTHIAZIDE 25 MG PO TABS
25.0000 mg | ORAL_TABLET | Freq: Every day | ORAL | Status: DC
  Administered 2016-01-03: 25 mg via ORAL
  Filled 2016-01-02: qty 1

## 2016-01-02 MED ORDER — PROMETHAZINE HCL 25 MG PO TABS: 25.0000 mg | ORAL_TABLET | Freq: Four times a day (QID) | ORAL | Status: DC | PRN

## 2016-01-02 MED ORDER — DOCUSATE SODIUM 100 MG PO CAPS: 100.0000 mg | ORAL_CAPSULE | Freq: Two times a day (BID) | ORAL | Status: DC | PRN

## 2016-01-02 MED ORDER — ACETAMINOPHEN 325 MG PO TABS: 650.0000 mg | ORAL_TABLET | Freq: Four times a day (QID) | ORAL | Status: DC | PRN

## 2016-01-02 MED ORDER — PANTOPRAZOLE SODIUM 40 MG PO TBEC
40.0000 mg | DELAYED_RELEASE_TABLET | Freq: Every day | ORAL | Status: DC
  Administered 2016-01-02 – 2016-01-03 (×2): 40 mg via ORAL
  Filled 2016-01-02 (×2): qty 1

## 2016-01-02 MED ORDER — TAMSULOSIN HCL 0.4 MG PO CAPS
0.4000 mg | ORAL_CAPSULE | Freq: Every day | ORAL | Status: DC
  Administered 2016-01-02: 0.4 mg via ORAL
  Filled 2016-01-02: qty 1

## 2016-01-02 MED ORDER — VITAMIN D 1000 UNITS PO TABS
2000.0000 [IU] | ORAL_TABLET | Freq: Every day | ORAL | Status: DC
  Administered 2016-01-02 – 2016-01-03 (×2): 2000 [IU] via ORAL
  Filled 2016-01-02 (×2): qty 2

## 2016-01-02 MED ORDER — LISINOPRIL 20 MG PO TABS
40.0000 mg | ORAL_TABLET | Freq: Every day | ORAL | Status: DC
  Administered 2016-01-03: 40 mg via ORAL
  Filled 2016-01-02: qty 2

## 2016-01-02 MED ORDER — ASPIRIN EC 81 MG PO TBEC
81.0000 mg | DELAYED_RELEASE_TABLET | Freq: Every day | ORAL | Status: DC
  Administered 2016-01-03: 81 mg via ORAL
  Filled 2016-01-02: qty 1

## 2016-01-02 MED ORDER — ENOXAPARIN SODIUM 40 MG/0.4ML ~~LOC~~ SOLN
40.0000 mg | SUBCUTANEOUS | Status: DC
  Administered 2016-01-02: 40 mg via SUBCUTANEOUS
  Filled 2016-01-02: qty 0.4

## 2016-01-02 MED ORDER — HYDROMORPHONE HCL 1 MG/ML IJ SOLN
1.0000 mg | INTRAMUSCULAR | Status: DC | PRN
  Administered 2016-01-02 (×2): 1 mg via INTRAVENOUS
  Filled 2016-01-02 (×3): qty 1

## 2016-01-02 MED ORDER — POTASSIUM CHLORIDE IN NACL 40-0.9 MEQ/L-% IV SOLN
INTRAVENOUS | Status: DC
  Administered 2016-01-02: 100 mL/h via INTRAVENOUS
  Filled 2016-01-02 (×3): qty 1000

## 2016-01-02 MED ORDER — ACETAMINOPHEN 650 MG RE SUPP: 650.0000 mg | Freq: Four times a day (QID) | RECTAL | Status: DC | PRN

## 2016-01-02 MED ORDER — OMEGA-3-ACID ETHYL ESTERS 1 G PO CAPS
1.0000 g | ORAL_CAPSULE | Freq: Two times a day (BID) | ORAL | Status: DC
Start: 2016-01-02 — End: 2016-01-03
  Administered 2016-01-02 – 2016-01-03 (×3): 1 g via ORAL
  Filled 2016-01-02 (×3): qty 1

## 2016-01-02 MED ORDER — ONDANSETRON HCL 4 MG/2ML IJ SOLN: 4.0000 mg | Freq: Four times a day (QID) | INTRAMUSCULAR | Status: DC | PRN

## 2016-01-02 NOTE — ED Notes (Signed)
Pt in with co abd pain has been here several times for the same in the last week and was dx with kidney stones.

## 2016-01-02 NOTE — ED Notes (Signed)
Dr. Brown at bedside

## 2016-01-02 NOTE — ED Notes (Signed)
Patient transported to CT via stretcher.

## 2016-01-02 NOTE — ED Provider Notes (Signed)
[facility] Emergency Department Provider Note  ____________________________________________  Time seen: 4:40AM  I have reviewed the triage vital signs and the nursing notes.   HISTORY  Chief Complaint Abdominal Pain     HPI Andre Stone is a 70 y.o. male with history of chronic celiac trunk dissection and recent kidney stone status post ureteroscopy and stent placement with subsequent stent removal yesterday presents with 10 out of 10 midline abdominal pain. Patient states the pain started on his back but is now located in the midline of his abdomen. Patient admits to nausea with active retching on my arrival to the room. Patient states that his pain is not controlled with home prescription of Dilaudid   Past Medical History  Diagnosis Date  . Hypertension   . History of kidney stones   . Essential (primary) hypertension 12/28/2015  . Community acquired pneumonia 12/25/2015  . Gastro-esophageal reflux disease without esophagitis 12/28/2015  . 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 Apnea, sleep 12/28/2015    Patient Active Problem List   Diagnosis Date Noted  . Intractable pain 01/02/2016  . Essential (primary) hypertension 12/28/2015  . Gastro-esophageal reflux disease without esophagitis 12/28/2015  . Cannot sleep 12/28/2015  . 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 Kidney stone 12/25/2015  . Community acquired pneumonia 12/25/2015  . Pneumonia 12/25/2015  . Borderline diabetes mellitus 01/27/2014    Past Surgical History  Procedure Laterality Date  . Cholecystectomy    . Tonsillectomy    . Cystoscopy/ureteroscopy/holmium laser/stent placement Right 12/29/2015    Procedure: CYSTOSCOPY/URETEROSCOPY/ with basketing of stone, right ureteral /STENT PLACEMENT;  Surgeon: [name], MD;  Location: [facility] ORS;  Service: Urology;  Laterality: Right;    Current Outpatient Rx  Name  Route  Sig  Dispense  Refill  . aspirin EC 81 MG tablet   Oral   Take 81 mg  by mouth daily.         . cholecalciferol (VITAMIN D) 1000 units tablet   Oral   Take 2,000 Units by mouth daily.         Marland Kitchen docusate sodium (COLACE) 100 MG capsule   Oral   Take 100 mg by mouth 2 (two) times daily as needed for mild constipation.         Marland Kitchen esomeprazole (NEXIUM) 20 MG capsule   Oral   Take 20 mg by mouth daily at 12 noon.         . hydrochlorothiazide (HYDRODIURIL) 25 MG tablet   Oral   Take 25 mg by mouth daily.         Marland Kitchen HYDROmorphone (DILAUDID) 2 MG tablet   Oral   Take 1 tablet (2 mg total) by mouth every 12 (twelve) hours as needed for severe pain.   20 tablet   0   . lisinopril (PRINIVIL,ZESTRIL) 40 MG tablet   Oral   Take 40 mg by mouth daily.         . Melatonin 3 MG TABS   Oral   Take 3 mg by mouth at bedtime as needed (sleep).         . Multiple Vitamins-Minerals (MULTIVITAMIN WITH MINERALS) tablet   Oral   Take 1 tablet by mouth daily.         . Omega-3 Fatty Acids (FISH OIL) 1000 MG CAPS   Oral   Take 1,000 mg by mouth daily.         Marland Kitchen oxyCODONE (OXY IR/ROXICODONE) 5 MG immediate release tablet  Oral   Take 1 tablet (5 mg total) by mouth every 6 (six) hours as needed for moderate pain, severe pain or breakthrough pain.   30 tablet   0   . promethazine (PHENERGAN) 25 MG tablet   Oral   Take 25 mg by mouth every 6 (six) hours as needed for nausea or vomiting.         . tamsulosin (FLOMAX) 0.4 MG CAPS capsule   Oral   Take 1 capsule (0.4 mg total) by mouth daily after supper.   30 capsule   0   . zolpidem (AMBIEN) 5 MG tablet   Oral   Take 2.5-5 mg by mouth at bedtime as needed for sleep.            Allergies Penicillins  Family History  Problem Relation Age of Onset  . Bladder Cancer Neg Hx   . Kidney cancer Neg Hx   . Prostate cancer Neg Hx     Social History Social History  Substance Use Topics  . Smoking status: Never Smoker   . Smokeless tobacco: None  . Alcohol Use: Yes     Comment:  regular, several times per week, liquor    Review of Systems  Constitutional: Negative for fever. Eyes: Negative for visual changes. ENT: Negative for sore throat. Cardiovascular: Negative for chest pain. Respiratory: Negative for shortness of breath. Gastrointestinal: Positive for abdominal pain and nausea Genitourinary: Negative for dysuria. Musculoskeletal: Negative for back pain. Skin: Negative for rash. Neurological: Negative for headaches, focal weakness or numbness.   10-point ROS otherwise negative.  ____________________________________________   PHYSICAL EXAM:  VITAL SIGNS: ED Triage Vitals  Enc Vitals Group     BP 01/02/16 0408 198/127 mmHg     Pulse Rate 01/02/16 0408 90     Resp 01/02/16 0408 18     Temp 01/02/16 0410 97.9 F (36.6 C)     Temp src --      SpO2 01/02/16 0408 100 %     Weight 01/02/16 0408 182 lb (82.555 kg)     Height 01/02/16 0408 6' (1.829 m)     Head Cir --      Peak Flow --      Pain Score 01/02/16 0408 9     Pain Loc --      Pain Edu? --      Excl. in [place]? --      Constitutional: Alert and oriented. Apparent discomfort Eyes: Conjunctivae are normal. PERRL. Normal extraocular movements. ENT   Head: Normocephalic and atraumatic.   Nose: No congestion/rhinnorhea.   Mouth/Throat: Mucous membranes are moist.   Neck: No stridor. Hematological/Lymphatic/Immunilogical: No cervical lymphadenopathy. Cardiovascular: Normal rate, regular rhythm. Normal and symmetric distal pulses are present in all extremities. No murmurs, rubs, or gallops. Respiratory: Normal respiratory effort without tachypnea nor retractions. Breath sounds are clear and equal bilaterally. No wheezes/rales/rhonchi. Gastrointestinal: Soft and nontender. No distention. There is no CVA tenderness. Genitourinary: deferred Musculoskeletal: Nontender with normal range of motion in all extremities. No joint effusions.  No lower extremity tenderness nor  edema. Neurologic:  Normal speech and language. No gross focal neurologic deficits are appreciated. Speech is normal.  Skin:  Skin is warm, dry and intact. No rash noted. Psychiatric: Mood and affect are normal. Speech and behavior are normal. Patient exhibits appropriate insight and judgment.  ____________________________________________    LABS (pertinent positives/negatives)  Labs Reviewed  CBC WITH DIFFERENTIAL/PLATELET - Abnormal; Notable for the following:    HCT 37.9 (*)  Neutro Abs 8.4 (*)    Lymphs Abs 0.9 (*)    All other components within normal limits  COMPREHENSIVE METABOLIC PANEL - Abnormal; Notable for the following:    Potassium 3.1 (*)    Chloride 97 (*)    Glucose, Bld 153 (*)    BUN 25 (*)    Creatinine, Ser 1.30 (*)    GFR calc non Af Amer 54 (*)    All other components within normal limits  URINALYSIS COMPLETEWITH MICROSCOPIC ([facility] ONLY) - Abnormal; Notable for the following:    Color, Urine YELLOW (*)    APPearance CLOUDY (*)    Glucose, UA 50 (*)    Hgb urine dipstick 3+ (*)    Protein, ur >500 (*)    Bacteria, UA RARE (*)    Squamous Epithelial / LPF 0-5 (*)    All other components within normal limits  LIPASE, BLOOD  LACTIC ACID, PLASMA     ____________________________________________   EKG  ED ECG REPORT I, [place] Jayesh Marbach, the attending physician, personally viewed and interpreted this ECG.   Date: 01/02/2016  EKG Time: 4:44 AM  Rate: 77  Rhythm: Normal sinus rhythm  Axis: Normal  Intervals: Normal  ST&T Change: Normal  ____________________________________________    RADIOLOGY     CT CTA Abd/Pel w/cm &/or w/o cm (Final result) Result time: 01/02/16 05:21:11   Final result by Rad Results In Interface (01/02/16 05:21:11)   Narrative:   CLINICAL DATA: Mid abdominal pain, onset this morning.  EXAM: CTA ABDOMEN AND PELVIS wITHOUT AND WITH CONTRAST  TECHNIQUE: Multidetector CT imaging of the abdomen and pelvis was  performed using the standard protocol during bolus administration of intravenous contrast. Multiplanar reconstructed images and MIPs were obtained and reviewed to evaluate the vascular anatomy.  CONTRAST: 100 mL Isovue 370 intravenous  COMPARISON: 12/25/2015  FINDINGS: The abdominal aorta is normal in caliber. 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 The dissection of the celiac axis, common hepatic artery and splenic artery is unchanged. The superior mesenteric artery and inferior mesenteric artery are patent. The renal arteries are patent, a single right renal artery and both left renal arteries. Iliac arteries are patent.  Review of the MIP images confirms the above findings.  There is persistent right hydronephrosis. The right ureteral calculus is no longer evident although there continues to be mild ureterectasis periureteral stranding suggesting recent stone passage.  Right upper pole renal cyst again noted.  There are unremarkable appearances of the liver, spleen, pancreas and adrenals. Left kidney is unremarkable. Bowel is unremarkable. Appendix is normal. Mild colonic diverticulosis.  No significant abnormality in the lower chest. No significant musculoskeletal abnormality.  IMPRESSION: 1. The right ureteral calculus is no longer evident and it may have been recently passed, as there still is a degree of periureteral stranding and ureterectasis on the right. 2. 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   Electronically Signed By: Andreas Newport M.D. On: 01/02/2016 05:21        ECG Results         INITIAL IMPRESSION / ASSESSMENT AND PLAN / ED COURSE  Pertinent labs & imaging results that were available during my care of the patient were reviewed by me and considered in my medical decision making (see chart for details). Given history and physical  exam findings concern for possible aortic/ureteral spasms as etiology for the patient's pain. As such CT angiogram of  the abdomen and pelvis was performed which revealed a chronic celiac trunk dissection unchanged from previous study. Patient received 2 doses of IV Dilaudid in the emergency department with continued pain. Patient requesting hospital admission for intractable pain. Patient discussed with Dr. Erlene Quan urologist on call. Patient then discussed with Dr. Marcille Blanco hospitalist on call for hospital admission for intractable pain.  ____________________________________________   FINAL CLINICAL IMPRESSION(S) / ED DIAGNOSES  Final diagnoses:  Intractable abdominal pain      Gregor Hams, MD 01/02/16 [id number]

## 2016-01-02 NOTE — Progress Notes (Signed)
[place] [facility] [place] NAME: Andre Stone    MR#:  [id number]  DATE OF BIRTH:  1946/09/10  SUBJECTIVE:  CHIEF COMPLAINT:   Chief Complaint  Patient presents with  . Abdominal Pain   That her abdominal pain and flank pain . REVIEW OF SYSTEMS:  CONSTITUTIONAL: No fever, fatigue or weakness.  EYES: No blurred or double vision.  EARS, NOSE, AND THROAT: No tinnitus or ear pain.  RESPIRATORY: No cough, shortness of breath, wheezing or hemoptysis.  CARDIOVASCULAR: No chest pain, orthopnea, edema.  GASTROINTESTINAL: No nausea, vomiting, diarrhea but has abdominal pain.  GENITOURINARY: No dysuria, hematuria.  ENDOCRINE: No polyuria, nocturia,  HEMATOLOGY: No anemia, easy bruising or bleeding SKIN: No rash or lesion. MUSCULOSKELETAL: No joint pain or arthritis.   NEUROLOGIC: No tingling, numbness, weakness.  PSYCHIATRY: No anxiety or depression.   VITALS:  Blood pressure 123/72, pulse 76, temperature 97.6 F (36.4 C), temperature source Oral, resp. rate 18, height 6' (1.829 m), weight 82.555 kg (182 lb), SpO2 97 %.  PHYSICAL EXAMINATION:  GENERAL:  70 y.o.-year-old patient lying in the bed with no acute distress.  EYES: Pupils equal, round, reactive to light and accommodation. No scleral icterus. Extraocular muscles intact.  HEENT: Head atraumatic, normocephalic. Oropharynx and nasopharynx clear.  NECK:  Supple, no jugular venous distention. No thyroid enlargement, no tenderness.  LUNGS: Normal breath sounds bilaterally, no wheezing, rales,rhonchi or crepitation. No use of accessory muscles of respiration.  CARDIOVASCULAR: S1, S2 normal. No murmurs, rubs, or gallops.  ABDOMEN: Soft, nontender, nondistended. Bowel sounds present. No organomegaly or mass.  EXTREMITIES: No pedal edema, cyanosis, or clubbing.  NEUROLOGIC: Cranial nerves II through XII are intact. Muscle strength 5/5 in all extremities. Sensation intact. Gait not checked.   PSYCHIATRIC: The patient is alert and oriented x 3.  SKIN: No obvious rash, lesion, or ulcer.    LABORATORY PANEL:   CBC  Recent Labs Lab 01/02/16 0412  WBC 10.0  HGB 13.0  HCT 37.9*  PLT 261   ------------------------------------------------------------------------------------------------------------------  Chemistries   Recent Labs Lab 01/02/16 0412  NA 136  K 3.1*  CL 97*  CO2 27  GLUCOSE 153*  BUN 25*  CREATININE 1.30*  CALCIUM 9.1  AST 29  ALT 28  ALKPHOS 51  BILITOT 0.5   ------------------------------------------------------------------------------------------------------------------  Cardiac Enzymes No results for input(s): TROPONINI in the last 168 hours. ------------------------------------------------------------------------------------------------------------------  RADIOLOGY:  Ct Cta Abd/pel W/cm &/or W/o Cm  01/02/2016  CLINICAL DATA:  Mid abdominal pain, onset this morning. EXAM: CTA ABDOMEN AND PELVIS wITHOUT AND WITH CONTRAST TECHNIQUE: Multidetector CT imaging of the abdomen and pelvis was performed using the standard protocol during bolus administration of intravenous contrast. Multiplanar reconstructed images and MIPs were obtained and reviewed to evaluate the vascular anatomy. CONTRAST:  100 mL Isovue 370 intravenous COMPARISON:  12/25/2015 FINDINGS: The abdominal aorta is normal in caliber. 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 The dissection of the celiac axis, common hepatic artery and splenic artery is unchanged. The superior mesenteric artery and inferior mesenteric artery are patent. The renal arteries are patent, a single right renal artery and both left renal arteries. Iliac arteries are patent. Review of the MIP images confirms the above findings. There is persistent right hydronephrosis. The right ureteral calculus is no longer evident although there continues to be mild ureterectasis periureteral stranding  suggesting recent stone passage. Right upper pole renal cyst again noted. There are unremarkable appearances  of the liver, spleen, pancreas and adrenals. Left kidney is unremarkable. Bowel is unremarkable. Appendix is normal. Mild colonic diverticulosis. No significant abnormality in the lower chest. No significant musculoskeletal abnormality. IMPRESSION: 1. The right ureteral calculus is no longer evident and it may have been recently passed, as there still is a degree of periureteral stranding and ureterectasis on the right. 2. 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 Electronically Signed   By: Andreas Newport M.D.   On: 01/02/2016 05:21    EKG:   Orders placed or performed during the hospital encounter of 01/02/16  . EKG 12-Lead  . EKG 12-Lead  . ED EKG  . ED EKG    ASSESSMENT AND PLAN:   1. Pain: Intractable with home oral medication.  Continue IV Dilaudid and Percocet when necessary. No stones seen on CT scan.   2. Essential hypertension: Continue lisinopril and chlorothiazide 3. GERD: Continue pantoprazole per home regimen 4. DVT prophylaxis: Lovenox 5. GI prophylaxis: As above  Hypokalemia. Give potassium supplement and follow-up level. CKD stage III. Stable and better than baseline.   All the records are reviewed and case discussed with Care Management/Social Workerr. Management plans discussed with the patient, his wife and they are in agreement.  CODE STATUS: Full code  TOTAL TIME TAKING CARE OF THIS PATIENT: 37 minutes.  Greater than 50% time was spent on coordination of care and face-to-face counseling.  POSSIBLE D/C IN 1 DAYS, DEPENDING ON CLINICAL CONDITION.   Demetrios Loll M.D on 01/02/2016 at 3:27 PM  Between 7am to 6pm - Pager - [phone]  After 6pm go to [email or web address] - password [email or web address]  Office  [phone]  CC: Primary care physician; Dion Body, MD

## 2016-01-02 NOTE — ED Notes (Signed)
Hospitalist at bedside 

## 2016-01-02 NOTE — ED Notes (Addendum)
Pt c/o mid abdominal pain since this morning, pt reports began in the back and abdominal pain but pain is now located only in mid abdomen. Pt has hx of kidney stones. Had lithotripsy performed on 12/29/15. Pt states took out kidney stent this morning and began having stomach spasm afterward. Reports taking Dilaudid tab at home without relief. Pt moaning and groaning in pain, sitting huntched over. Pt denies chest pain, shortness of breath, or dizziness. Pt reports has chronic dissection. Pt alert and oriented x 4, no increased work in breathing noted. Skin warm and dry. Will continue to monitor, call bell within reach. Spouse at bedside.

## 2016-01-02 NOTE — H&P (Signed)
Andre Stone is an 70 y.o. male.   Chief Complaint: Abdominal pain HPI: The patient with past medical history of hypertension presents to the emergency department complaining of abdominal pain. He recently underwent ureteral stent placement for a kidney stone and remove the stent this morning. Since that time he's had progressively worsening abdominal pain which she states is colicky in nature. He denies fevers, chills, nausea or vomiting. He states that his pain medicine is not working and that the pain is too hard to bear which prompted the emergency department staff to call for admission.  Past Medical History  Diagnosis Date  . Hypertension   . History of kidney stones   . Essential (primary) hypertension 12/28/2015  . Community acquired pneumonia 12/25/2015  . Gastro-esophageal reflux disease without esophagitis 12/28/2015  . 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 Apnea, sleep 12/28/2015    Past Surgical History  Procedure Laterality Date  . Cholecystectomy    . Tonsillectomy    . Cystoscopy/ureteroscopy/holmium laser/stent placement Right 12/29/2015    Procedure: CYSTOSCOPY/URETEROSCOPY/ with basketing of stone, right ureteral /STENT PLACEMENT;  Surgeon: [name], MD;  Location: [facility] ORS;  Service: Urology;  Laterality: Right;    Family History  Problem Relation Age of Onset  . Bladder Cancer Neg Hx   . Kidney cancer Neg Hx   . Prostate cancer Neg Hx   . Stroke Mother   . AAA (abdominal aortic aneurysm) Father   . Hypertension Father    Social History:  reports that he has never smoked. He does not have any smokeless tobacco history on file. He reports that he drinks alcohol. He reports that he does not use illicit drugs.  Allergies:  Allergies  Allergen Reactions  . Penicillins Rash and Other (See Comments)    Childhood allergy.  Has patient had a PCN reaction causing immediate rash, facial/tongue/throat swelling, SOB or lightheadedness with hypotension: no Has  patient had a PCN reaction causing severe rash involving mucus membranes or skin necrosis: no Has patient had a PCN reaction that required hospitalization no Has patient had a PCN reaction occurring within the last 10 years: no If all of the above answers are "NO", then may proceed with Cephalosporin use.     Prior to Admission medications   Medication Sig Start Date End Date Taking? Authorizing Provider  aspirin EC 81 MG tablet Take 81 mg by mouth daily.   Yes Historical Provider, MD  cholecalciferol (VITAMIN D) 1000 units tablet Take 2,000 Units by mouth daily.   Yes Historical Provider, MD  docusate sodium (COLACE) 100 MG capsule Take 100 mg by mouth 2 (two) times daily as needed for mild constipation.   Yes Historical Provider, MD  esomeprazole (NEXIUM) 20 MG capsule Take 20 mg by mouth daily at 12 noon.   Yes Historical Provider, MD  hydrochlorothiazide (HYDRODIURIL) 25 MG tablet Take 25 mg by mouth daily.   Yes Historical Provider, MD  HYDROmorphone (DILAUDID) 2 MG tablet Take 1 tablet (2 mg total) by mouth every 12 (twelve) hours as needed for severe pain. 12/27/15 12/26/16 Yes Earleen Newport, MD  lisinopril (PRINIVIL,ZESTRIL) 40 MG tablet Take 40 mg by mouth daily.   Yes Historical Provider, MD  Melatonin 3 MG TABS Take 3 mg by mouth at bedtime as needed (sleep).   Yes Historical Provider, MD  Multiple Vitamins-Minerals (MULTIVITAMIN WITH MINERALS) tablet Take 1 tablet by mouth daily.   Yes Historical Provider, MD  Omega-3 Fatty Acids (FISH OIL) 1000 MG CAPS  Take 1,000 mg by mouth daily.   Yes Historical Provider, MD  oxyCODONE (OXY IR/ROXICODONE) 5 MG immediate release tablet Take 1 tablet (5 mg total) by mouth every 6 (six) hours as needed for moderate pain, severe pain or breakthrough pain. 12/26/15  Yes Loletha Grayer, MD  promethazine (PHENERGAN) 25 MG tablet Take 25 mg by mouth every 6 (six) hours as needed for nausea or vomiting.   Yes Historical Provider, MD  tamsulosin (FLOMAX)  0.4 MG CAPS capsule Take 1 capsule (0.4 mg total) by mouth daily after supper. 12/26/15  Yes Richard Leslye Peer, MD  zolpidem (AMBIEN) 5 MG tablet Take 2.5-5 mg by mouth at bedtime as needed for sleep.    Yes Historical Provider, MD     Results for orders placed or performed during the hospital encounter of 01/02/16 (from the past 48 hour(s))  CBC with Differential     Status: Abnormal   Collection Time: 01/02/16  4:12 AM  Result Value Ref Range   WBC 10.0 3.8 - 10.6 K/uL   RBC 4.42 4.40 - 5.90 MIL/uL   Hemoglobin 13.0 13.0 - 18.0 g/dL   HCT 37.9 (L) 40.0 - 52.0 %   MCV 85.6 80.0 - 100.0 fL   MCH 29.4 26.0 - 34.0 pg   MCHC 34.4 32.0 - 36.0 g/dL   RDW 13.6 11.5 - 14.5 %   Platelets 261 150 - 440 K/uL   Neutrophils Relative % 84 %   Neutro Abs 8.4 (H) 1.4 - 6.5 K/uL   Lymphocytes Relative 9 %   Lymphs Abs 0.9 (L) 1.0 - 3.6 K/uL   Monocytes Relative 4 %   Monocytes Absolute 0.4 0.2 - 1.0 K/uL   Eosinophils Relative 2 %   Eosinophils Absolute 0.2 0 - 0.7 K/uL   Basophils Relative 1 %   Basophils Absolute 0.1 0 - 0.1 K/uL  Comprehensive metabolic panel     Status: Abnormal   Collection Time: 01/02/16  4:12 AM  Result Value Ref Range   Sodium 136 135 - 145 mmol/L   Potassium 3.1 (L) 3.5 - 5.1 mmol/L   Chloride 97 (L) 101 - 111 mmol/L   CO2 27 22 - 32 mmol/L   Glucose, Bld 153 (H) 65 - 99 mg/dL   BUN 25 (H) 6 - 20 mg/dL   Creatinine, Ser 1.30 (H) 0.61 - 1.24 mg/dL   Calcium 9.1 8.9 - 10.3 mg/dL   Total Protein 7.7 6.5 - 8.1 g/dL   Albumin 4.3 3.5 - 5.0 g/dL   AST 29 15 - 41 U/L   ALT 28 17 - 63 U/L   Alkaline Phosphatase 51 38 - 126 U/L   Total Bilirubin 0.5 0.3 - 1.2 mg/dL   GFR calc non Af Amer 54 (L) >60 mL/min   GFR calc Af Amer >60 >60 mL/min    Comment: (NOTE) The eGFR has been calculated using the CKD EPI equation. This calculation has not been validated in all clinical situations. eGFR's persistently <60 mL/min signify possible Chronic Kidney Disease.    Anion gap 12  5 - 15  Lipase, blood     Status: None   Collection Time: 01/02/16  4:12 AM  Result Value Ref Range   Lipase 45 11 - 51 U/L  Urinalysis complete, with microscopic ([facility] only)     Status: Abnormal   Collection Time: 01/02/16  5:39 AM  Result Value Ref Range   Color, Urine YELLOW (A) YELLOW   APPearance CLOUDY (A) CLEAR  Glucose, UA 50 (A) NEGATIVE mg/dL   Bilirubin Urine NEGATIVE NEGATIVE   Ketones, ur NEGATIVE NEGATIVE mg/dL   Specific Gravity, Urine 1.030 1.005 - 1.030   Hgb urine dipstick 3+ (A) NEGATIVE   pH 7.0 5.0 - 8.0   Protein, ur >500 (A) NEGATIVE mg/dL   Nitrite NEGATIVE NEGATIVE   Leukocytes, UA NEGATIVE NEGATIVE   RBC / HPF TOO NUMEROUS TO COUNT 0 - 5 RBC/hpf   WBC, UA 6-30 0 - 5 WBC/hpf   Bacteria, UA RARE (A) NONE SEEN   Squamous Epithelial / LPF 0-5 (A) NONE SEEN   Mucous PRESENT    Amorphous Crystal PRESENT   Lactic acid, plasma     Status: None   Collection Time: 01/02/16  5:55 AM  Result Value Ref Range   Lactic Acid, Venous 1.0 0.5 - 2.0 mmol/L   Ct Cta Abd/pel W/cm &/or W/o Cm  01/02/2016  CLINICAL DATA:  Mid abdominal pain, onset this morning. EXAM: CTA ABDOMEN AND PELVIS wITHOUT AND WITH CONTRAST TECHNIQUE: Multidetector CT imaging of the abdomen and pelvis was performed using the standard protocol during bolus administration of intravenous contrast. Multiplanar reconstructed images and MIPs were obtained and reviewed to evaluate the vascular anatomy. CONTRAST:  100 mL Isovue 370 intravenous COMPARISON:  12/25/2015 FINDINGS: The abdominal aorta is normal in caliber. 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 The dissection of the celiac axis, common hepatic artery and splenic artery is unchanged. The superior mesenteric artery and inferior mesenteric artery are patent. The renal arteries are patent, a single right renal artery and both left renal arteries. Iliac arteries are patent. Review of the MIP images confirms the above findings.  There is persistent right hydronephrosis. The right ureteral calculus is no longer evident although there continues to be mild ureterectasis periureteral stranding suggesting recent stone passage. Right upper pole renal cyst again noted. There are unremarkable appearances of the liver, spleen, pancreas and adrenals. Left kidney is unremarkable. Bowel is unremarkable. Appendix is normal. Mild colonic diverticulosis. No significant abnormality in the lower chest. No significant musculoskeletal abnormality. IMPRESSION: 1. The right ureteral calculus is no longer evident and it may have been recently passed, as there still is a degree of periureteral stranding and ureterectasis on the right. 2. 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 Electronically Signed   By: Andreas Newport M.D.   On: 01/02/2016 05:21    Review of Systems  Constitutional: Negative for fever and chills.  HENT: Negative for sore throat and tinnitus.   Eyes: Negative for blurred vision and redness.  Respiratory: Negative for cough and shortness of breath.   Cardiovascular: Negative for chest pain, palpitations, orthopnea and PND.  Gastrointestinal: Positive for abdominal pain. Negative for nausea, vomiting and diarrhea.  Genitourinary: Negative for dysuria, urgency and frequency.  Musculoskeletal: Negative for myalgias and joint pain.  Skin: Negative for rash.       No lesions  Neurological: Negative for speech change, focal weakness and weakness.  Endo/Heme/Allergies: Does not bruise/bleed easily.       No temperature intolerance  Psychiatric/Behavioral: Negative for depression and suicidal ideas.    Blood pressure 114/74, pulse 83, temperature 97.9 F (36.6 C), resp. rate 14, height 6' (1.829 m), weight 82.555 kg (182 lb), SpO2 94 %. Physical Exam  Nursing note and vitals reviewed. Constitutional: He is oriented to person, place, and time. He appears  well-developed and well-nourished. No distress.  HENT:  Head: Normocephalic and atraumatic.  Mouth/Throat: Oropharynx is clear and moist.  Eyes: Conjunctivae and EOM are normal. Pupils are equal, round, and reactive to light. No scleral icterus.  Neck: Normal range of motion. Neck supple. No JVD present. No tracheal deviation present. No thyromegaly present.  Cardiovascular: Normal rate, regular rhythm and normal heart sounds.  Exam reveals no gallop and no friction rub.   No murmur heard. Respiratory: Effort normal and breath sounds normal. No respiratory distress.  GI: Soft. Bowel sounds are normal. He exhibits no distension. There is no tenderness.  Musculoskeletal: Normal range of motion. He exhibits no edema.  Lymphadenopathy:    He has no cervical adenopathy.  Neurological: He is alert and oriented to person, place, and time. No cranial nerve deficit.  Skin: Skin is warm and dry. No rash noted. No erythema.  Psychiatric: He has a normal mood and affect. His behavior is normal. Judgment and thought content normal.     Assessment/Plan This is a 70 year old male admitted for intractable pain. 1. Pain: Intractable with home oral medication. I have adjusted the patient's pain regimen for IV analgesics for breakthrough pain and Percocet for moderate pain relief and anti-inflammatory effect. No stones seen on CT scan. Likely etiology is urethral spasm. Urology consult by phone in the emergency department. Formal consult at the discretion of the primary team. 2. Essential hypertension: Continue lisinopril and chlorothiazide 3. GERD: Continue pantoprazole per home regimen 4. DVT prophylaxis: Lovenox 5. GI prophylaxis: As above The patient is a full code. Time spent on admission orders and patient care approximately 45 minutes  Harrie Foreman, MD 01/02/2016, 7:26 AM

## 2016-01-03 ENCOUNTER — Telehealth: Payer: Self-pay | Admitting: Urology

## 2016-01-03 LAB — BASIC METABOLIC PANEL
Anion gap: 6 (ref 5–15)
BUN: 22 mg/dL — AB (ref 6–20)
CO2: 25 mmol/L (ref 22–32)
CREATININE: 1.09 mg/dL (ref 0.61–1.24)
Calcium: 8.2 mg/dL — ABNORMAL LOW (ref 8.9–10.3)
Chloride: 107 mmol/L (ref 101–111)
GFR calc non Af Amer: >60 mL/min (ref >60)
GLUCOSE: 101 mg/dL — AB (ref 65–99)
Potassium: 3.8 mmol/L (ref 3.5–5.1)
SODIUM: 138 mmol/L (ref 135–145)

## 2016-01-03 LAB — HEMOGLOBIN A1C: Hgb A1c MFr Bld: 5.7 % (ref 4.0–6.0)

## 2016-01-03 NOTE — Discharge Summary (Signed)
[place] [facility] [place] NAME: Andre Stone    MR#:  [id number]  DATE OF BIRTH:  [birth year]  DATE OF ADMISSION:  01/02/2016 ADMITTING PHYSICIAN: Harrie Foreman, MD  DATE OF DISCHARGE: 01/03/2016 PRIMARY CARE PHYSICIAN: Dion Body, MD    ADMISSION DIAGNOSIS:  Intractable abdominal pain [R10.9]   DISCHARGE DIAGNOSIS:   Abdominal Pain with hematuria AKI due to nephrolithiasis SECONDARY DIAGNOSIS:   Past Medical History  Diagnosis Date  . Hypertension   . History of kidney stones   . Essential (primary) hypertension 12/28/2015  . Community acquired pneumonia 12/25/2015  . Gastro-esophageal reflux disease without esophagitis 12/28/2015  . 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 Apnea, sleep 12/28/2015    HOSPITAL COURSE:  The patient with past medical history of hypertension presents to the emergency department complaining of abdominal pain. He recently underwent ureteral stent placement for a kidney stone and removed the stent.  1. Abdominal Pain with hematuria: Intractable with home oral medication. Possible due to ureter spasm after stent removal. Improved. He was treated with IV Dilaudid and Percocet when necessary. No stones seen on CT scan.   2. Essential hypertension: Continue lisinopril and chlorothiazide 3. GERD: Continue pantoprazole per home regimen  Hypokalemia. Improved with potassium supplement. AKI due to nephrolithiasis. Improved.  DISCHARGE CONDITIONS:   Stable, discharge to home today.  DISCHARGE MEDICATIONS:   Current Discharge Medication List    CONTINUE these medications which have NOT CHANGED   Details  aspirin EC 81 MG tablet Take 81 mg by mouth daily.    cholecalciferol (VITAMIN D) 1000 units tablet Take 2,000 Units by mouth daily.    docusate sodium (COLACE) 100 MG capsule Take 100 mg by mouth 2 (two) times daily as needed for mild constipation.    esomeprazole (NEXIUM) 20 MG capsule  Take 20 mg by mouth daily at 12 noon.    hydrochlorothiazide (HYDRODIURIL) 25 MG tablet Take 25 mg by mouth daily.    HYDROmorphone (DILAUDID) 2 MG tablet Take 1 tablet (2 mg total) by mouth every 12 (twelve) hours as needed for severe pain. Qty: 20 tablet, Refills: 0    lisinopril (PRINIVIL,ZESTRIL) 40 MG tablet Take 40 mg by mouth daily.    Melatonin 3 MG TABS Take 3 mg by mouth at bedtime as needed (sleep).    Multiple Vitamins-Minerals (MULTIVITAMIN WITH MINERALS) tablet Take 1 tablet by mouth daily.    Omega-3 Fatty Acids (FISH OIL) 1000 MG CAPS Take 1,000 mg by mouth daily.    oxyCODONE (OXY IR/ROXICODONE) 5 MG immediate release tablet Take 1 tablet (5 mg total) by mouth every 6 (six) hours as needed for moderate pain, severe pain or breakthrough pain. Qty: 30 tablet, Refills: 0    promethazine (PHENERGAN) 25 MG tablet Take 25 mg by mouth every 6 (six) hours as needed for nausea or vomiting.    tamsulosin (FLOMAX) 0.4 MG CAPS capsule Take 1 capsule (0.4 mg total) by mouth daily after supper. Qty: 30 capsule, Refills: 0    zolpidem (AMBIEN) 5 MG tablet Take 2.5-5 mg by mouth at bedtime as needed for sleep.          DISCHARGE INSTRUCTIONS:    If you experience worsening of your admission symptoms, develop shortness of breath, life threatening emergency, suicidal or homicidal thoughts you must seek medical attention immediately by calling 911 or calling your MD immediately  if symptoms less severe.  You Must read complete instructions/literature along with all the  possible adverse reactions/side effects for all the Medicines you take and that have been prescribed to you. Take any new Medicines after you have completely understood and accept all the possible adverse reactions/side effects.   Please note  You were cared for by a hospitalist during your hospital stay. If you have any questions about your discharge medications or the care you received while you were in the  hospital after you are discharged, you can call the unit and asked to speak with the hospitalist on call if the hospitalist that took care of you is not available. Once you are discharged, your primary care physician will handle any further medical issues. Please note that NO REFILLS for any discharge medications will be authorized once you are discharged, as it is imperative that you return to your primary care physician (or establish a relationship with a primary care physician if you do not have one) for your aftercare needs so that they can reassess your need for medications and monitor your lab values.    Today   SUBJECTIVE    No complaint.  VITAL SIGNS:  Blood pressure 154/85, pulse 83, temperature 98.3 F (36.8 C), temperature source Oral, resp. rate 18, height 6' (1.829 m), weight 85.458 kg (188 lb 6.4 oz), SpO2 99 %.  I/O:   Intake/Output Summary (Last 24 hours) at 01/03/16 1049 Last data filed at 01/03/16 0941  Gross per 24 hour  Intake    480 ml  Output      0 ml  Net    480 ml    PHYSICAL EXAMINATION:  GENERAL:  70 y.o.-year-old patient lying in the bed with no acute distress.  EYES: Pupils equal, round, reactive to light and accommodation. No scleral icterus. Extraocular muscles intact.  HEENT: Head atraumatic, normocephalic. Oropharynx and nasopharynx clear.  NECK:  Supple, no jugular venous distention. No thyroid enlargement, no tenderness.  LUNGS: Normal breath sounds bilaterally, no wheezing, rales,rhonchi or crepitation. No use of accessory muscles of respiration.  CARDIOVASCULAR: S1, S2 normal. No murmurs, rubs, or gallops.  ABDOMEN: Soft, non-tender, non-distended. Bowel sounds present. No organomegaly or mass.  EXTREMITIES: No pedal edema, cyanosis, or clubbing.  NEUROLOGIC: Cranial nerves II through XII are intact. Muscle strength 5/5 in all extremities. Sensation intact. Gait not checked.  PSYCHIATRIC: The patient is alert and oriented x 3.  SKIN: No obvious  rash, lesion, or ulcer.   DATA REVIEW:   CBC  Recent Labs Lab 01/02/16 0412  WBC 10.0  HGB 13.0  HCT 37.9*  PLT 261    Chemistries   Recent Labs Lab 01/02/16 0412 01/02/16 1614 01/03/16 0539  NA 136  --  138  K 3.1*  --  3.8  CL 97*  --  107  CO2 27  --  25  GLUCOSE 153*  --  101*  BUN 25*  --  22*  CREATININE 1.30*  --  1.09  CALCIUM 9.1  --  8.2*  MG  --  1.9  --   AST 29  --   --   ALT 28  --   --   ALKPHOS 51  --   --   BILITOT 0.5  --   --     Cardiac Enzymes No results for input(s): TROPONINI in the last 168 hours.  Microbiology Results  Results for orders placed or performed in visit on 12/28/15  Microscopic Examination     Status: Abnormal   Collection Time: 12/28/15 10:20 AM  Result Value  Ref Range Status   WBC, UA 6-10 (A) 0 -  5 /hpf Final   RBC, UA >30 (A) 0 -  2 /hpf Final   Epithelial Cells (non renal) 0-10 0 - 10 /hpf Final   Bacteria, UA None seen None seen/Few Final    RADIOLOGY:  Ct Cta Abd/pel W/cm &/or W/o Cm  01/02/2016  CLINICAL DATA:  Mid abdominal pain, onset this morning. EXAM: CTA ABDOMEN AND PELVIS wITHOUT AND WITH CONTRAST TECHNIQUE: Multidetector CT imaging of the abdomen and pelvis was performed using the standard protocol during bolus administration of intravenous contrast. Multiplanar reconstructed images and MIPs were obtained and reviewed to evaluate the vascular anatomy. CONTRAST:  100 mL Isovue 370 intravenous COMPARISON:  12/25/2015 FINDINGS: The abdominal aorta is normal in caliber. 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 The dissection of the celiac axis, common hepatic artery and splenic artery is unchanged. The superior mesenteric artery and inferior mesenteric artery are patent. The renal arteries are patent, a single right renal artery and both left renal arteries. Iliac arteries are patent. Review of the MIP images confirms the above findings. There is persistent right hydronephrosis. The  right ureteral calculus is no longer evident although there continues to be mild ureterectasis periureteral stranding suggesting recent stone passage. Right upper pole renal cyst again noted. There are unremarkable appearances of the liver, spleen, pancreas and adrenals. Left kidney is unremarkable. Bowel is unremarkable. Appendix is normal. Mild colonic diverticulosis. No significant abnormality in the lower chest. No significant musculoskeletal abnormality. IMPRESSION: 1. The right ureteral calculus is no longer evident and it may have been recently passed, as there still is a degree of periureteral stranding and ureterectasis on the right. 2. 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 Electronically Signed   By: Andreas Newport M.D.   On: 01/02/2016 05:21        Management plans discussed with the patient, family and they are in agreement.  CODE STATUS:     Code Status Orders        Start     Ordered   01/02/16 0847  Full code   Continuous     01/02/16 0846    Code Status History    Date Active Date Inactive Code Status Order ID Comments User Context   12/25/2015  9:21 AM 12/26/2015  8:53 PM Full Code [id number]  Saundra Shelling, MD Inpatient      TOTAL TIME TAKING CARE OF THIS PATIENT: 22 minutes.    Demetrios Loll M.D on 01/03/2016 at 10:49 AM  Between 7am to 6pm - Pager - [phone]  After 6pm go to [email or web address] - password [email or web address] [facility]  Office  [phone]  CC: Primary care physician; Dion Body, MD

## 2016-01-03 NOTE — Discharge Instructions (Signed)
Heart healthy diet. °Activity as tolerated. °

## 2016-01-03 NOTE — Progress Notes (Signed)
Pt discharged home ambulatory without pain or nausea

## 2016-01-03 NOTE — Progress Notes (Signed)
Pt. Having bloody urine. Dr. Marcille Blanco notified and said to expect urine to be bloody d/t stent being removed yesterday morning.

## 2016-01-04 ENCOUNTER — Ambulatory Visit: Admission: RE | Admit: 2016-01-04 | Payer: Medicare Other | Source: Ambulatory Visit | Admitting: Urology

## 2016-01-04 ENCOUNTER — Encounter: Admission: RE | Payer: Self-pay | Source: Ambulatory Visit

## 2016-01-04 SURGERY — LITHOTRIPSY, ESWL
Anesthesia: Moderate Sedation | Laterality: Right

## 2016-01-08 LAB — STONE ANALYSIS
CA OXALATE, MONOHYDR.: 15 %
CA PHOS CRY STONE QL IR: 2 %
Stone Weight KSTONE: 22 mg
Uric Acid: 83 %

## 2016-01-10 ENCOUNTER — Ambulatory Visit: Payer: Self-pay | Admitting: Urology

## 2016-01-30 ENCOUNTER — Ambulatory Visit: Payer: Medicare Other

## 2016-02-13 NOTE — Telephone Encounter (Signed)
error 

## 2016-07-24 DIAGNOSIS — Z862 Personal history of diseases of the blood and blood-forming organs and certain disorders involving the immune mechanism: Secondary | ICD-10-CM | POA: Insufficient documentation

## 2017-04-16 DIAGNOSIS — E782 Mixed hyperlipidemia: Secondary | ICD-10-CM | POA: Insufficient documentation

## 2018-01-01 HISTORY — PX: COLONOSCOPY: SHX174

## 2018-05-21 ENCOUNTER — Emergency Department (HOSPITAL_BASED_OUTPATIENT_CLINIC_OR_DEPARTMENT_OTHER)
Admission: EM | Admit: 2018-05-21 | Discharge: 2018-05-21 | Discharge status: Absent without leave | Payer: Medicare Other

## 2019-10-14 ENCOUNTER — Ambulatory Visit: Payer: Medicare PPO | Attending: Internal Medicine

## 2019-10-14 DIAGNOSIS — Z23 Encounter for immunization: Secondary | ICD-10-CM | POA: Insufficient documentation

## 2019-10-14 NOTE — Progress Notes (Signed)
   Covid-19 Vaccination Clinic  Name:  Andre Stone    MRN: [id number] DOB: [birth year]  10/14/2019  [name] was observed post Covid-19 immunization for 15 minutes without incidence. He was provided with Vaccine Information Sheet and instruction to access the V-Safe system.   [name] was instructed to call 911 with any severe reactions post vaccine: Marland Kitchen Difficulty breathing  . Swelling of your face and throat  . A fast heartbeat  . A bad rash all over your body  . Dizziness and weakness    Immunizations Administered    Name Date Dose VIS Date Route   Pfizer COVID-19 Vaccine 10/14/2019  1:50 PM 0.3 mL 09/03/2019 Intramuscular   Manufacturer: [employer]   Lot: [id number]   [place]: [id number]

## 2019-10-27 ENCOUNTER — Emergency Department: Payer: Medicare PPO

## 2019-10-27 ENCOUNTER — Emergency Department
Admission: EM | Admit: 2019-10-27 | Discharge: 2019-10-27 | Disposition: A | Discharge status: Home / Self Care | Payer: Medicare PPO | Attending: Student in an Organized Health Care Education/Training Program | Admitting: Student in an Organized Health Care Education/Training Program

## 2019-10-27 ENCOUNTER — Encounter: Payer: Self-pay | Admitting: Intensive Care

## 2019-10-27 ENCOUNTER — Other Ambulatory Visit: Payer: Self-pay

## 2019-10-27 DIAGNOSIS — Z79899 Other long term (current) drug therapy: Secondary | ICD-10-CM | POA: Insufficient documentation

## 2019-10-27 DIAGNOSIS — M549 Dorsalgia, unspecified: Secondary | ICD-10-CM | POA: Diagnosis present

## 2019-10-27 DIAGNOSIS — Z7982 Long term (current) use of aspirin: Secondary | ICD-10-CM | POA: Insufficient documentation

## 2019-10-27 DIAGNOSIS — N200 Calculus of kidney: Secondary | ICD-10-CM | POA: Insufficient documentation

## 2019-10-27 DIAGNOSIS — R110 Nausea: Secondary | ICD-10-CM | POA: Diagnosis not present

## 2019-10-27 DIAGNOSIS — R109 Unspecified abdominal pain: Secondary | ICD-10-CM

## 2019-10-27 DIAGNOSIS — I10 Essential (primary) hypertension: Secondary | ICD-10-CM | POA: Diagnosis not present

## 2019-10-27 LAB — COMPREHENSIVE METABOLIC PANEL
ALT: 25 U/L (ref 0–44)
AST: 28 U/L (ref 15–41)
Albumin: 4.7 g/dL (ref 3.5–5.0)
Alkaline Phosphatase: 51 U/L (ref 38–126)
Anion gap: 13 (ref 5–15)
BUN: 31 mg/dL — ABNORMAL HIGH (ref 8–23)
CO2: 26 mmol/L (ref 22–32)
Calcium: 9.6 mg/dL (ref 8.9–10.3)
Chloride: 99 mmol/L (ref 98–111)
Creatinine, Ser: 1.51 mg/dL — ABNORMAL HIGH (ref 0.61–1.24)
GFR calc Af Amer: 52 mL/min — ABNORMAL LOW (ref >60)
GFR calc non Af Amer: 45 mL/min — ABNORMAL LOW (ref >60)
Glucose, Bld: 133 mg/dL — ABNORMAL HIGH (ref 70–99)
Potassium: 4 mmol/L (ref 3.5–5.1)
Sodium: 138 mmol/L (ref 135–145)
Total Bilirubin: 1.1 mg/dL (ref 0.3–1.2)
Total Protein: 8 g/dL (ref 6.5–8.1)

## 2019-10-27 LAB — URINALYSIS, COMPLETE (UACMP) WITH MICROSCOPIC
Bacteria, UA: NONE SEEN
Bilirubin Urine: NEGATIVE
Glucose, UA: NEGATIVE mg/dL
Ketones, ur: NEGATIVE mg/dL
Leukocytes,Ua: NEGATIVE
Nitrite: NEGATIVE
Protein, ur: 30 mg/dL — AB
RBC / HPF: >50 RBC/hpf — ABNORMAL HIGH (ref 0–5)
Specific Gravity, Urine: 1.019 (ref 1.005–1.030)
Squamous Epithelial / LPF: NONE SEEN (ref 0–5)
pH: 5 (ref 5.0–8.0)

## 2019-10-27 LAB — CBC
HCT: 42.4 % (ref 39.0–52.0)
Hemoglobin: 14.5 g/dL (ref 13.0–17.0)
MCH: 29.5 pg (ref 26.0–34.0)
MCHC: 34.2 g/dL (ref 30.0–36.0)
MCV: 86.4 fL (ref 80.0–100.0)
Platelets: 195 10*3/uL (ref 150–400)
RBC: 4.91 MIL/uL (ref 4.22–5.81)
RDW: 13.2 % (ref 11.5–15.5)
WBC: 6.7 10*3/uL (ref 4.0–10.5)
nRBC: 0 % (ref 0.0–0.2)

## 2019-10-27 LAB — LIPASE, BLOOD: Lipase: 49 U/L (ref 11–51)

## 2019-10-27 MED ORDER — OXYCODONE HCL 5 MG PO TABS: 5.0000 mg | ORAL_TABLET | Freq: Three times a day (TID) | ORAL | 0 refills | Status: DC | PRN

## 2019-10-27 MED ORDER — ONDANSETRON 4 MG PO TBDP: 4.0000 mg | ORAL_TABLET | Freq: Three times a day (TID) | ORAL | 0 refills | Status: DC | PRN

## 2019-10-27 MED ORDER — TAMSULOSIN HCL 0.4 MG PO CAPS: 0.4000 mg | ORAL_CAPSULE | Freq: Every day | ORAL | 0 refills | Status: DC

## 2019-10-27 MED ORDER — ONDANSETRON HCL 4 MG/2ML IJ SOLN
4.0000 mg | Freq: Once | INTRAMUSCULAR | Status: AC | PRN
  Administered 2019-10-27: 4 mg via INTRAVENOUS
  Filled 2019-10-27: qty 2

## 2019-10-27 MED ORDER — FENTANYL CITRATE (PF) 100 MCG/2ML IJ SOLN
50.0000 ug | INTRAMUSCULAR | Status: DC | PRN
  Administered 2019-10-27: 50 ug via INTRAVENOUS
  Filled 2019-10-27: qty 2

## 2019-10-27 MED ORDER — POLYETHYLENE GLYCOL 3350 17 G PO PACK: 17.0000 g | PACK | Freq: Every day | ORAL | 0 refills | Status: DC

## 2019-10-27 MED ORDER — KETOROLAC TROMETHAMINE 30 MG/ML IJ SOLN
30.0000 mg | Freq: Once | INTRAMUSCULAR | Status: AC
  Administered 2019-10-27: 30 mg via INTRAVENOUS
  Filled 2019-10-27: qty 1

## 2019-10-27 NOTE — ED Notes (Signed)
Pt continues to appear uncomfortable, continues with emesis while in subwait.  Pt presentation discussed with EDP, Kinner; see new orders.

## 2019-10-27 NOTE — Discharge Instructions (Addendum)
You have been seen in the emergency department for emergency care. It is important that you contact your own doctor, specialist or the closest clinic for follow-up care. Please bring this instruction sheet, all medications and X-ray copies with you when you are seen for follow-up care.  Determining the exact cause for all patients with abdominal pain is extremely difficult in the emergency department. Our primary focus is to rule-out immediate life-threatening diseases. If no immediate source of pain is found the definitive diagnosis frequently needs to be determined over time.Many times your primary care physician can determine the cause by following the symptoms over time. Sometimes, specialist are required such as Gastroenterologists, Gynecologists, Urologists or Surgeons. Please return immediately to the Emergency Department for fever>101, Vomiting or Intractable Pain. You should return to the emergency department or see your primary care provider in 12-24hrs if your pain is no better and sooner if your pain becomes worse.   IMPRESSION: 1. Moderate right-sided hydroureteronephrosis secondary to a proximal right ureteric 6 mm calculus. Smaller nonobstructive right nephrolithiasis. 2. Coronary artery atherosclerosis. Aortic Atherosclerosis (ICD10-I70.0). 3.  Tiny hiatal hernia. 4. Upper pole right renal lesion with a thin septation within, favored to represent a minimally complex cyst. 5.  Possible constipation.

## 2019-10-27 NOTE — ED Triage Notes (Addendum)
Patient c/o abdominal pain and left flank pain with N/V that started around noon today. Reports pain is severe. Has had gallbladder removed. HX kidney stones

## 2019-10-27 NOTE — ED Provider Notes (Signed)
[facility] Emergency Department Provider Note    First MD Initiated Contact with Patient 10/27/19 1710     (approximate)  I have reviewed the triage vital signs and the nursing notes.   HISTORY  Chief Complaint Abdominal Pain    HPI Andre Stone is a 74 y.o. male the below listed past medical history presents to the ER for severe back pain and flank pain started around noon today.  Felt little bit of discomfort yesterday but has subsided.  Did notice some darker colored urine today.  No fevers.  Did have associated nausea.  States the pain was severe but patient drove himself to the ER.  Has had kidney stones in the past and states it feels similar.    Past Medical History:  Diagnosis Date  . 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 Community acquired pneumonia 12/25/2015  . Essential (primary) hypertension 12/28/2015  . Gastro-esophageal reflux disease without esophagitis 12/28/2015  . History of kidney stones   . Hypertension    Family History  Problem Relation Age of Onset  . Stroke Mother   . AAA (abdominal aortic aneurysm) Father   . Hypertension Father   . Bladder Cancer Neg Hx   . Kidney cancer Neg Hx   . Prostate cancer Neg Hx    Past Surgical History:  Procedure Laterality Date  . CHOLECYSTECTOMY    . CYSTOSCOPY/URETEROSCOPY/HOLMIUM LASER/STENT PLACEMENT Right 12/29/2015   Procedure: CYSTOSCOPY/URETEROSCOPY/ with basketing of stone, right ureteral /STENT PLACEMENT;  Surgeon: [name], MD;  Location: [facility] ORS;  Service: Urology;  Laterality: Right;  . TONSILLECTOMY     Patient Active Problem List   Diagnosis Date Noted  . Intractable pain 01/02/2016  . Essential (primary) hypertension 12/28/2015  . Gastro-esophageal reflux disease without esophagitis 12/28/2015  . Cannot sleep 12/28/2015  . 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 Kidney stone 12/25/2015  . Community acquired pneumonia 12/25/2015  . Pneumonia  12/25/2015  . 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 Authorizing Provider  aspirin EC 81 MG tablet Take 81 mg by mouth daily.    [provider]  cholecalciferol (VITAMIN D) 1000 units tablet Take 2,000 Units by mouth daily.    [provider]  docusate sodium (COLACE) 100 MG capsule Take 100 mg by mouth 2 (two) times daily as needed for mild constipation.    [provider]  esomeprazole (NEXIUM) 20 MG capsule Take 20 mg by mouth daily at 12 noon.    [provider]  hydrochlorothiazide (HYDRODIURIL) 25 MG tablet Take 25 mg by mouth daily.    [provider]  lisinopril (PRINIVIL,ZESTRIL) 40 MG tablet Take 40 mg by mouth daily.    [provider]  Melatonin 3 MG TABS Take 3 mg by mouth at bedtime as needed (sleep).    [provider]  Multiple Vitamins-Minerals (MULTIVITAMIN WITH MINERALS) tablet Take 1 tablet by mouth daily.    [provider]  Omega-3 Fatty Acids (FISH OIL) 1000 MG CAPS Take 1,000 mg by mouth daily.    [provider]  ondansetron (ZOFRAN ODT) 4 MG disintegrating tablet Take 1 tablet (4 mg total) by mouth every 8 (eight) hours as needed for nausea or vomiting. 10/27/19   Merlyn Lot, MD  oxyCODONE (OXY IR/ROXICODONE) 5 MG immediate release tablet Take 1 tablet (5 mg total) by mouth every 6 (six) hours  as needed for moderate pain, severe pain or breakthrough pain. 12/26/15   Loletha Grayer, MD  oxyCODONE (ROXICODONE) 5 MG immediate release tablet Take 1 tablet (5 mg total) by mouth every 8 (eight) hours as needed. 10/27/19 10/26/20  Merlyn Lot, MD  polyethylene glycol (MIRALAX / GLYCOLAX) 17 g packet Take 17 g by mouth daily. Mix one tablespoon with 8oz of your favorite juice or water every day until you are having soft formed stools. Then start taking once daily if you didn't have a stool the day before.  10/27/19   Merlyn Lot, MD  promethazine (PHENERGAN) 25 MG tablet Take 25 mg by mouth every 6 (six) hours as needed for nausea or vomiting.    [provider]  tamsulosin (FLOMAX) 0.4 MG CAPS capsule Take 1 capsule (0.4 mg total) by mouth daily after supper. 12/26/15   Loletha Grayer, MD  tamsulosin (FLOMAX) 0.4 MG CAPS capsule Take 1 capsule (0.4 mg total) by mouth daily after supper. 10/27/19   Merlyn Lot, MD  zolpidem (AMBIEN) 5 MG tablet Take 2.5-5 mg by mouth at bedtime as needed for sleep.     [provider]    Allergies Penicillins    Social History Social History   Tobacco Use  . Smoking status: Never Smoker  . Smokeless tobacco: Never Used  Substance Use Topics  . Alcohol use: Yes    Comment: regular, several times per week, liquor  . Drug use: No    Review of Systems Patient denies headaches, rhinorrhea, blurry vision, numbness, shortness of breath, chest pain, edema, cough, abdominal pain, nausea, vomiting, diarrhea, dysuria, fevers, rashes or hallucinations unless otherwise stated above in HPI. ____________________________________________   PHYSICAL EXAM:  VITAL SIGNS: Vitals:   10/27/19 1430  BP: (!) 167/110  Pulse: 73  Resp: 18  Temp: 98.2 F (36.8 C)  SpO2: 98%    Constitutional: Alert and oriented.  Eyes: Conjunctivae are normal.  Head: Atraumatic. Nose: No congestion/rhinnorhea. Mouth/Throat: Mucous membranes are moist.   Neck: No stridor. Painless ROM.  Cardiovascular: Normal rate, regular rhythm. Grossly normal heart sounds.  Good peripheral circulation. Respiratory: Normal respiratory effort.  No retractions. Lungs CTAB. Gastrointestinal: Soft and nontender. No distention. No abdominal bruits. No CVA tenderness. Genitourinary:  Musculoskeletal: No lower extremity tenderness nor edema.  No joint effusions. Neurologic:  Normal speech and language. No gross focal neurologic deficits are appreciated. No facial  droop Skin:  Skin is warm, dry and intact. No rash noted. Psychiatric: Mood and affect are normal. Speech and behavior are normal.  ____________________________________________   LABS (all labs ordered are listed, but only abnormal results are displayed)  Results for orders placed or performed during the hospital encounter of 10/27/19 (from the past 24 hour(s))  Lipase, blood     Status: None   Collection Time: 10/27/19  2:38 PM  Result Value Ref Range   Lipase 49 11 - 51 U/L  Comprehensive metabolic panel     Status: Abnormal   Collection Time: 10/27/19  2:38 PM  Result Value Ref Range   Sodium 138 135 - 145 mmol/L   Potassium 4.0 3.5 - 5.1 mmol/L   Chloride 99 98 - 111 mmol/L   CO2 26 22 - 32 mmol/L   Glucose, Bld 133 (H) 70 - 99 mg/dL   BUN 31 (H) 8 - 23 mg/dL   Creatinine, Ser 1.51 (H) 0.61 - 1.24 mg/dL   Calcium 9.6 8.9 - 10.3 mg/dL   Total Protein 8.0 6.5 - 8.1  g/dL   Albumin 4.7 3.5 - 5.0 g/dL   AST 28 15 - 41 U/L   ALT 25 0 - 44 U/L   Alkaline Phosphatase 51 38 - 126 U/L   Total Bilirubin 1.1 0.3 - 1.2 mg/dL   GFR calc non Af Amer 45 (L) >60 mL/min   GFR calc Af Amer 52 (L) >60 mL/min   Anion gap 13 5 - 15  CBC     Status: None   Collection Time: 10/27/19  2:38 PM  Result Value Ref Range   WBC 6.7 4.0 - 10.5 K/uL   RBC 4.91 4.22 - 5.81 MIL/uL   Hemoglobin 14.5 13.0 - 17.0 g/dL   HCT 42.4 39.0 - 52.0 %   MCV 86.4 80.0 - 100.0 fL   MCH 29.5 26.0 - 34.0 pg   MCHC 34.2 30.0 - 36.0 g/dL   RDW 13.2 11.5 - 15.5 %   Platelets 195 150 - 400 K/uL   nRBC 0.0 0.0 - 0.2 %  Urinalysis, Complete w Microscopic     Status: Abnormal   Collection Time: 10/27/19  5:36 PM  Result Value Ref Range   Color, Urine YELLOW (A) YELLOW   APPearance CLOUDY (A) CLEAR   Specific Gravity, Urine 1.019 1.005 - 1.030   pH 5.0 5.0 - 8.0   Glucose, UA NEGATIVE NEGATIVE mg/dL   Hgb urine dipstick LARGE (A) NEGATIVE   Bilirubin Urine NEGATIVE NEGATIVE   Ketones, ur NEGATIVE NEGATIVE mg/dL    Protein, ur 30 (A) NEGATIVE mg/dL   Nitrite NEGATIVE NEGATIVE   Leukocytes,Ua NEGATIVE NEGATIVE   RBC / HPF >50 (H) 0 - 5 RBC/hpf   WBC, UA 11-20 0 - 5 WBC/hpf   Bacteria, UA NONE SEEN NONE SEEN   Squamous Epithelial / LPF NONE SEEN 0 - 5   Mucus PRESENT    ____________________________________________ ____________________________________________  RADIOLOGY  I personally reviewed all radiographic images ordered to evaluate for the above acute complaints and reviewed radiology reports and findings.  These findings were personally discussed with the patient.  Please see medical record for radiology report.  ____________________________________________   PROCEDURES  Procedure(s) performed:  Procedures    Critical Care performed: no ____________________________________________   INITIAL IMPRESSION / ASSESSMENT AND PLAN / ED COURSE  Pertinent labs & imaging results that were available during my care of the patient were reviewed by me and considered in my medical decision making (see chart for details).   DDX: stone, pyelo, AAA, msk strain, colitis, colic  Andre Stone is a 74 y.o. who presents to the ED with symptoms as described above.  Patient currently well-appearing after receiving Toradol in triage.  Does have evidence of 6 mm ureteral stone.  Nontoxic-appearing at this time.  Will check urine.  Declines any additional pain meds at this time.   Urinalysis does not show any evidence of infection.  He is currently pain-free.  Believe he is appropriate and cleared for outpatient follow-up.  Discussed signs symptoms which he should return to the ER.  The patient was evaluated in Emergency Department today for the symptoms described in the history of present illness. He/she was evaluated in the context of the global COVID-19 pandemic, which necessitated consideration that the patient might be at risk for infection with the SARS-CoV-2 virus that causes COVID-19. Institutional  protocols and algorithms that pertain to the evaluation of patients at risk for COVID-19 are in a state of rapid change based on information released by regulatory bodies including  the [employer] and federal and state organizations. These policies and algorithms were followed during the patient's care in the ED.  As part of my medical decision making, I reviewed the following data within the [facility] notes reviewed and incorporated, Labs reviewed, notes from prior ED visits and  Controlled Substance Database   ____________________________________________   FINAL CLINICAL IMPRESSION(S) / ED DIAGNOSES  Final diagnoses:  Acute right flank pain  Kidney stone      NEW MEDICATIONS STARTED DURING THIS VISIT:  New Prescriptions   ONDANSETRON (ZOFRAN ODT) 4 MG DISINTEGRATING TABLET    Take 1 tablet (4 mg total) by mouth every 8 (eight) hours as needed for nausea or vomiting.   OXYCODONE (ROXICODONE) 5 MG IMMEDIATE RELEASE TABLET    Take 1 tablet (5 mg total) by mouth every 8 (eight) hours as needed.   POLYETHYLENE GLYCOL (MIRALAX / GLYCOLAX) 17 G PACKET    Take 17 g by mouth daily. Mix one tablespoon with 8oz of your favorite juice or water every day until you are having soft formed stools. Then start taking once daily if you didn't have a stool the day before.   TAMSULOSIN (FLOMAX) 0.4 MG CAPS CAPSULE    Take 1 capsule (0.4 mg total) by mouth daily after supper.     Note:  This document was prepared using Dragon voice recognition software and may include unintentional dictation errors.    Merlyn Lot, MD 10/27/19 [phone]

## 2019-11-02 ENCOUNTER — Emergency Department: Payer: Medicare PPO

## 2019-11-02 ENCOUNTER — Encounter
Admission: RE | Disposition: A | Discharge status: Home / Self Care | Payer: Self-pay | Source: Home / Self Care | Attending: Emergency Medicine

## 2019-11-02 ENCOUNTER — Emergency Department: Payer: Medicare PPO | Admitting: Registered Nurse

## 2019-11-02 ENCOUNTER — Encounter: Payer: Self-pay | Admitting: Intensive Care

## 2019-11-02 ENCOUNTER — Ambulatory Visit: Payer: BC Managed Care – PPO | Admitting: Urology

## 2019-11-02 ENCOUNTER — Other Ambulatory Visit: Payer: Self-pay

## 2019-11-02 ENCOUNTER — Ambulatory Visit
Admission: RE | Admit: 2019-11-02 | Discharge: 2019-11-02 | Disposition: A | Discharge status: Home / Self Care | Payer: Medicare PPO | Attending: Emergency Medicine | Admitting: Emergency Medicine

## 2019-11-02 DIAGNOSIS — N132 Hydronephrosis with renal and ureteral calculous obstruction: Secondary | ICD-10-CM | POA: Insufficient documentation

## 2019-11-02 DIAGNOSIS — Z20822 Contact with and (suspected) exposure to covid-19: Secondary | ICD-10-CM | POA: Diagnosis not present

## 2019-11-02 DIAGNOSIS — Z7982 Long term (current) use of aspirin: Secondary | ICD-10-CM | POA: Insufficient documentation

## 2019-11-02 DIAGNOSIS — C670 Malignant neoplasm of trigone of bladder: Secondary | ICD-10-CM | POA: Diagnosis not present

## 2019-11-02 DIAGNOSIS — I10 Essential (primary) hypertension: Secondary | ICD-10-CM | POA: Diagnosis not present

## 2019-11-02 DIAGNOSIS — R7303 Prediabetes: Secondary | ICD-10-CM | POA: Insufficient documentation

## 2019-11-02 DIAGNOSIS — G4730 Sleep apnea, unspecified: Secondary | ICD-10-CM | POA: Diagnosis not present

## 2019-11-02 DIAGNOSIS — R109 Unspecified abdominal pain: Secondary | ICD-10-CM

## 2019-11-02 DIAGNOSIS — D494 Neoplasm of unspecified behavior of bladder: Secondary | ICD-10-CM | POA: Diagnosis not present

## 2019-11-02 DIAGNOSIS — N179 Acute kidney failure, unspecified: Secondary | ICD-10-CM | POA: Diagnosis present

## 2019-11-02 DIAGNOSIS — N200 Calculus of kidney: Secondary | ICD-10-CM | POA: Diagnosis present

## 2019-11-02 DIAGNOSIS — K219 Gastro-esophageal reflux disease without esophagitis: Secondary | ICD-10-CM | POA: Diagnosis not present

## 2019-11-02 DIAGNOSIS — N23 Unspecified renal colic: Secondary | ICD-10-CM

## 2019-11-02 DIAGNOSIS — N201 Calculus of ureter: Secondary | ICD-10-CM | POA: Diagnosis not present

## 2019-11-02 DIAGNOSIS — Z79899 Other long term (current) drug therapy: Secondary | ICD-10-CM | POA: Diagnosis not present

## 2019-11-02 DIAGNOSIS — Z880 Allergy status to penicillin: Secondary | ICD-10-CM | POA: Insufficient documentation

## 2019-11-02 HISTORY — PX: CYSTOSCOPY WITH BIOPSY: SHX5122

## 2019-11-02 HISTORY — PX: CYSTOSCOPY/URETEROSCOPY/HOLMIUM LASER/STENT PLACEMENT: SHX6546

## 2019-11-02 HISTORY — PX: CYSTOSCOPY W/ RETROGRADES: SHX1426

## 2019-11-02 LAB — BASIC METABOLIC PANEL
Anion gap: 14 (ref 5–15)
BUN: 36 mg/dL — ABNORMAL HIGH (ref 8–23)
CO2: 26 mmol/L (ref 22–32)
Calcium: 9.2 mg/dL (ref 8.9–10.3)
Chloride: 93 mmol/L — ABNORMAL LOW (ref 98–111)
Creatinine, Ser: 2.29 mg/dL — ABNORMAL HIGH (ref 0.61–1.24)
GFR calc Af Amer: 32 mL/min — ABNORMAL LOW (ref >60)
GFR calc non Af Amer: 27 mL/min — ABNORMAL LOW (ref >60)
Glucose, Bld: 132 mg/dL — ABNORMAL HIGH (ref 70–99)
Potassium: 4.3 mmol/L (ref 3.5–5.1)
Sodium: 133 mmol/L — ABNORMAL LOW (ref 135–145)

## 2019-11-02 LAB — URINALYSIS, COMPLETE (UACMP) WITH MICROSCOPIC
Bacteria, UA: NONE SEEN
Bilirubin Urine: NEGATIVE
Glucose, UA: NEGATIVE mg/dL
Ketones, ur: 5 mg/dL — AB
Leukocytes,Ua: NEGATIVE
Nitrite: NEGATIVE
Protein, ur: 30 mg/dL — AB
Specific Gravity, Urine: 1.012 (ref 1.005–1.030)
pH: 5 (ref 5.0–8.0)

## 2019-11-02 LAB — CBC
HCT: 39.6 % (ref 39.0–52.0)
Hemoglobin: 13.6 g/dL (ref 13.0–17.0)
MCH: 29.8 pg (ref 26.0–34.0)
MCHC: 34.3 g/dL (ref 30.0–36.0)
MCV: 86.7 fL (ref 80.0–100.0)
Platelets: 205 10*3/uL (ref 150–400)
RBC: 4.57 MIL/uL (ref 4.22–5.81)
RDW: 12.7 % (ref 11.5–15.5)
WBC: 9.7 10*3/uL (ref 4.0–10.5)
nRBC: 0 % (ref 0.0–0.2)

## 2019-11-02 LAB — RESPIRATORY PANEL BY RT PCR (FLU A&B, COVID)
Influenza A by PCR: NEGATIVE
Influenza B by PCR: NEGATIVE
SARS Coronavirus 2 by RT PCR: NEGATIVE

## 2019-11-02 SURGERY — CYSTOSCOPY/URETEROSCOPY/HOLMIUM LASER/STENT PLACEMENT
Anesthesia: General | Site: Ureter | Laterality: Right

## 2019-11-02 MED ORDER — ONDANSETRON HCL 4 MG/2ML IJ SOLN
4.0000 mg | Freq: Once | INTRAMUSCULAR | Status: AC
  Administered 2019-11-02: 4 mg via INTRAVENOUS
  Filled 2019-11-02: qty 2

## 2019-11-02 MED ORDER — MORPHINE SULFATE (PF) 4 MG/ML IV SOLN
INTRAVENOUS | Status: AC
  Filled 2019-11-02: qty 1

## 2019-11-02 MED ORDER — OXYBUTYNIN CHLORIDE 5 MG PO TABS: ORAL_TABLET | ORAL | 0 refills | Status: DC

## 2019-11-02 MED ORDER — SUCCINYLCHOLINE CHLORIDE 20 MG/ML IJ SOLN
INTRAMUSCULAR | Status: AC
  Filled 2019-11-02: qty 1

## 2019-11-02 MED ORDER — CEFTRIAXONE SODIUM 1 G IJ SOLR
INTRAMUSCULAR | Status: AC
  Filled 2019-11-02: qty 10

## 2019-11-02 MED ORDER — IOHEXOL 180 MG/ML  SOLN
INTRAMUSCULAR | Status: DC | PRN
  Administered 2019-11-02: 13 mL

## 2019-11-02 MED ORDER — FENTANYL CITRATE (PF) 100 MCG/2ML IJ SOLN: 25.0000 ug | INTRAMUSCULAR | Status: DC | PRN

## 2019-11-02 MED ORDER — ONDANSETRON HCL 4 MG/2ML IJ SOLN
INTRAMUSCULAR | Status: AC
  Filled 2019-11-02: qty 2

## 2019-11-02 MED ORDER — ONDANSETRON HCL 4 MG/2ML IJ SOLN: 4.0000 mg | Freq: Once | INTRAMUSCULAR | Status: DC | PRN

## 2019-11-02 MED ORDER — ACETAMINOPHEN 10 MG/ML IV SOLN
INTRAVENOUS | Status: AC
  Filled 2019-11-02: qty 100

## 2019-11-02 MED ORDER — LACTATED RINGERS IV BOLUS
1000.0000 mL | Freq: Once | INTRAVENOUS | Status: AC
  Administered 2019-11-02: 1000 mL via INTRAVENOUS

## 2019-11-02 MED ORDER — EPHEDRINE SULFATE 50 MG/ML IJ SOLN
INTRAMUSCULAR | Status: AC
  Filled 2019-11-02: qty 1

## 2019-11-02 MED ORDER — LIDOCAINE HCL (PF) 2 % IJ SOLN
INTRAMUSCULAR | Status: AC
  Filled 2019-11-02: qty 5

## 2019-11-02 MED ORDER — MORPHINE SULFATE (PF) 4 MG/ML IV SOLN
4.0000 mg | Freq: Once | INTRAVENOUS | Status: AC
  Administered 2019-11-02: 12:00:00 4 mg via INTRAVENOUS

## 2019-11-02 MED ORDER — ROCURONIUM BROMIDE 50 MG/5ML IV SOLN
INTRAVENOUS | Status: AC
  Filled 2019-11-02: qty 1

## 2019-11-02 MED ORDER — PHENYLEPHRINE HCL (PRESSORS) 10 MG/ML IV SOLN
INTRAVENOUS | Status: DC | PRN
  Administered 2019-11-02: 100 ug via INTRAVENOUS

## 2019-11-02 MED ORDER — FENTANYL CITRATE (PF) 100 MCG/2ML IJ SOLN
INTRAMUSCULAR | Status: DC | PRN
  Administered 2019-11-02 (×4): 25 ug via INTRAVENOUS

## 2019-11-02 MED ORDER — ACETAMINOPHEN 10 MG/ML IV SOLN
INTRAVENOUS | Status: DC | PRN
  Administered 2019-11-02: 1000 mg via INTRAVENOUS

## 2019-11-02 MED ORDER — FENTANYL CITRATE (PF) 100 MCG/2ML IJ SOLN
INTRAMUSCULAR | Status: AC
  Filled 2019-11-02: qty 2

## 2019-11-02 MED ORDER — ONDANSETRON HCL 4 MG/2ML IJ SOLN
INTRAMUSCULAR | Status: DC | PRN
  Administered 2019-11-02: 4 mg via INTRAVENOUS

## 2019-11-02 MED ORDER — GLYCOPYRROLATE 0.2 MG/ML IJ SOLN
INTRAMUSCULAR | Status: AC
  Filled 2019-11-02: qty 1

## 2019-11-02 MED ORDER — PROPOFOL 10 MG/ML IV BOLUS
INTRAVENOUS | Status: DC | PRN
  Administered 2019-11-02: 180 mg via INTRAVENOUS

## 2019-11-02 MED ORDER — LIDOCAINE HCL (CARDIAC) PF 100 MG/5ML IV SOSY
PREFILLED_SYRINGE | INTRAVENOUS | Status: DC | PRN
  Administered 2019-11-02: 100 mg via INTRAVENOUS

## 2019-11-02 MED ORDER — LACTATED RINGERS IV SOLN: INTRAVENOUS | Status: DC | PRN

## 2019-11-02 MED ORDER — DEXAMETHASONE SODIUM PHOSPHATE 10 MG/ML IJ SOLN
INTRAMUSCULAR | Status: AC
  Filled 2019-11-02: qty 1

## 2019-11-02 MED ORDER — PROPOFOL 10 MG/ML IV BOLUS
INTRAVENOUS | Status: AC
  Filled 2019-11-02: qty 20

## 2019-11-02 MED ORDER — SODIUM CHLORIDE 0.9 % IV SOLN
1.0000 g | INTRAVENOUS | Status: AC
  Administered 2019-11-02: 16:00:00 1 g via INTRAVENOUS

## 2019-11-02 MED ORDER — DEXAMETHASONE SODIUM PHOSPHATE 10 MG/ML IJ SOLN
INTRAMUSCULAR | Status: DC | PRN
  Administered 2019-11-02: 10 mg via INTRAVENOUS

## 2019-11-02 SURGICAL SUPPLY — 30 items
BAG DRAIN CYSTO-URO LG1000N (MISCELLANEOUS) ×3 IMPLANT
BASKET LASER NITINOL 1.9FR (BASKET) ×3 IMPLANT
BASKET ZERO TIP 1.9FR (BASKET) ×3 IMPLANT
BRUSH SCRUB EZ 1% IODOPHOR (MISCELLANEOUS) ×3 IMPLANT
CATH URETL 5X70 OPEN END (CATHETERS) IMPLANT
CNTNR SPEC 2.5X3XGRAD LEK (MISCELLANEOUS)
CONT SPEC 4OZ STER OR WHT (MISCELLANEOUS)
CONTAINER SPEC 2.5X3XGRAD LEK (MISCELLANEOUS) IMPLANT
DRAPE UTILITY 15X26 TOWEL STRL (DRAPES) ×3 IMPLANT
FIBER LASER TRAC TIP (UROLOGICAL SUPPLIES) ×3 IMPLANT
GLOVE BIO SURGEON STRL SZ8 (GLOVE) ×3 IMPLANT
GOWN STRL REUS W/ TWL LRG LVL3 (GOWN DISPOSABLE) ×2 IMPLANT
GOWN STRL REUS W/ TWL XL LVL3 (GOWN DISPOSABLE) ×2 IMPLANT
GOWN STRL REUS W/TWL LRG LVL3 (GOWN DISPOSABLE) ×1
GOWN STRL REUS W/TWL XL LVL3 (GOWN DISPOSABLE) ×1
GRASPER TRICEP (INSTRUMENTS) ×3 IMPLANT
GUIDEWIRE STR DUAL SENSOR (WIRE) ×3 IMPLANT
INFUSOR MANOMETER BAG 3000ML (MISCELLANEOUS) ×3 IMPLANT
INTRODUCER DILATOR DOUBLE (INTRODUCER) IMPLANT
KIT TURNOVER CYSTO (KITS) ×3 IMPLANT
PACK CYSTO AR (MISCELLANEOUS) ×3 IMPLANT
SET CYSTO W/LG BORE CLAMP LF (SET/KITS/TRAYS/PACK) ×3 IMPLANT
SHEATH URETERAL 12FRX35CM (MISCELLANEOUS) IMPLANT
SOL .9 NS 3000ML IRR  AL (IV SOLUTION) ×1
SOL .9 NS 3000ML IRR UROMATIC (IV SOLUTION) ×2 IMPLANT
STENT URET 6FRX24 CONTOUR (STENTS) IMPLANT
STENT URET 6FRX26 CONTOUR (STENTS) ×3 IMPLANT
SURGILUBE 2OZ TUBE FLIPTOP (MISCELLANEOUS) ×3 IMPLANT
VALVE UROSEAL ADJ ENDO (VALVE) IMPLANT
WATER STERILE IRR 1000ML POUR (IV SOLUTION) ×3 IMPLANT

## 2019-11-02 NOTE — ED Provider Notes (Signed)
[facility] Emergency Department Provider Note   ____________________________________________   First MD Initiated Contact with Patient 11/02/19 1148     (approximate)  I have reviewed the triage vital signs and the nursing notes.   HISTORY  Chief Complaint Nephrolithiasis    HPI GOLDMAN CULLOM is a 74 y.o. male with past medical history of hypertension, GERD, and nephrolithiasis who presents to the ED complaining of flank and abdominal pain.  Patient reports that he was initially diagnosed with a right-sided kidney stone 6 days ago based off of CT here in the ED.  He states that he was initially doing well with minimal pain, but he developed severe pain in his right flank radiating towards his mid abdomen earlier today.  He took the oxycodone he was prescribed without any relief and also developed some nausea with one episode of vomiting.  He denies any fevers, dysuria, or hematuria.        Past Medical History:  Diagnosis Date  . 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 Community acquired pneumonia 12/25/2015  . Essential (primary) hypertension 12/28/2015  . Gastro-esophageal reflux disease without esophagitis 12/28/2015  . History of kidney stones   . Hypertension     Patient Active Problem List   Diagnosis Date Noted  . Intractable pain 01/02/2016  . Essential (primary) hypertension 12/28/2015  . Gastro-esophageal reflux disease without esophagitis 12/28/2015  . Cannot sleep 12/28/2015  . 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 Kidney stone 12/25/2015  . Community acquired pneumonia 12/25/2015  . Pneumonia 12/25/2015  . 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 CHOLECYSTECTOMY    . CYSTOSCOPY/URETEROSCOPY/HOLMIUM LASER/STENT PLACEMENT Right 12/29/2015   Procedure: CYSTOSCOPY/URETEROSCOPY/ with basketing of stone, right ureteral /STENT PLACEMENT;  Surgeon: [name], MD;   Location: [facility] ORS;  Service: Urology;  Laterality: Right;  . TONSILLECTOMY      Prior to Admission medications   Medication Sig Start Date End Date Taking? Authorizing Provider  aspirin EC 81 MG tablet Take 81 mg by mouth daily.    [provider]  cholecalciferol (VITAMIN D) 1000 units tablet Take 2,000 Units by mouth daily.    [provider]  docusate sodium (COLACE) 100 MG capsule Take 100 mg by mouth 2 (two) times daily as needed for mild constipation.    [provider]  esomeprazole (NEXIUM) 20 MG capsule Take 20 mg by mouth daily at 12 noon.    [provider]  hydrochlorothiazide (HYDRODIURIL) 25 MG tablet Take 25 mg by mouth daily.    [provider]  lisinopril (PRINIVIL,ZESTRIL) 40 MG tablet Take 40 mg by mouth daily.    [provider]  Melatonin 3 MG TABS Take 3 mg by mouth at bedtime as needed (sleep).    [provider]  Multiple Vitamins-Minerals (MULTIVITAMIN WITH MINERALS) tablet Take 1 tablet by mouth daily.    [provider]  Omega-3 Fatty Acids (FISH OIL) 1000 MG CAPS Take 1,000 mg by mouth daily.    [provider]  ondansetron (ZOFRAN ODT) 4 MG disintegrating tablet Take 1 tablet (4 mg total) by mouth every 8 (eight) hours as needed for nausea or vomiting. 10/27/19   Merlyn Lot, MD  oxyCODONE (OXY IR/ROXICODONE) 5 MG immediate release tablet Take 1 tablet (5 mg total) by mouth every 6 (six) hours as needed for moderate pain, severe pain or breakthrough pain. 12/26/15   Loletha Grayer, MD  oxyCODONE (ROXICODONE) 5 MG immediate release tablet Take 1 tablet (5 mg total) by mouth every 8 (eight) hours as needed. 10/27/19 10/26/20  Merlyn Lot, MD  polyethylene glycol (MIRALAX / GLYCOLAX) 17 g packet Take 17 g by mouth daily. Mix one tablespoon with 8oz of your favorite juice or water every day until you are having soft formed stools. Then start taking once daily if you didn't have a stool  the day before. 10/27/19   Merlyn Lot, MD  promethazine (PHENERGAN) 25 MG tablet Take 25 mg by mouth every 6 (six) hours as needed for nausea or vomiting.    [provider]  tamsulosin (FLOMAX) 0.4 MG CAPS capsule Take 1 capsule (0.4 mg total) by mouth daily after supper. 12/26/15   Loletha Grayer, MD  tamsulosin (FLOMAX) 0.4 MG CAPS capsule Take 1 capsule (0.4 mg total) by mouth daily after supper. 10/27/19   Merlyn Lot, MD  zolpidem (AMBIEN) 5 MG tablet Take 2.5-5 mg by mouth at bedtime as needed for sleep.     [provider]    Allergies Penicillins  Family History  Problem Relation Age of Onset  . Stroke Mother   . AAA (abdominal aortic aneurysm) Father   . Hypertension Father   . Bladder Cancer Neg Hx   . Kidney cancer Neg Hx   . Prostate cancer Neg Hx     Social History Social History   Tobacco Use  . Smoking status: Never Smoker  . Smokeless tobacco: Never Used  Substance Use Topics  . Alcohol use: Yes    Comment: regular, several times per week, liquor  . Drug use: No    Review of Systems  Constitutional: No fever/chills Eyes: No visual changes. ENT: No sore throat. Cardiovascular: Denies chest pain. Respiratory: Denies shortness of breath. Gastrointestinal: Positive for flank and abdominal pain.  Positive for nausea and vomiting.  No diarrhea.  No constipation. Genitourinary: Negative for dysuria. Musculoskeletal: Negative for back pain. Skin: Negative for rash. Neurological: Negative for headaches, focal weakness or numbness.  ____________________________________________   PHYSICAL EXAM:  VITAL SIGNS: ED Triage Vitals  Enc Vitals Group     BP 11/02/19 1121 (!) 185/96     Pulse Rate 11/02/19 1121 89     Resp 11/02/19 1121 18     Temp 11/02/19 1121 98 F (36.7 C)     Temp Source 11/02/19 1121 Oral     SpO2 11/02/19 1121 98 %     Weight 11/02/19 1122 191 lb (86.6 kg)     Height 11/02/19 1122 6' (1.829 m)     Head  Circumference --      Peak Flow --      Pain Score 11/02/19 1121 9     Pain Loc --      Pain Edu? --      Excl. in [place]? --     Constitutional: Alert and oriented. Eyes: Conjunctivae are normal. Head: Atraumatic. Nose: No congestion/rhinnorhea. Mouth/Throat: Mucous membranes are moist. Neck: Normal ROM Cardiovascular: Normal rate, regular rhythm. Grossly normal heart sounds. Respiratory: Normal respiratory effort.  No retractions. Lungs CTAB. Gastrointestinal: Soft and nontender. No distention. Genitourinary: deferred Musculoskeletal: No lower extremity tenderness nor edema. Neurologic:  Normal speech and language. No gross focal neurologic deficits are appreciated. Skin:  Skin is warm, dry and intact. No rash noted. Psychiatric: Mood and affect are normal. Speech and behavior are normal.  ____________________________________________   LABS (all labs ordered are listed, but only abnormal results are displayed)  Labs Reviewed  URINALYSIS, COMPLETE (UACMP) WITH MICROSCOPIC - Abnormal; Notable for the following components:      Result Value   Color, Urine YELLOW (*)    APPearance CLEAR (*)    Hgb urine dipstick SMALL (*)    Ketones, ur 5 (*)    Protein, ur 30 (*)    All other components within normal limits  BASIC METABOLIC PANEL - Abnormal; Notable for the following components:   Sodium 133 (*)    Chloride 93 (*)    Glucose, Bld 132 (*)    BUN 36 (*)    Creatinine, Ser 2.29 (*)    GFR calc non Af Amer 27 (*)    GFR calc Af Amer 32 (*)    All other components within normal limits  RESPIRATORY PANEL BY RT PCR (FLU A&B, COVID)  CBC     PROCEDURES  Procedure(s) performed (including Critical Care):  Procedures   ____________________________________________   INITIAL IMPRESSION / ASSESSMENT AND PLAN / ED COURSE       73 year old male presents to the ED for worsening right flank and abdominal pain, similar to what he experienced 6 days ago when he was diagnosed  with a kidney stone.  He received a dose of morphine and Zofran in triage, now has near resolution of symptoms.  KUB obtained, but was unable to locate the location of the stone due to overlying bowel gas.  Patient does report some constipation and I have advised him on regimen of Colace, MiraLAX, fiber supplements, and Dulcolax as needed.  No signs of infection in urine, renal function slightly worse than prior, will hydrate with IV fluids.  CT shows similar positioning of 8 mm proximal right ureteral stone.  Case discussed with Dr. Bernardo Heater of urology, who will plan on taking the patient to the OR today for ureteroscopy and stenting.  Patient to be discharged from the PACU after procedure and have advised him to follow-up with his PCP for recheck of kidney function.  Patient agrees with plan.      ____________________________________________   FINAL CLINICAL IMPRESSION(S) / ED DIAGNOSES  Final diagnoses:  Nephrolithiasis  Flank pain  AKI (acute kidney injury) [facility])     ED Discharge Orders    None       Note:  This document was prepared using Dragon voice recognition software and may include unintentional dictation errors.   Blake Divine, MD 11/02/19 [phone]

## 2019-11-02 NOTE — Anesthesia Procedure Notes (Signed)
Procedure Name: LMA Insertion Date/Time: 11/02/2019 4:17 PM Performed by: Doreen Salvage, CRNA Pre-anesthesia Checklist: Patient identified, Patient being monitored, Timeout performed, Emergency Drugs available and Suction available Patient Re-evaluated:Patient Re-evaluated prior to induction Oxygen Delivery Method: Circle system utilized Preoxygenation: Pre-oxygenation with 100% oxygen Induction Type: IV induction Ventilation: Mask ventilation without difficulty LMA: LMA inserted LMA Size: 3.5 Tube type: Oral Number of attempts: 1 Placement Confirmation: positive ETCO2 and breath sounds checked- equal and bilateral Tube secured with: Tape Dental Injury: Teeth and Oropharynx as per pre-operative assessment

## 2019-11-02 NOTE — ED Triage Notes (Signed)
Patient has current kidney stone. Was seen here on 10/27/19 and diagnosed. Reports pain is so severe he had to come back today. Urologist appointment was today at 1pm

## 2019-11-02 NOTE — Op Note (Signed)
Preoperative diagnosis:  1.  Right proximal ureteral calculus  Postoperative diagnosis:  1.  Right proximal ureteral calculus 2.  Bladder tumor  Procedure:  Cystoscopy Right ureteroscopy and stone removal Ureteroscopic laser lithotripsy Right ureteral stent placement (22F/26 cm)  Bladder biopsy/fulguration Right retrograde pyelography with interpretation  Surgeon: Nicki Reaper C. Emmilia Sowder, M.D.  Anesthesia: General  Complications: None  Intraoperative findings:  1.  Right retrograde pyelography post procedure showed no filling defects, stone fragments or contrast extravasation  2.  Right proximal ureteral calculus   3.  <5 mm papillary bladder tumor  EBL: Minimal  Specimens: Calculus fragments for analysis   Indication: Andre Stone is a 74 y.o. year old patient who initially presented to the ED on 10/27/2019 with right renal colic secondary to an 8 mm right proximal ureteral calculus.  He had continued intermittent pain and due to increasing pain and nausea was seen back in the ED today.  A CT was repeated which showed nonprogression of the stone.  He has a history of uric acid nephrolithiasis and a stone was not visualized on KUB.  After reviewing the management options for treatment, the patient elected to proceed with the above surgical procedure(s). We have discussed the potential benefits and risks of the procedure, side effects of the proposed treatment, the likelihood of the patient achieving the goals of the procedure, and any potential problems that might occur during the procedure or recuperation. Informed consent has been obtained.  Description of procedure:  The patient was taken to the operating room and general anesthesia was induced.  The patient was placed in the dorsal lithotomy position, prepped and draped in the usual sterile fashion, and preoperative antibiotics were administered. A preoperative time-out was performed.   A 21 French cystoscope was lubricated  and passed under direct vision.  The urethra was normal in caliber without stricture.  The prostate demonstrated mild lateral lobe enlargement with moderate bladder neck elevation.  Panendoscopy was performed and on the mid trigone and a <5 mm papillary bladder tumor was identified.  No other mucosal abnormalities were noted.  Attention was directed to the right ureteral orifice and a 0.038 Sensor wire was then advanced up the ureter into the renal pelvis under fluoroscopic guidance.  Mild resistance at the level of the calculus was noted and once the wire was advanced in the renal pelvis a torrent of dark urine with debris was noted effluxing from the UO.  A 4.5 Fr semirigid ureteroscope was then advanced into the ureter next to the guidewire and the calculus was identified in the proximal ureter.  To prevent proximal migration of the calculus a escape basket was placed in the stone was placed in the basket and able to be advanced approximately 2 cm distally. The basket was opened with the stone in place and the basket was disassembled.  The ureteroscope was removed and repassed alongside the guidewire and basket.  The calculus was then dusted with a 200 m holmium laser fiber on a setting of 0.2 J and frequency of 10 hz.  The calculus was advanced distally to the mid ureter and the escape basket was removed.  All significantly size fragments were then removed from the ureter with a zero tip nitinol basket.  Reinspection of the ureter revealed only dust like fragments.  Retrograde pyelogram was performed with findings as described above.  A 6 FR/26 CM stent was was placed under fluoroscopic guidance.  The wire was then removed with an adequate stent curl  noted in the renal pelvis as well as in the bladder.  A short tether was on the stent with the distal portion in the mid penile urethra.  The cystoscope was then passed per urethra.  The bladder was emptied and several small fragments were collected  to be sent for analysis.  The papillary lesion was removed in its entirety with a single cold cup biopsy.  The biopsy site was fulgurated with a Bugbee electrode.  The bladder was then emptied and the procedure ended.  The patient appeared to tolerate the procedure well and without complications.  After anesthetic reversal the patient was transported to the PACU in stable condition.   Plan: Office follow-up 1 week for cystoscopy/stent removal.

## 2019-11-02 NOTE — Transfer of Care (Signed)
Immediate Anesthesia Transfer of Care Note  Patient: Andre Stone  Procedure(s) Performed: CYSTOSCOPY/URETEROSCOPY/HOLMIUM LASER/STENT PLACEMENT (Right Ureter) CYSTOSCOPY WITH BIOPSY (N/A Bladder) CYSTOSCOPY WITH RETROGRADE PYELOGRAM (Right Ureter)  Patient Location: PACU  Anesthesia Type:General  Level of Consciousness: drowsy and patient cooperative  Airway & Oxygen Therapy: Patient Spontanous Breathing and Patient connected to face mask oxygen  Post-op Assessment: Report given to RN and Post -op Vital signs reviewed and stable  Post vital signs: Reviewed and stable  Last Vitals:  Vitals Value Taken Time  BP 132/75 11/02/19 1749  Temp    Pulse 80 11/02/19 1751  Resp 35 11/02/19 1751  SpO2 99 % 11/02/19 1751  Vitals shown include unvalidated device data.  Last Pain:  Vitals:   11/02/19 1602  TempSrc: Tympanic  PainSc: 0-No pain         Complications: No apparent anesthesia complications

## 2019-11-02 NOTE — Consult Note (Signed)
Urology Consult  Requesting physician: Dr. Charna Archer  Reason for consultation: Right ureteral calculus refractory to outpatient management  Chief Complaint: Kidney stone  History of Present Illness: Andre Stone is a 74 y.o. male with a history of uric acid stone disease was initially seen in the ED on 10/27/2019 with a 5-hour history of right flank pain similar to previous episodes of renal colic.  The pain was rated severe without identifiable precipitating, aggravating or alleviating factors. He had nausea without vomiting.  No fever or chills.  Stone protocol CT showed a 6 mm right proximal ureteral calculus with moderate hydronephrosis/hydroureter.  He had a punctate right lower pole renal calculus.  His pain resolved with parenteral analgesics and he was discharged on antiemetic/narcotic pain medication.  Since his ED visit he has had intermittent pain and nausea.  He had an appointment with Dr. Erlene Quan at 1300 today however this morning had progressively increasing pain necessitating an ED visit.  Repeat CT was performed which showed no significant stone progression.  He underwent ureteroscopic removal of distal calculus in 2017 with a stone composition of >80% uric acid.  He denies fever or chills.  Urinalysis today last week with a pH of 5.0.  Small blood on dipstick today and no RBCs/WBCs.  Urinalysis last week with greater than 50 RBCs and 11-30 WBCs.  Baseline creatinine is 1.3.  Creatinine in the ED last week was 1.5 and today 2.29.  He has had decreased p.o. intake the last 24 hours.  Past Medical History:  Diagnosis Date  . 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 Community acquired pneumonia 12/25/2015  . Essential (primary) hypertension 12/28/2015  . Gastro-esophageal reflux disease without esophagitis 12/28/2015  . History of kidney stones   . Hypertension     Past Surgical History:  Procedure Laterality Date  . CHOLECYSTECTOMY    .  CYSTOSCOPY/URETEROSCOPY/HOLMIUM LASER/STENT PLACEMENT Right 12/29/2015   Procedure: CYSTOSCOPY/URETEROSCOPY/ with basketing of stone, right ureteral /STENT PLACEMENT;  Surgeon: [name], MD;  Location: [facility] ORS;  Service: Urology;  Laterality: Right;  . TONSILLECTOMY      Home Medications:  No outpatient medications have been marked as taking for the 11/02/19 encounter [facility] Encounter).    Allergies:  PCN  Family History  Problem Relation Age of Onset  . Stroke Mother   . AAA (abdominal aortic aneurysm) Father   . Hypertension Father   . Bladder Cancer Neg Hx   . Kidney cancer Neg Hx   . Prostate cancer Neg Hx     Social History:  reports that he has never smoked. He has never used smokeless tobacco. He reports current alcohol use. He reports that he does not use drugs.  ROS: A complete review of systems was performed.  All systems are negative except for pertinent findings as noted.  Physical Exam:  Vital signs in last 24 hours: Temp:  [98 F (36.7 C)] 98 F (36.7 C) (02/09 1121) Pulse Rate:  [81-89] 81 (02/09 1200) Resp:  [18] 18 (02/09 1121) BP: (138-185)/(92-96) 138/92 (02/09 1200) SpO2:  [95 %-98 %] 95 % (02/09 1200) Weight:  [86.6 kg] 86.6 kg (02/09 1122) Constitutional:  Alert and oriented, No acute distress HEENT: [place] AT, moist mucus membranes.  Trachea midline, no masses Cardiovascular: Regular rate and rhythm, no clubbing, cyanosis, or edema. Respiratory: Normal respiratory effort, lungs clear bilaterally GI: Abdomen is soft, nontender, nondistended, no abdominal masses GU: No CVA tenderness Skin: No rashes, bruises  or suspicious lesions Lymph: No cervical or inguinal adenopathy Neurologic: Grossly intact, no focal deficits, moving all 4 extremities Psychiatric: Normal mood and affect   Laboratory Data:  Recent Labs    11/02/19 1125  WBC 9.7  HGB 13.6  HCT 39.6   Recent Labs    11/02/19 1125  NA 133*  K 4.3  CL 93*  CO2 26  GLUCOSE 132*    BUN 36*  CREATININE 2.29*  CALCIUM 9.2   No results for input(s): LABPT, INR in the last 72 hours. No results for input(s): LABURIN in the last 72 hours. Results for orders placed or performed during the hospital encounter of 12/29/15  Stone analysis     Status: None   Collection Time: 12/30/15 12:09 AM   Specimen: PATH Other; Calculus  Result Value Ref Range Status   Composition Comment  Corrected    Comment: Percentage (Represents the % composition)   Nidus No Nidus visualized  Corrected   Stone Weight KSTONE 22.0 mg Final   Color Orange  Final   Size Comment mm Final    Comment: Specimens received as a mixture of whole stones and fragments.   Ca Oxalate,Monohydr. 15 % Corrected   Ca phos cry stone ql IR 2 % Corrected   Uric Acid 83 % Corrected   STONE COMMENT Note:  Corrected    Comment: (NOTE) Please do not submit specimens on Q-Tips, in tape, on filters, or in liquids such as blood, urine or formalin.  This may cause unnecessary biohazards, erroneous results and/or delay in the processing of the specimen.    Photo Comment  Corrected    Comment: Photograph will follow under separate cover.   Comment: Comment  Corrected    Comment: (NOTE) Physician questions regarding Calculi Analysis contact [facility] at: [phone].    PLEASE NOTE: Comment  Corrected    Comment: (NOTE) Calculi report with photograph will follow via computer, mail or courier delivery.      Radiologic Imaging: DG Abdomen 1 View  Result Date: 11/02/2019 CLINICAL DATA:  Abdominal pain for 2 weeks EXAM: ABDOMEN - 1 VIEW COMPARISON:  10/27/2019 FINDINGS: Scattered large and small bowel gas is noted. Previously seen right proximal ureteral stone is not as well visualized on today's examination in part due to overlying bowel content. No other focal abnormality is noted. IMPRESSION: Previously seen right ureteral stone is not well visualized. The exam is somewhat limited due to overlying bowel gas. CT  urography may be helpful as clinically indicated. Electronically Signed   By: Inez Catalina M.D.   On: 11/02/2019 12:45   CT Renal Stone Study  Result Date: 11/02/2019 CLINICAL DATA:  History of prior right ureteral stone not well visualized on recent plain film examination with persistent pain EXAM: CT ABDOMEN AND PELVIS WITHOUT CONTRAST TECHNIQUE: Multidetector CT imaging of the abdomen and pelvis was performed following the standard protocol without IV contrast. COMPARISON:  None. FINDINGS: Lower chest: No acute abnormality. Hepatobiliary: No focal liver abnormality is seen. Status post cholecystectomy. No biliary dilatation. Pancreas: Unremarkable. No pancreatic ductal dilatation or surrounding inflammatory changes. Spleen: Normal in size without focal abnormality. Adrenals/Urinary Tract: Adrenal glands are again well visualized and within normal limits. The left kidney is unremarkable. Proximal right ureteral stone is again seen measuring almost 8 mm. Mild hydronephrosis is again identified. A tiny nonobstructing stone is noted in the lower pole of the right kidney. Large multiloculated cystic lesion is noted in the right upper pole stable from the  prior exam. The bladder is partially distended. Stomach/Bowel: Colon is well visualized. The appendix is within normal limits. No obstructive or inflammatory changes of the small bowel are seen. No gastric abnormality is noted. Vascular/Lymphatic: Aortic atherosclerosis. No enlarged abdominal or pelvic lymph nodes. Reproductive: Prostate is unremarkable. Other: No abdominal wall hernia or abnormality. No abdominopelvic ascites. Musculoskeletal: No acute or significant osseous findings. IMPRESSION: Stable proximal right ureteral stone with obstructive change. Tiny nonobstructing stone in the lower pole of the right kidney. Stable cystic lesion in the upper pole of the right kidney. Electronically Signed   By: Inez Catalina M.D.   On: 11/02/2019 13:33     Impression:  74 y.o male with a 6 mm right proximal ureteral calculus with nonprogression and failure of outpatient management.  The stone is not visualized on KUB making shockwave lithotripsy suboptimal.  I discussed ureteroscopic removal.  Due to persistent pain and nonprogression he would like to proceed with ureteroscopic removal today.  He has elevated creatinine most likely prerenal in etiology.  Plan:  The indications and nature of the planned procedure were discussed as well as the potential  benefits and expected outcome.  Alternatives have been discussed in detail. The most common complications and side effects were discussed including but not limited to infection/sepsis; blood loss; damage to urethra, bladder, ureter; need for multiple surgeries; need for prolonged stent placement as well as general anesthesia risks. Although uncommon he was also informed of the possibility that the calculus may not be able to be treated due to inability to obtain access to the upper ureter. In that event he would require stent placement and a follow-up procedure after a period of stent dilation. All of his questions were answered and he desires to proceed.    11/02/2019, 2:57 PM  John Giovanni,  MD

## 2019-11-02 NOTE — Anesthesia Preprocedure Evaluation (Signed)
Anesthesia Evaluation  Patient identified by MRN, date of birth, ID band Patient awake    Reviewed: Allergy & Precautions, H&P , NPO status , Patient's Chart, lab work & pertinent test results, reviewed documented beta blocker date and time   Airway Mallampati: II  TM Distance: >3 FB Neck ROM: full    Dental  (+) Teeth Intact   Pulmonary sleep apnea , pneumonia, resolved,    Pulmonary exam normal        Cardiovascular Exercise Tolerance: Good hypertension, On Medications negative cardio ROS Normal cardiovascular exam Rate:Normal     Neuro/Psych negative neurological ROS  negative psych ROS   GI/Hepatic Neg liver ROS, GERD  Medicated,  Endo/Other  negative endocrine ROS  Renal/GU Renal disease  negative genitourinary   Musculoskeletal   Abdominal   Peds  Hematology negative hematology ROS (+)   Anesthesia Other Findings   Reproductive/Obstetrics negative OB ROS                             Anesthesia Physical Anesthesia Plan  ASA: III  Anesthesia Plan: General LMA   Post-op Pain Management:    Induction:   PONV Risk Score and Plan:   Airway Management Planned:   Additional Equipment:   Intra-op Plan:   Post-operative Plan:   Informed Consent: I have reviewed the patients History and Physical, chart, labs and discussed the procedure including the risks, benefits and alternatives for the proposed anesthesia with the patient or authorized representative who has indicated his/her understanding and acceptance.       Plan Discussed with: CRNA  Anesthesia Plan Comments:         Anesthesia Quick Evaluation

## 2019-11-02 NOTE — Discharge Instructions (Signed)
AMBULATORY SURGERY  DISCHARGE INSTRUCTIONS   1) The drugs that you were given will stay in your system until tomorrow so for the next 24 hours you should not:  A) Drive an automobile B) Make any legal decisions C) Drink any alcoholic beverage   2) You may resume regular meals tomorrow.  Today it is better to start with liquids and gradually work up to solid foods.  You may eat anything you prefer, but it is better to start with liquids, then soup and crackers, and gradually work up to solid foods.   3) Please notify your doctor immediately if you have any unusual bleeding, trouble breathing, redness and pain at the surgery site, drainage, fever, or pain not relieved by medication.    4) Additional Instructions: Dr Dagoberto Reef office will call you with a post op appointment date        Please contact your physician with any problems or Same Day Surgery at [phone], Monday through Friday 6 am to 4 pm, or  at [facility] number at [phone].DISCHARGE INSTRUCTIONS FOR KIDNEY STONE/URETERAL STENT   MEDICATIONS:  1. Resume all your other meds from home.  2.  AZO (over-the-counter) can help with the burning/stinging when you urinate. 3. Oxybutinin is for bladder irritation/spasm, Rx was sent to your pharmacy.  ACTIVITY:  1. May resume regular activities in 24 hours. 2. No driving while on narcotic pain medications  3. Drink plenty of water  4. Continue to walk at home - you can still get blood clots when you are at home, so keep active, but don't over do it.  5. May return to work/school tomorrow or when you feel ready   BATHING:  1. You can shower.   SIGNS/SYMPTOMS TO CALL:  Please call us if you have a fever greater than 101.5, uncontrolled nausea/vomiting, uncontrolled pain, dizziness, unable to urinate, excessively bloody urine, chest pain, shortness of breath, leg swelling, leg pain, or any other concerns or questions.   You can reach [place] at  [phone].   FOLLOW-UP:  1. You will be contacted for an appt for stent removal

## 2019-11-02 NOTE — ED Triage Notes (Signed)
Says he has kidney stone, seen here recently.  Has appt with urologist this afternoon, but pain is really bad now.  Has not passed any.

## 2019-11-04 ENCOUNTER — Ambulatory Visit: Payer: Medicare PPO | Attending: Internal Medicine

## 2019-11-04 ENCOUNTER — Telehealth: Payer: Self-pay | Admitting: Urology

## 2019-11-04 DIAGNOSIS — Z23 Encounter for immunization: Secondary | ICD-10-CM

## 2019-11-04 LAB — SURGICAL PATHOLOGY

## 2019-11-04 NOTE — Telephone Encounter (Signed)
App made and patient notified

## 2019-11-04 NOTE — Progress Notes (Signed)
   Covid-19 Vaccination Clinic  Name:  Andre Stone    MRN: [id number] DOB: 15-Jun-1946  11/04/2019  [name] was observed post Covid-19 immunization for 15 minutes without incidence. He was provided with Vaccine Information Sheet and instruction to access the V-Safe system.   [name] was instructed to call 911 with any severe reactions post vaccine: Marland Kitchen Difficulty breathing  . Swelling of your face and throat  . A fast heartbeat  . A bad rash all over your body  . Dizziness and weakness    Immunizations Administered    Name Date Dose VIS Date Route   Pfizer COVID-19 Vaccine 11/04/2019  2:44 PM 0.3 mL 09/03/2019 Intramuscular   Manufacturer: [place]   Lot: [id number]   [place]: [id number]

## 2019-11-04 NOTE — Telephone Encounter (Signed)
-----   Message from [name], MD sent at 11/03/2019  9:07 AM EST ----- Regarding: Stent removal Will you add him on for cystoscopy/stent removal at 815 2/17?  Thanks

## 2019-11-05 NOTE — Anesthesia Postprocedure Evaluation (Signed)
Anesthesia Post Note  Patient: Andre Stone  Procedure(s) Performed: CYSTOSCOPY/URETEROSCOPY/HOLMIUM LASER/STENT PLACEMENT (Right Ureter) CYSTOSCOPY WITH BIOPSY (N/A Bladder) CYSTOSCOPY WITH RETROGRADE PYELOGRAM (Right Ureter)  Patient location during evaluation: PACU Anesthesia Type: General Level of consciousness: awake and alert Pain management: pain level controlled Vital Signs Assessment: post-procedure vital signs reviewed and stable Respiratory status: spontaneous breathing, nonlabored ventilation, respiratory function stable and patient connected to nasal cannula oxygen Cardiovascular status: blood pressure returned to baseline and stable Postop Assessment: no apparent nausea or vomiting Anesthetic complications: no     Last Vitals:  Vitals:   11/02/19 1819 11/02/19 1840  BP: 122/72 127/77  Pulse: 70 72  Resp: 10 14  Temp: (!) 36.1 C (!) 36.2 C  SpO2: 96% 97%    Last Pain:  Vitals:   11/03/19 0854  TempSrc:   PainSc: 0-No pain                 Molli Barrows

## 2019-11-10 ENCOUNTER — Ambulatory Visit (INDEPENDENT_AMBULATORY_CARE_PROVIDER_SITE_OTHER): Payer: Medicare PPO | Admitting: Urology

## 2019-11-10 ENCOUNTER — Other Ambulatory Visit: Payer: Self-pay | Admitting: Urology

## 2019-11-10 ENCOUNTER — Telehealth: Payer: Self-pay

## 2019-11-10 ENCOUNTER — Other Ambulatory Visit: Payer: Self-pay

## 2019-11-10 ENCOUNTER — Encounter: Payer: Self-pay | Admitting: Urology

## 2019-11-10 VITALS — BP 109/69 | HR 73 | Ht 72.0 in | Wt 191.0 lb

## 2019-11-10 DIAGNOSIS — N200 Calculus of kidney: Secondary | ICD-10-CM

## 2019-11-10 DIAGNOSIS — C679 Malignant neoplasm of bladder, unspecified: Secondary | ICD-10-CM

## 2019-11-10 LAB — CALCULI, WITH PHOTOGRAPH (CLINICAL LAB)
Uric Acid Calculi: 100 %
Weight Calculi: 14 mg

## 2019-11-10 LAB — URINALYSIS, COMPLETE
Bilirubin, UA: NEGATIVE
Glucose, UA: NEGATIVE
Ketones, UA: NEGATIVE
Nitrite, UA: NEGATIVE
Specific Gravity, UA: 1.015 (ref 1.005–1.030)
Urobilinogen, Ur: 0.2 mg/dL (ref 0.2–1.0)
pH, UA: 5 (ref 5.0–7.5)

## 2019-11-10 LAB — MICROSCOPIC EXAMINATION

## 2019-11-10 MED ORDER — LEVOFLOXACIN 500 MG PO TABS
500.0000 mg | ORAL_TABLET | Freq: Once | ORAL | Status: AC
  Administered 2019-11-10: 500 mg via ORAL

## 2019-11-10 NOTE — Telephone Encounter (Signed)
Patient called stating that he is having severe flank pain after stent removal this morning. He was encouraged to utilize oxybutinin and flomax. He is most likely experiencing a ureteral spasm. If the medications do not help his pain or if it worsens and he develops fever 101 or greater or vomiting due to pain he is to call back

## 2019-11-12 DIAGNOSIS — C679 Malignant neoplasm of bladder, unspecified: Secondary | ICD-10-CM | POA: Insufficient documentation

## 2019-11-12 NOTE — Progress Notes (Signed)
Indications: Patient is 74 y.o. male who underwent ureteroscopic removal of an 8 mm right proximal ureteral calculus on 11/02/2019.  No postoperative problems except for moderate stent symptoms.  The patient is presenting today for stent removal.  Procedure:  Flexible Cystoscopy with stent removal [id number])  Timeout was performed and the correct patient, procedure and participants were identified.    Description:  The patient was prepped and draped in the usual sterile fashion. Flexible cystosopy was performed.  The stent was visualized, grasped, and removed intact without difficulty. The patient tolerated the procedure well.  A single dose of oral antibiotics was given.  Complications:  None  Plan: -Instructed to call for fever/flank pain post stent removal -Stone analysis pending but most likely uric acid.  Recommend metabolic evaluation to include 24 urine study and blood work -He was incidentally noted to have an approximately 3 mm bladder tumor which was removed entirely via biopsy.  The tumor showed 20% increased mitotic features felt consistent with high-grade disease.  The remaining 80% was low-grade pathology.  Based on the small size and no significant risk factors would not recommend intravesical therapy.  Follow-up cystoscopy in 3 months.  John Giovanni, MD

## 2020-02-02 ENCOUNTER — Ambulatory Visit (INDEPENDENT_AMBULATORY_CARE_PROVIDER_SITE_OTHER): Payer: Medicare PPO | Admitting: Urology

## 2020-02-02 ENCOUNTER — Encounter: Payer: Self-pay | Admitting: Urology

## 2020-02-02 ENCOUNTER — Other Ambulatory Visit: Payer: Self-pay

## 2020-02-02 VITALS — BP 134/80 | HR 74 | Ht 72.0 in | Wt 193.0 lb

## 2020-02-02 DIAGNOSIS — C679 Malignant neoplasm of bladder, unspecified: Secondary | ICD-10-CM | POA: Diagnosis not present

## 2020-02-02 LAB — URINALYSIS, COMPLETE
Bilirubin, UA: NEGATIVE
Glucose, UA: NEGATIVE
Ketones, UA: NEGATIVE
Leukocytes,UA: NEGATIVE
Nitrite, UA: NEGATIVE
Protein,UA: NEGATIVE
Specific Gravity, UA: 1.025 (ref 1.005–1.030)
Urobilinogen, Ur: 0.2 mg/dL (ref 0.2–1.0)
pH, UA: 5 (ref 5.0–7.5)

## 2020-02-02 LAB — MICROSCOPIC EXAMINATION: Bacteria, UA: NONE SEEN

## 2020-02-02 NOTE — Progress Notes (Signed)
   02/02/2020  CC:  Chief Complaint  Patient presents with  . Cysto   HPI: Andre Stone is a 74 y/o white male who presents today for the evaluation and management of his cystoscopy.  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 A&Ox3.   No respiratory distress   Abd soft, NT, ND Normal phallus with bilateral descended testicles  Cystoscopy Procedure Note  Patient identification was confirmed, informed consent was obtained, and patient was prepped using Betadine solution.  Lidocaine jelly was administered per urethral meatus.     Pre-Procedure: - Inspection reveals a normal caliber urethral meatus.  Procedure: The flexible cystoscope was introduced without difficulty - No urethral strictures/lesions are present. - Mild lateral lobe enlargement prostate - Normal bladder neck - Bilateral ureteral orifices identified - Bladder mucosa  reveals no ulcers, tumors, or lesions - No bladder stones - No trabeculation  Retroflexion shows no tumor  Post-Procedure: - Patient tolerated the procedure well  Assessment/ Plan: 1.  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  History uric acid stone disease -Metabolic evaluation with [id number] urine and blood work  I, [employer], am acting as a Education administrator for Dr. Nicki Reaper C. Darey Hershberger.   I have reviewed the above documentation for accuracy and completeness, and I agree with the above.   [name], MD

## 2020-04-10 ENCOUNTER — Other Ambulatory Visit: Payer: Self-pay

## 2020-04-10 ENCOUNTER — Other Ambulatory Visit: Payer: Medicare PPO

## 2020-04-21 ENCOUNTER — Ambulatory Visit: Payer: Self-pay | Admitting: Orthopedic Surgery

## 2020-04-27 ENCOUNTER — Other Ambulatory Visit: Payer: Self-pay | Admitting: Urology

## 2020-05-11 ENCOUNTER — Encounter: Payer: Self-pay | Admitting: Urology

## 2020-05-11 ENCOUNTER — Other Ambulatory Visit: Payer: Self-pay

## 2020-05-11 ENCOUNTER — Ambulatory Visit: Payer: Medicare PPO | Admitting: Urology

## 2020-05-11 VITALS — BP 157/102 | HR 76 | Ht 72.0 in | Wt 200.0 lb

## 2020-05-11 DIAGNOSIS — E790 Hyperuricemia without signs of inflammatory arthritis and tophaceous disease: Secondary | ICD-10-CM

## 2020-05-11 DIAGNOSIS — R82991 Hypocitraturia: Secondary | ICD-10-CM | POA: Diagnosis not present

## 2020-05-11 DIAGNOSIS — N200 Calculus of kidney: Secondary | ICD-10-CM | POA: Diagnosis not present

## 2020-05-11 DIAGNOSIS — C679 Malignant neoplasm of bladder, unspecified: Secondary | ICD-10-CM | POA: Diagnosis not present

## 2020-05-11 DIAGNOSIS — R82993 Hyperuricosuria: Secondary | ICD-10-CM

## 2020-05-11 NOTE — Progress Notes (Signed)
05/11/2020 8:59 PM   Andre Stone 1946/08/12 [id number]  Referring provider: Dion Body, MD [facility] [place],  [place] [address]  Chief Complaint  Patient presents with  . Nephrolithiasis    Urologic history: 1.  Uric acid stone disease -Ureteroscopy basket extraction right distal ureteral calculus 12/2015 -Ureteroscopy laser lithotripsy/extraction of right distal ureteral calculus 10/2019  2. 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 male presents for follow-up of uric acid urolithiasis.   Denies flank, abdominal or pelvic pain  Urinary frequency secondary to HCTZ  Denies gross hematuria  Metabolic evaluation completed 04/10/2020  Urine volume excellent at 2.5 L  Citrate level low at 207 mg/24 hours  Mild hyperuricosuria 1.029g/24 hr  Uric acid supersaturation normal at 0.86  Mild hyperuricemia 8.4 mg/dL  No recurrent stone symptoms    PMH: Past Medical History:  Diagnosis Date  . 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 Community acquired pneumonia 12/25/2015  . Essential (primary) hypertension 12/28/2015  . Gastro-esophageal reflux disease without esophagitis 12/28/2015  . History of kidney stones   . Hypertension     Surgical History: Past Surgical History:  Procedure Laterality Date  . CHOLECYSTECTOMY    . CYSTOSCOPY W/ RETROGRADES Right 11/02/2019   Procedure: CYSTOSCOPY WITH RETROGRADE PYELOGRAM;  Surgeon: [name], MD;  Location: [facility];  Service: Urology;  Laterality: Right;  . CYSTOSCOPY WITH BIOPSY N/A 11/02/2019   Procedure: CYSTOSCOPY WITH BIOPSY;  Surgeon: [name], MD;  Location: [facility];  Service: Urology;  Laterality: N/A;  . CYSTOSCOPY/URETEROSCOPY/HOLMIUM LASER/STENT PLACEMENT Right 12/29/2015   Procedure: CYSTOSCOPY/URETEROSCOPY/ with basketing of stone, right ureteral /STENT PLACEMENT;   Surgeon: [name], MD;  Location: [facility];  Service: Urology;  Laterality: Right;  . CYSTOSCOPY/URETEROSCOPY/HOLMIUM LASER/STENT PLACEMENT Right 11/02/2019   Procedure: CYSTOSCOPY/URETEROSCOPY/HOLMIUM LASER/STENT PLACEMENT;  Surgeon: [name], MD;  Location: [facility];  Service: Urology;  Laterality: Right;  . TONSILLECTOMY      Home Medications:  Reviewed  Allergies:  Penicillin  Family History: Family History  Problem Relation Age of Onset  . Stroke Mother   . AAA (abdominal aortic aneurysm) Father   . Hypertension Father   . Bladder Cancer Neg Hx   . Kidney cancer Neg Hx   . Prostate cancer Neg Hx     Social History:  reports that he has never smoked. He has never used smokeless tobacco. He reports current alcohol use. He reports that he does not use drugs.   Physical Exam: BP (!) 157/102   Pulse 76   Ht 6' (1.829 m)   Wt 200 lb (90.7 kg)   BMI 27.12 kg/m   Constitutional:  Alert and oriented, No acute distress. HEENT: [place] AT, moist mucus membranes.  Trachea midline, no masses. Cardiovascular: No clubbing, cyanosis, or edema. Respiratory: Normal respiratory effort, no increased work of breathing. Skin: No rashes, bruises or suspicious lesions. Neurologic: Grossly intact, no focal deficits, moving all 4 extremities. Psychiatric: Normal mood and affect.   Assessment & Plan:    1.  Recurrent uric acid stone disease  24-hour urine study with mild hyperuricosuria however uric acid supersaturation is normal.  Urine pH was 5.9, hypocitraturia noted.  He has mild hyperuricemia which may be secondary to diuretic therapy  Recommended he continue his excellent fluid intake to keep urine output >2.5 L/day  Start LithoLyte one stick twice daily for his hypocitraturia and  will lower his urine pH  Discussed moderate protein intake  If above corrected and there is continued stone formation consider allopurinol  2. Ta urothelial carcinoma  Scheduled for  surveillance cystoscopy 10/2020   [name], MD  [facility] [address] [place], [place] [address] [phone]

## 2020-07-10 ENCOUNTER — Ambulatory Visit: Payer: Medicare PPO | Attending: Internal Medicine

## 2020-07-10 DIAGNOSIS — Z23 Encounter for immunization: Secondary | ICD-10-CM

## 2020-07-10 NOTE — Progress Notes (Signed)
   Covid-19 Vaccination Clinic  Name:  Andre Stone    MRN: [id number] DOB: November 18, 1945  07/10/2020  Andre Stone was observed post Covid-19 immunization for 15 minutes without incident. He was provided with Vaccine Information Sheet and instruction to access the V-Safe system.   Andre Stone was instructed to call 911 with any severe reactions post vaccine: Marland Kitchen Difficulty breathing  . Swelling of face and throat  . A fast heartbeat  . A bad rash all over body  . Dizziness and weakness

## 2020-10-23 ENCOUNTER — Telehealth: Payer: Self-pay

## 2020-10-23 NOTE — Telephone Encounter (Signed)
Called pt, no answer. LM for pt to call back so appt can be rescheduled.

## 2020-11-06 ENCOUNTER — Other Ambulatory Visit: Payer: Self-pay | Admitting: Urology

## 2020-11-08 ENCOUNTER — Encounter: Payer: Self-pay | Admitting: Urology

## 2020-11-08 ENCOUNTER — Other Ambulatory Visit: Payer: Self-pay

## 2020-11-08 ENCOUNTER — Ambulatory Visit: Payer: Medicare PPO | Admitting: Urology

## 2020-11-08 VITALS — BP 170/92 | HR 90 | Ht 72.0 in | Wt 197.0 lb

## 2020-11-08 DIAGNOSIS — Z8551 Personal history of malignant neoplasm of bladder: Secondary | ICD-10-CM

## 2020-11-08 DIAGNOSIS — N200 Calculus of kidney: Secondary | ICD-10-CM

## 2020-11-08 LAB — MICROSCOPIC EXAMINATION: Bacteria, UA: NONE SEEN

## 2020-11-08 LAB — URINALYSIS, COMPLETE
Bilirubin, UA: NEGATIVE
Glucose, UA: NEGATIVE
Ketones, UA: NEGATIVE
Leukocytes,UA: NEGATIVE
Nitrite, UA: NEGATIVE
Protein,UA: NEGATIVE
Specific Gravity, UA: 1.02 (ref 1.005–1.030)
Urobilinogen, Ur: 0.2 mg/dL (ref 0.2–1.0)
pH, UA: 5.5 (ref 5.0–7.5)

## 2020-11-08 NOTE — Progress Notes (Signed)
   11/08/20  CC:  Chief Complaint  Patient presents with  . Cysto   Urologic history: 1.  Ta urothelial carcinoma bladder, low-grade -Incidentally discovered < 5 mm papillary bladder tumor at time of ureteroscopy February 2021 with pathology as above  2.  Uric acid nephrolithiasis -Hypocitraturia, mild hyperuricemia and hyperuricosuria.  Urine pH 5.9 -On potassium citrate    HPI: Presents today for surveillance cystoscopy.  Denies gross hematuria or recurrent stone pain  Blood pressure (!) 170/92, pulse 90, height 6' (1.829 m), weight 197 lb (89.4 kg). NED. A&Ox3.   No respiratory distress   Abd soft, NT, ND Normal phallus with bilateral descended testicles  Cystoscopy Procedure Note  Patient identification was confirmed, informed consent was obtained, and patient was prepped using Betadine solution.  Lidocaine jelly was administered per urethral meatus.     Pre-Procedure: - Inspection reveals a normal caliber urethral meatus.  Procedure: The flexible cystoscope was introduced without difficulty - No urethral strictures/lesions are present. - Mild to moderate lateral lobe enlargement prostate  - Normal bladder neck - Bilateral ureteral orifices identified - Bladder mucosa  reveals no ulcers, tumors, or lesions - No bladder stones - No trabeculation  Retroflexion shows no abnormalities   Post-Procedure: - Patient tolerated the procedure well  Assessment/ Plan:  No evidence recurrent tumor  Follow-up surveillance cystoscopy 1 year   [name], MD

## 2020-11-14 DIAGNOSIS — C689 Malignant neoplasm of urinary organ, unspecified: Secondary | ICD-10-CM | POA: Insufficient documentation

## 2021-05-15 ENCOUNTER — Other Ambulatory Visit (HOSPITAL_COMMUNITY): Payer: Self-pay | Admitting: Family Medicine

## 2021-05-15 ENCOUNTER — Other Ambulatory Visit: Payer: Self-pay | Admitting: Family Medicine

## 2021-05-15 DIAGNOSIS — Z8249 Family history of ischemic heart disease and other diseases of the circulatory system: Secondary | ICD-10-CM

## 2021-05-15 DIAGNOSIS — Z136 Encounter for screening for cardiovascular disorders: Secondary | ICD-10-CM

## 2021-06-07 ENCOUNTER — Other Ambulatory Visit: Payer: Self-pay | Admitting: Family Medicine

## 2021-06-07 ENCOUNTER — Ambulatory Visit
Admission: RE | Admit: 2021-06-07 | Discharge: 2021-06-07 | Disposition: A | Discharge status: Home / Self Care | Payer: Medicare PPO | Source: Ambulatory Visit | Attending: Family Medicine | Admitting: Family Medicine

## 2021-06-07 ENCOUNTER — Other Ambulatory Visit: Payer: Medicare PPO

## 2021-06-07 ENCOUNTER — Other Ambulatory Visit: Payer: Self-pay

## 2021-06-07 DIAGNOSIS — Z8249 Family history of ischemic heart disease and other diseases of the circulatory system: Secondary | ICD-10-CM

## 2021-06-07 DIAGNOSIS — Z136 Encounter for screening for cardiovascular disorders: Secondary | ICD-10-CM

## 2021-06-07 DIAGNOSIS — I700 Atherosclerosis of aorta: Secondary | ICD-10-CM | POA: Insufficient documentation

## 2021-09-07 ENCOUNTER — Other Ambulatory Visit: Payer: Self-pay | Admitting: Family Medicine

## 2021-09-07 DIAGNOSIS — M5416 Radiculopathy, lumbar region: Secondary | ICD-10-CM

## 2021-09-26 ENCOUNTER — Other Ambulatory Visit: Payer: Self-pay

## 2021-09-26 ENCOUNTER — Ambulatory Visit
Admission: RE | Admit: 2021-09-26 | Discharge: 2021-09-26 | Disposition: A | Discharge status: Home / Self Care | Payer: Medicare PPO | Source: Ambulatory Visit | Attending: Family Medicine | Admitting: Family Medicine

## 2021-09-26 DIAGNOSIS — M5416 Radiculopathy, lumbar region: Secondary | ICD-10-CM | POA: Diagnosis present

## 2021-11-08 ENCOUNTER — Other Ambulatory Visit: Payer: Self-pay | Admitting: Urology

## 2021-11-09 ENCOUNTER — Encounter: Payer: Self-pay | Admitting: Urology

## 2021-11-09 ENCOUNTER — Ambulatory Visit: Payer: Medicare PPO | Admitting: Urology

## 2021-11-09 ENCOUNTER — Other Ambulatory Visit: Payer: Self-pay

## 2021-11-09 VITALS — BP 164/84 | HR 80 | Ht 72.0 in | Wt 194.0 lb

## 2021-11-09 DIAGNOSIS — N200 Calculus of kidney: Secondary | ICD-10-CM

## 2021-11-09 DIAGNOSIS — Z8551 Personal history of malignant neoplasm of bladder: Secondary | ICD-10-CM | POA: Diagnosis not present

## 2021-11-09 LAB — MICROSCOPIC EXAMINATION
Bacteria, UA: NONE SEEN
Epithelial Cells (non renal): NONE SEEN /hpf (ref 0–10)
RBC, Urine: NONE SEEN /hpf (ref 0–2)

## 2021-11-09 LAB — URINALYSIS, COMPLETE
Bilirubin, UA: NEGATIVE
Glucose, UA: NEGATIVE
Ketones, UA: NEGATIVE
Leukocytes,UA: NEGATIVE
Nitrite, UA: NEGATIVE
Protein,UA: NEGATIVE
RBC, UA: NEGATIVE
Specific Gravity, UA: 1.025 (ref 1.005–1.030)
Urobilinogen, Ur: 0.2 mg/dL (ref 0.2–1.0)
pH, UA: 5.5 (ref 5.0–7.5)

## 2021-11-09 NOTE — Progress Notes (Signed)
° °  11/09/21  CC:  Chief Complaint  Patient presents with   Cysto    Urologic history: 1.  Ta urothelial carcinoma bladder, low-grade -Incidentally discovered < 5 mm papillary bladder tumor at time of ureteroscopy February 2021 with pathology as above   2.  Uric acid nephrolithiasis -Hypocitraturia, mild hyperuricemia and hyperuricosuria.  Urine pH 5.9 -On potassium citrate  HPI: No complaints.  Denies gross hematuria  Blood pressure (!) 164/84, pulse 80, height 6' (1.829 m), weight 194 lb (88 kg). NED. A&Ox3.   No respiratory distress   Abd soft, NT, ND Normal phallus with bilateral descended testicles  Cystoscopy Procedure Note  Patient identification was confirmed, informed consent was obtained, and patient was prepped using Betadine solution.  Lidocaine jelly was administered per urethral meatus.     Pre-Procedure: - Inspection reveals a normal caliber ureteral meatus.  Procedure: The flexible cystoscope was introduced without difficulty - No urethral strictures/lesions are present. -Mild to moderate lateral lobe enlargement prostate  - Normal bladder neck - Bilateral ureteral orifices identified - Bladder mucosa  reveals no ulcers, tumors, or lesions - No bladder stones - No trabeculation  Retroflexion shows no tumor or intravesical median lobe   Post-Procedure: - Patient tolerated the procedure well  Assessment/ Plan: No recurrent urothelial carcinoma Follow-up surveillance cystoscopy 1 year   [name], MD

## 2022-01-07 ENCOUNTER — Encounter: Payer: Self-pay | Admitting: Internal Medicine

## 2022-01-07 ENCOUNTER — Inpatient Hospital Stay
Admission: EM | Admit: 2022-01-07 | Discharge: 2022-01-09 | DRG: 657 | Disposition: A | Discharge status: Home / Self Care | Payer: Medicare PPO | Attending: Internal Medicine | Admitting: Internal Medicine

## 2022-01-07 ENCOUNTER — Other Ambulatory Visit: Payer: Self-pay

## 2022-01-07 ENCOUNTER — Emergency Department: Payer: Medicare PPO

## 2022-01-07 DIAGNOSIS — N1831 Chronic kidney disease, stage 3a: Secondary | ICD-10-CM | POA: Diagnosis present

## 2022-01-07 DIAGNOSIS — N179 Acute kidney failure, unspecified: Secondary | ICD-10-CM | POA: Diagnosis present

## 2022-01-07 DIAGNOSIS — G4730 Sleep apnea, unspecified: Secondary | ICD-10-CM | POA: Diagnosis present

## 2022-01-07 DIAGNOSIS — K5790 Diverticulosis of intestine, part unspecified, without perforation or abscess without bleeding: Secondary | ICD-10-CM

## 2022-01-07 DIAGNOSIS — K5732 Diverticulitis of large intestine without perforation or abscess without bleeding: Secondary | ICD-10-CM | POA: Diagnosis present

## 2022-01-07 DIAGNOSIS — H6690 Otitis media, unspecified, unspecified ear: Secondary | ICD-10-CM | POA: Diagnosis present

## 2022-01-07 DIAGNOSIS — R319 Hematuria, unspecified: Secondary | ICD-10-CM | POA: Diagnosis present

## 2022-01-07 DIAGNOSIS — C679 Malignant neoplasm of bladder, unspecified: Secondary | ICD-10-CM | POA: Diagnosis not present

## 2022-01-07 DIAGNOSIS — Z7982 Long term (current) use of aspirin: Secondary | ICD-10-CM

## 2022-01-07 DIAGNOSIS — I10 Essential (primary) hypertension: Secondary | ICD-10-CM | POA: Diagnosis present

## 2022-01-07 DIAGNOSIS — I7121 Aneurysm of the ascending aorta, without rupture: Secondary | ICD-10-CM | POA: Diagnosis present

## 2022-01-07 DIAGNOSIS — I2510 Atherosclerotic heart disease of native coronary artery without angina pectoris: Secondary | ICD-10-CM | POA: Diagnosis present

## 2022-01-07 DIAGNOSIS — I700 Atherosclerosis of aorta: Secondary | ICD-10-CM | POA: Diagnosis present

## 2022-01-07 DIAGNOSIS — N200 Calculus of kidney: Secondary | ICD-10-CM

## 2022-01-07 DIAGNOSIS — I714 Abdominal aortic aneurysm, without rupture, unspecified: Secondary | ICD-10-CM | POA: Diagnosis present

## 2022-01-07 DIAGNOSIS — N1330 Unspecified hydronephrosis: Secondary | ICD-10-CM | POA: Diagnosis present

## 2022-01-07 DIAGNOSIS — M549 Dorsalgia, unspecified: Secondary | ICD-10-CM | POA: Diagnosis present

## 2022-01-07 DIAGNOSIS — N23 Unspecified renal colic: Secondary | ICD-10-CM | POA: Diagnosis not present

## 2022-01-07 DIAGNOSIS — Z8249 Family history of ischemic heart disease and other diseases of the circulatory system: Secondary | ICD-10-CM

## 2022-01-07 DIAGNOSIS — Z9049 Acquired absence of other specified parts of digestive tract: Secondary | ICD-10-CM

## 2022-01-07 DIAGNOSIS — K5900 Constipation, unspecified: Secondary | ICD-10-CM | POA: Diagnosis present

## 2022-01-07 DIAGNOSIS — Z87442 Personal history of urinary calculi: Secondary | ICD-10-CM

## 2022-01-07 DIAGNOSIS — Z79891 Long term (current) use of opiate analgesic: Secondary | ICD-10-CM

## 2022-01-07 DIAGNOSIS — K219 Gastro-esophageal reflux disease without esophagitis: Secondary | ICD-10-CM | POA: Diagnosis present

## 2022-01-07 DIAGNOSIS — G8929 Other chronic pain: Secondary | ICD-10-CM | POA: Diagnosis present

## 2022-01-07 DIAGNOSIS — N201 Calculus of ureter: Secondary | ICD-10-CM | POA: Diagnosis not present

## 2022-01-07 DIAGNOSIS — I129 Hypertensive chronic kidney disease with stage 1 through stage 4 chronic kidney disease, or unspecified chronic kidney disease: Secondary | ICD-10-CM | POA: Diagnosis present

## 2022-01-07 DIAGNOSIS — Z79899 Other long term (current) drug therapy: Secondary | ICD-10-CM

## 2022-01-07 DIAGNOSIS — N132 Hydronephrosis with renal and ureteral calculous obstruction: Secondary | ICD-10-CM | POA: Diagnosis present

## 2022-01-07 DIAGNOSIS — Z20822 Contact with and (suspected) exposure to covid-19: Secondary | ICD-10-CM | POA: Diagnosis present

## 2022-01-07 LAB — CBC WITH DIFFERENTIAL/PLATELET
Abs Immature Granulocytes: 0.04 10*3/uL (ref 0.00–0.07)
Basophils Absolute: 0 10*3/uL (ref 0.0–0.1)
Basophils Relative: 0 %
Eosinophils Absolute: 0 10*3/uL (ref 0.0–0.5)
Eosinophils Relative: 0 %
HCT: 43.3 % (ref 39.0–52.0)
Hemoglobin: 14.4 g/dL (ref 13.0–17.0)
Immature Granulocytes: 0 %
Lymphocytes Relative: 10 %
Lymphs Abs: 1 10*3/uL (ref 0.7–4.0)
MCH: 29.8 pg (ref 26.0–34.0)
MCHC: 33.3 g/dL (ref 30.0–36.0)
MCV: 89.5 fL (ref 80.0–100.0)
Monocytes Absolute: 0.6 10*3/uL (ref 0.1–1.0)
Monocytes Relative: 6 %
Neutro Abs: 8.9 10*3/uL — ABNORMAL HIGH (ref 1.7–7.7)
Neutrophils Relative %: 84 %
Platelets: 173 10*3/uL (ref 150–400)
RBC: 4.84 MIL/uL (ref 4.22–5.81)
RDW: 13.1 % (ref 11.5–15.5)
Smear Review: NORMAL
WBC: 10.6 10*3/uL — ABNORMAL HIGH (ref 4.0–10.5)
nRBC: 0 % (ref 0.0–0.2)

## 2022-01-07 LAB — RESP PANEL BY RT-PCR (FLU A&B, COVID) ARPGX2
Influenza A by PCR: NEGATIVE
Influenza B by PCR: NEGATIVE
SARS Coronavirus 2 by RT PCR: NEGATIVE

## 2022-01-07 LAB — LIPASE, BLOOD: Lipase: 47 U/L (ref 11–51)

## 2022-01-07 LAB — COMPREHENSIVE METABOLIC PANEL
ALT: 26 U/L (ref 0–44)
AST: 26 U/L (ref 15–41)
Albumin: 4.3 g/dL (ref 3.5–5.0)
Alkaline Phosphatase: 63 U/L (ref 38–126)
Anion gap: 10 (ref 5–15)
BUN: 33 mg/dL — ABNORMAL HIGH (ref 8–23)
CO2: 25 mmol/L (ref 22–32)
Calcium: 9.1 mg/dL (ref 8.9–10.3)
Chloride: 103 mmol/L (ref 98–111)
Creatinine, Ser: 1.69 mg/dL — ABNORMAL HIGH (ref 0.61–1.24)
GFR, Estimated: 42 mL/min — ABNORMAL LOW (ref >60)
Glucose, Bld: 158 mg/dL — ABNORMAL HIGH (ref 70–99)
Potassium: 3.9 mmol/L (ref 3.5–5.1)
Sodium: 138 mmol/L (ref 135–145)
Total Bilirubin: 0.6 mg/dL (ref 0.3–1.2)
Total Protein: 8.1 g/dL (ref 6.5–8.1)

## 2022-01-07 LAB — URINALYSIS, ROUTINE W REFLEX MICROSCOPIC
Bacteria, UA: NONE SEEN
Bilirubin Urine: NEGATIVE
Glucose, UA: NEGATIVE mg/dL
Ketones, ur: NEGATIVE mg/dL
Leukocytes,Ua: NEGATIVE
Nitrite: NEGATIVE
Protein, ur: 100 mg/dL — AB
RBC / HPF: >50 RBC/hpf — ABNORMAL HIGH (ref 0–5)
Specific Gravity, Urine: >1.046 — ABNORMAL HIGH (ref 1.005–1.030)
pH: 5 (ref 5.0–8.0)

## 2022-01-07 LAB — TROPONIN I (HIGH SENSITIVITY)
Troponin I (High Sensitivity): 6 ng/L (ref <18)
Troponin I (High Sensitivity): 7 ng/L (ref <18)

## 2022-01-07 LAB — LACTIC ACID, PLASMA
Lactic Acid, Venous: 1.9 mmol/L (ref 0.5–1.9)
Lactic Acid, Venous: 1.6 mmol/L (ref 0.5–1.9)

## 2022-01-07 MED ORDER — LISINOPRIL 10 MG PO TABS: 40.0000 mg | ORAL_TABLET | Freq: Every day | ORAL | Status: DC

## 2022-01-07 MED ORDER — TRAZODONE HCL 100 MG PO TABS: 100.0000 mg | ORAL_TABLET | Freq: Every evening | ORAL | Status: DC | PRN

## 2022-01-07 MED ORDER — ONDANSETRON HCL 4 MG/2ML IJ SOLN
4.0000 mg | Freq: Four times a day (QID) | INTRAMUSCULAR | Status: DC | PRN
  Administered 2022-01-07 – 2022-01-08 (×2): 4 mg via INTRAVENOUS
  Filled 2022-01-07: qty 2

## 2022-01-07 MED ORDER — HYDROMORPHONE HCL 1 MG/ML IJ SOLN
1.0000 mg | Freq: Once | INTRAMUSCULAR | Status: AC
  Administered 2022-01-07: 1 mg via INTRAVENOUS
  Filled 2022-01-07: qty 1

## 2022-01-07 MED ORDER — HYDROCHLOROTHIAZIDE 12.5 MG PO TABS: 12.5000 mg | ORAL_TABLET | Freq: Every day | ORAL | Status: DC

## 2022-01-07 MED ORDER — OMEGA-3-ACID ETHYL ESTERS 1 G PO CAPS
1.0000 g | ORAL_CAPSULE | Freq: Every day | ORAL | Status: DC
  Administered 2022-01-09: 1 g via ORAL
  Filled 2022-01-07 (×3): qty 1

## 2022-01-07 MED ORDER — ONDANSETRON HCL 4 MG PO TABS: 4.0000 mg | ORAL_TABLET | Freq: Four times a day (QID) | ORAL | Status: DC | PRN

## 2022-01-07 MED ORDER — FISH OIL 1000 MG PO CAPS
1000.0000 mg | ORAL_CAPSULE | Freq: Every day | ORAL | Status: DC
Start: 2022-01-07 — End: 2022-01-07

## 2022-01-07 MED ORDER — ONDANSETRON HCL 4 MG/2ML IJ SOLN
4.0000 mg | Freq: Once | INTRAMUSCULAR | Status: AC
Start: 2022-01-07 — End: 2022-01-07
  Administered 2022-01-07: 4 mg via INTRAVENOUS
  Filled 2022-01-07: qty 2

## 2022-01-07 MED ORDER — HYDROMORPHONE HCL 1 MG/ML IJ SOLN
1.0000 mg | INTRAMUSCULAR | Status: DC | PRN
  Administered 2022-01-07 – 2022-01-08 (×5): 1 mg via INTRAVENOUS
  Filled 2022-01-07 (×6): qty 1

## 2022-01-07 MED ORDER — ADULT MULTIVITAMIN W/MINERALS CH
1.0000 | ORAL_TABLET | Freq: Every day | ORAL | Status: DC
  Administered 2022-01-07 – 2022-01-09 (×2): 1 via ORAL
  Filled 2022-01-07 (×3): qty 1

## 2022-01-07 MED ORDER — SODIUM CHLORIDE 0.45 % IV SOLN: INTRAVENOUS | Status: DC

## 2022-01-07 MED ORDER — IOHEXOL 350 MG/ML SOLN
75.0000 mL | Freq: Once | INTRAVENOUS | Status: AC | PRN
  Administered 2022-01-07: 75 mL via INTRAVENOUS

## 2022-01-07 MED ORDER — SILDENAFIL CITRATE 20 MG PO TABS
20.0000 mg | ORAL_TABLET | Freq: Three times a day (TID) | ORAL | Status: DC
Start: 2022-01-07 — End: 2022-01-07

## 2022-01-07 MED ORDER — AMLODIPINE BESYLATE 10 MG PO TABS
10.0000 mg | ORAL_TABLET | Freq: Every day | ORAL | Status: DC
  Administered 2022-01-07 – 2022-01-09 (×2): 10 mg via ORAL
  Filled 2022-01-07 (×2): qty 1
  Filled 2022-01-07: qty 2

## 2022-01-07 MED ORDER — CEFTRIAXONE SODIUM 2 G IJ SOLR
2.0000 g | Freq: Once | INTRAMUSCULAR | Status: AC
  Administered 2022-01-07: 2 g via INTRAVENOUS
  Filled 2022-01-07: qty 20

## 2022-01-07 MED ORDER — HYDROMORPHONE HCL 1 MG/ML IJ SOLN
1.0000 mg | Freq: Once | INTRAMUSCULAR | Status: AC
Start: 2022-01-07 — End: 2022-01-07
  Administered 2022-01-07: 1 mg via INTRAVENOUS
  Filled 2022-01-07: qty 1

## 2022-01-07 MED ORDER — PANTOPRAZOLE SODIUM 40 MG PO TBEC
40.0000 mg | DELAYED_RELEASE_TABLET | Freq: Every day | ORAL | Status: DC
  Administered 2022-01-07 – 2022-01-09 (×2): 40 mg via ORAL
  Filled 2022-01-07 (×3): qty 1

## 2022-01-07 MED ORDER — SODIUM CHLORIDE 0.9 % IV SOLN
2.0000 g | INTRAVENOUS | Status: DC
  Filled 2022-01-07 (×2): qty 20

## 2022-01-07 MED ORDER — SODIUM CHLORIDE 0.9 % IV BOLUS
500.0000 mL | Freq: Once | INTRAVENOUS | Status: AC
  Administered 2022-01-07: 500 mL via INTRAVENOUS

## 2022-01-07 MED ORDER — PRAVASTATIN SODIUM 20 MG PO TABS
20.0000 mg | ORAL_TABLET | Freq: Every day | ORAL | Status: DC
Start: 2022-01-07 — End: 2022-01-09
  Administered 2022-01-07 – 2022-01-08 (×2): 20 mg via ORAL
  Filled 2022-01-07 (×2): qty 1

## 2022-01-07 NOTE — Assessment & Plan Note (Addendum)
Blood pressure is uncontrolled secondary to pain ?Discontinue lisinopril and HCTZ due to slightly worsening of patient's renal function (1.2 >> 1.69) ?We we will start patient on amlodipine 10 mg daily ? ?

## 2022-01-07 NOTE — H&P (View-Only) (Signed)
? ?Urology Consult ? ?Requesting physician: Duffy Bruce, MD ? ?Reason for consultation: Right ureteral calculus with obstruction ? ?Chief Complaint: Kidney stone ? ?History of Present Illness: Andre Stone is a 76 y.o. followed for recurrent stone disease and history of low-grade urothelial carcinoma of the bladder. ? ?Presented to ED this morning with a 2-3-day history of mild epigastric abdominal discomfort.  6 hours prior to presentation his pain became severe and was rated 10/10.  It was described as sharp and stabbing and radiating to the right flank region.  + nausea without vomiting ?History of AAA and a CT chest abdomen pelvis was performed which showed no evidence of dissection and a 4 mm right proximal ureteral calculus with hydronephrosis. ?Urinalysis did show mild pyuria with 11-20 WBC however >50 RBC.  No bacteria were seen ?Mild leukocytosis 10.6 ?Lactate levels normal 1.9 and 1.6 on repeat ?He required several doses of hydromorphone for pain management and was not felt to be candidate for discharge ?He was admitted to the hospital service for pain control and was also started on empiric IV antibiotics ?He states he has not been able to pass a stone and has had 2 prior ureteroscopic stone removal ?Asked to see for stent placement versus definitive stone treatment ? ? ?Past Medical History:  ?Diagnosis Date  ? Apnea, sleep 12/28/2015  ? 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 Community acquired pneumonia 12/25/2015  ? Essential (primary) hypertension 12/28/2015  ? Gastro-esophageal reflux disease without esophagitis 12/28/2015  ? History of kidney stones   ? Hypertension   ? ? ?Past Surgical History:  ?Procedure Laterality Date  ? CHOLECYSTECTOMY    ? CYSTOSCOPY W/ RETROGRADES Right 11/02/2019  ? Procedure: CYSTOSCOPY WITH RETROGRADE PYELOGRAM;  Surgeon: [name], MD;  Location: [facility];  Service: Urology;  Laterality: Right;  ? CYSTOSCOPY WITH BIOPSY N/A 11/02/2019  ? Procedure: CYSTOSCOPY WITH  BIOPSY;  Surgeon: [name], MD;  Location: [facility];  Service: Urology;  Laterality: N/A;  ? CYSTOSCOPY/URETEROSCOPY/HOLMIUM LASER/STENT PLACEMENT Right 12/29/2015  ? Procedure: CYSTOSCOPY/URETEROSCOPY/ with basketing of stone, right ureteral /STENT PLACEMENT;  Surgeon: [name], MD;  Location: [facility];  Service: Urology;  Laterality: Right;  ? CYSTOSCOPY/URETEROSCOPY/HOLMIUM LASER/STENT PLACEMENT Right 11/02/2019  ? Procedure: CYSTOSCOPY/URETEROSCOPY/HOLMIUM LASER/STENT PLACEMENT;  Surgeon: [name], MD;  Location: [facility];  Service: Urology;  Laterality: Right;  ? TONSILLECTOMY    ? ? ?Home Medications:  ?Current Meds  ?Medication Sig  ? aspirin EC 81 MG tablet Take 81 mg by mouth daily.  ? doxycycline (VIBRAMYCIN) 100 MG capsule Take 100 mg by mouth 2 (two) times daily.  ? esomeprazole (NEXIUM) 20 MG capsule Take 20 mg by mouth daily at 12 noon.  ? hydrochlorothiazide (HYDRODIURIL) 12.5 MG tablet Take 12.5 mg by mouth daily.  ? lisinopril (PRINIVIL,ZESTRIL) 40 MG tablet Take 40 mg by mouth daily.  ? lovastatin (MEVACOR) 20 MG tablet Take 20 mg by mouth at bedtime.  ? Multiple Vitamins-Minerals (MULTIVITAMIN WITH MINERALS) tablet Take 1 tablet by mouth daily.  ? ofloxacin (FLOXIN) 0.3 % OTIC solution Place 5 drops into the right ear 2 (two) times daily.  ? Omega-3 Fatty Acids (FISH OIL) 1000 MG CAPS Take 1,000 mg by mouth daily.  ? sildenafil (REVATIO) 20 MG tablet Take 20 mg by mouth daily as needed (ED).  ? traZODone (DESYREL) 100 MG tablet Take 100 mg by mouth at bedtime as needed.  ? ? ?Allergies:  ?PCN ?Glucosamine ? ?Family History  ?Problem Relation  Age of Onset  ? Stroke Mother   ? AAA (abdominal aortic aneurysm) Father   ? Hypertension Father   ? Bladder Cancer Neg Hx   ? Kidney cancer Neg Hx   ? Prostate cancer Neg Hx   ? ? ?Social History:  reports that he has never smoked. He has never been exposed to tobacco smoke. He has never used smokeless tobacco. He reports current alcohol use  of about 1.0 standard drink per week. He reports that he does not use drugs. ? ?ROS: ?A complete review of systems was performed.  All systems are negative except for pertinent findings as noted. ? ?Physical Exam:  ?Vital signs in last 24 hours: ?Temp:  [98.1 ?F (36.7 ?C)-99.1 ?F (37.3 ?C)] 99.1 ?F (37.3 ?C) (04/17 2105) ?Pulse Rate:  [73-80] 80 (04/17 2105) ?Resp:  [11-23] 20 (04/17 2105) ?BP: (142-213)/(88-118) 152/96 (04/17 2105) ?SpO2:  [92 %-100 %] 95 % (04/17 2105) ?Weight:  [90.3 kg] 90.3 kg (04/17 0857) ?Constitutional:  Alert and oriented, No acute distress ?HEENT: [place] AT, moist mucus membranes.  Trachea midline, no masses ?Cardiovascular: Regular rate and rhythm, no clubbing, cyanosis, or edema. ?Respiratory: Normal respiratory effort, lungs clear bilaterally ?GI: Abdomen is soft, nontender, nondistended, no abdominal masses ?GU: No CVA tenderness ?Skin: No rashes, bruises or suspicious lesions ?Lymph: No cervical or inguinal adenopathy ?Neurologic: Grossly intact, no focal deficits, moving all 4 extremities ?Psychiatric: Normal mood and affect ? ? ?Laboratory Data:  ?Recent Labs  ?  01/07/22 ?0921  ?WBC 10.6*  ?HGB 14.4  ?HCT 43.3  ? ?Recent Labs  ?  01/07/22 ?0921  ?NA 138  ?K 3.9  ?CL 103  ?CO2 25  ?GLUCOSE 158*  ?BUN 33*  ?CREATININE 1.69*  ?CALCIUM 9.1  ? ?No results for input(s): LABPT, INR in the last 72 hours. ?No results for input(s): LABURIN in the last 72 hours. ?Results for orders placed or performed during the hospital encounter of 01/07/22  ?Resp Panel by RT-PCR (Flu A&B, Covid) Nasopharyngeal Swab     Status: None  ? Collection Time: 01/07/22  9:21 AM  ? Specimen: Nasopharyngeal Swab; Nasopharyngeal(NP) swabs in vial transport medium  ?Result Value Ref Range Status  ? SARS Coronavirus 2 by RT PCR NEGATIVE NEGATIVE Final  ?  Comment: (NOTE) ?SARS-CoV-2 target nucleic acids are NOT DETECTED. ? ?The SARS-CoV-2 RNA is generally detectable in upper respiratory ?specimens during the acute phase of  infection. The lowest ?concentration of SARS-CoV-2 viral copies this assay can detect is ?138 copies/mL. A negative result does not preclude SARS-Cov-2 ?infection and should not be used as the sole basis for treatment or ?other patient management decisions. A negative result may occur with  ?improper specimen collection/handling, submission of specimen other ?than nasopharyngeal swab, presence of viral mutation(s) within the ?areas targeted by this assay, and inadequate number of viral ?copies(<138 copies/mL). A negative result must be combined with ?clinical observations, patient history, and epidemiological ?information. The expected result is Negative. ? ?Fact Sheet for Patients:  ?EntrepreneurPulse.com.au ? ?Fact Sheet for Healthcare Providers:  ?IncredibleEmployment.be ? ?This test is no t yet approved or cleared by the [place] FDA and  ?has been authorized for detection and/or diagnosis of SARS-CoV-2 by ?FDA under an Emergency Use Authorization (EUA). This EUA will remain  ?in effect (meaning this test can be used) for the duration of the ?COVID-19 declaration under Section 564(b)(1) of the Act, 21 ?U.S.C.section 360bbb-3(b)(1), unless the authorization is terminated  ?or revoked sooner.  ? ? ?  ?  Influenza A by PCR NEGATIVE NEGATIVE Final  ? Influenza B by PCR NEGATIVE NEGATIVE Final  ?  Comment: (NOTE) ?The Xpert Xpress SARS-CoV-2/FLU/RSV plus assay is intended as an aid ?in the diagnosis of influenza from Nasopharyngeal swab specimens and ?should not be used as a sole basis for treatment. Nasal washings and ?aspirates are unacceptable for Xpert Xpress SARS-CoV-2/FLU/RSV ?testing. ? ?Fact Sheet for Patients: ?EntrepreneurPulse.com.au ? ?Fact Sheet for Healthcare Providers: ?IncredibleEmployment.be ? ?This test is not yet approved or cleared by the [place] FDA and ?has been authorized for detection and/or diagnosis of SARS-CoV-2  by ?FDA under an Emergency Use Authorization (EUA). This EUA will remain ?in effect (meaning this test can be used) for the duration of the ?COVID-19 declaration under Section 564(b)(1) of the Act,

## 2022-01-07 NOTE — ED Notes (Signed)
CT aware that patient now has PIV.  ?

## 2022-01-07 NOTE — Assessment & Plan Note (Addendum)
Patient presents for evaluation of severe low back pain and is noted to have right perinephric edema with moderate to severe right ?hydronephrosis secondary to a 5 mm stone in the proximal right ?Ureter. ?Noted to have slight worsening of his renal function, serum creatinine 1.2 >> 1.69 ?Supportive care with pain control, IV fluid hydration and antiemetics ?Urology consult ?

## 2022-01-07 NOTE — H&P (Addendum)
?History and Physical  ? ? ?Patient: Andre Stone DDU:[id number] DOB: [birth year] ?DOA: 01/07/2022 ?DOS: the patient was seen and examined on 01/07/2022 ?PCP: Dion Body, MD  ?Patient coming from: Home ? ?Chief Complaint:  ?Chief Complaint  ?Patient presents with  ? Abdominal Pain  ? ?HPI: Andre Stone is a 76 y.o. male with medical history significant for nephrolithiasis status post lithotripsy, hypertension, GERD, sleep apnea who presents to the ER for evaluation of abdominal pain. ?Patient states that his symptoms started 1 day prior to presentation after dinner last night and was mostly periumbilical.  He rated his pain a 5 x 10 in intensity at its worst.  He remained a constant discomfort when he went to bed and he awoke at about 4 AM this morning with severe pain in his lower back rated a 9 x 10 in intensity at its worst.  Pain was nonradiating and was associated with nausea and multiple episodes of emesis.  She was unable to keep any liquid.  He denies having any fever or chills and denies having any frequency of urination or dysuria. ?His wife had administered an oral antiemetic at home which he was unable to keep down and so he presented to the ER for further evaluation. ?He usually has a bowel movement daily but has not had one today.  He denies having any chest pain, no shortness of breath, no headache, no dizziness, no lightheadedness, no leg swelling, no blurred vision or any focal deficit. ?Review of Systems: As mentioned in the history of present illness. All other systems reviewed and are negative. ?Past Medical History:  ?Diagnosis Date  ? Apnea, sleep 12/28/2015  ? 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 Community acquired pneumonia 12/25/2015  ? Essential (primary) hypertension 12/28/2015  ? Gastro-esophageal reflux disease without esophagitis 12/28/2015  ? History of kidney stones   ? Hypertension   ? ?Past Surgical History:  ?Procedure Laterality Date  ? CHOLECYSTECTOMY    ? CYSTOSCOPY  W/ RETROGRADES Right 11/02/2019  ? Procedure: CYSTOSCOPY WITH RETROGRADE PYELOGRAM;  Surgeon: [name], MD;  Location: [facility];  Service: Urology;  Laterality: Right;  ? CYSTOSCOPY WITH BIOPSY N/A 11/02/2019  ? Procedure: CYSTOSCOPY WITH BIOPSY;  Surgeon: [name], MD;  Location: [facility];  Service: Urology;  Laterality: N/A;  ? CYSTOSCOPY/URETEROSCOPY/HOLMIUM LASER/STENT PLACEMENT Right 12/29/2015  ? Procedure: CYSTOSCOPY/URETEROSCOPY/ with basketing of stone, right ureteral /STENT PLACEMENT;  Surgeon: [name], MD;  Location: [facility];  Service: Urology;  Laterality: Right;  ? CYSTOSCOPY/URETEROSCOPY/HOLMIUM LASER/STENT PLACEMENT Right 11/02/2019  ? Procedure: CYSTOSCOPY/URETEROSCOPY/HOLMIUM LASER/STENT PLACEMENT;  Surgeon: [name], MD;  Location: [facility];  Service: Urology;  Laterality: Right;  ? TONSILLECTOMY    ? ?Social History:  reports that he has never smoked. He has never used smokeless tobacco. He reports current alcohol use. He reports that he does not use drugs. ? ? ? ?Family History  ?Problem Relation Age of Onset  ? Stroke Mother   ? AAA (abdominal aortic aneurysm) Father   ? Hypertension Father   ? Bladder Cancer Neg Hx   ? Kidney cancer Neg Hx   ? Prostate cancer Neg Hx   ? ? ?Prior to Admission medications   ?Medication Sig Start Date End Date Taking? Authorizing Provider  ?hydrochlorothiazide (HYDRODIURIL) 12.5 MG tablet Take 12.5 mg by mouth daily. 10/05/21  Yes [provider]  ?lisinopril (PRINIVIL,ZESTRIL) 40 MG tablet Take 40 mg by mouth daily.   Yes [provider]  ?aspirin  EC 81 MG tablet Take 81 mg by mouth daily.    [provider]  ?doxycycline (VIBRAMYCIN) 100 MG capsule Take 100 mg by mouth 2 (two) times daily. 01/04/22   [provider]  ?esomeprazole (NEXIUM) 20 MG capsule Take 20 mg by mouth daily at 12 noon.    [provider]  ?gabapentin (NEURONTIN) 100 MG capsule Take 100 mg by mouth at bedtime. 10/24/21    [provider]  ?lovastatin (MEVACOR) 20 MG tablet Take 20 mg by mouth at bedtime. 10/07/19   [provider]  ?Multiple Vitamins-Minerals (MULTIVITAMIN WITH MINERALS) tablet Take 1 tablet by mouth daily.    [provider]  ?ofloxacin (FLOXIN) 0.3 % OTIC solution Place 5 drops into the right ear 2 (two) times daily. 01/04/22   [provider]  ?Omega-3 Fatty Acids (FISH OIL) 1000 MG CAPS Take 1,000 mg by mouth daily.    [provider]  ?sildenafil (REVATIO) 20 MG tablet Take by mouth. 11/18/17   [provider]  ?traZODone (DESYREL) 100 MG tablet Take 100 mg by mouth at bedtime as needed. 10/07/19   [provider]  ? ? ?Physical Exam: ?Vitals:  ? 01/07/22 1120 01/07/22 1121 01/07/22 1130 01/07/22 1230  ?BP: (!) 172/109  (!) 158/102 (!) 184/112  ?Pulse:  74 78 75  ?Resp: '11 14 17 15  '$ ?Temp:      ?TempSrc:      ?SpO2: 100% 96% 100% 99%  ?Weight:      ?Height:      ? ?Physical Exam ?Vitals and nursing note reviewed.  ?Constitutional:   ?   Appearance: He is well-developed.  ?HENT:  ?   Head: Normocephalic and atraumatic.  ?   Mouth/Throat:  ?   Comments: Dry mucous membranes ?Cardiovascular:  ?   Rate and Rhythm: Normal rate and regular rhythm.  ?Pulmonary:  ?   Effort: Pulmonary effort is normal.  ?   Breath sounds: Normal breath sounds.  ?Abdominal:  ?   General: Abdomen is flat. Bowel sounds are normal.  ?   Palpations: Abdomen is soft.  ?   Tenderness: There is abdominal tenderness in the periumbilical area.  ?   Comments: No CVA tenderness  ?Skin: ?   General: Skin is warm and dry.  ?Neurological:  ?   General: No focal deficit present.  ?   Mental Status: He is alert.  ?Psychiatric:     ?   Mood and Affect: Mood normal.     ?   Behavior: Behavior normal.  ? ? ?Data Reviewed: ?Relevant notes from primary care and specialist visits, past discharge summaries as available in EHR, including Care Everywhere. ?Prior diagnostic testing as pertinent to current  admission diagnoses ?Updated medications and problem lists for reconciliation ?ED course, including vitals, labs, imaging, treatment and response to treatment ?Triage notes, nursing and pharmacy notes and ED provider's notes ?Notable results as noted in HPI ?Labs reviewed by me shows a white count of 10.6, hemoglobin 14.4, hematocrit 43.3, platelet 173, sodium 138, potassium 3.9, chloride 103, bicarb 25, glucose 158, BUN 33, creatinine 1.69, calcium 9.1 ?UA is sterile with greater than 50 RBCs ?CT angiogram of chest/abdomen and pelvis shows no evidence of aortic dissection. Right perinephric edema with moderate to severe right hydronephrosis secondary to a 5 mm stone in the proximal right ?ureter. Mild diverticulitis involving the descending colon. Ascending aortic aneurysm, stable. Recommend annual imaging followup by CTA or MRA. Aortic atherosclerosis. Coronary artery calcification. ?  Twelve-lead EKG reviewed by me shows sinus rhythm with a right bundle branch block ?There are no new results to review at this time. ? ?Assessment and Plan: ?* Hydronephrosis, right ?Patient presents for evaluation of severe low back pain and is noted to have right perinephric edema with moderate to severe right ?hydronephrosis secondary to a 5 mm stone in the proximal right ?Ureter. ?Supportive care with pain control, IV fluid hydration and antiemetics ?Urology consult ? ?Diverticulitis large intestine w/o perforation or abscess w/o bleeding ?Patient presents for evaluation of abdominal pain and imaging shows mild diverticulitis involving the descending colon. ?Supportive care with pain control, antiemetics and IV fluids ?Place patient empirically on antibiotic therapy with IV Rocephin ? ?Essential (primary) hypertension ?Blood pressure is uncontrolled secondary to pain ?Discontinue lisinopril and HCTZ due to slightly worsening of patient's renal function (1.2 >> 1.69) ?We we will start patient on amlodipine 10 mg  daily ? ? ?Gastro-esophageal reflux disease without esophagitis ?Stable ?Continue oral PPI ? ? ? ? ? Advance Care Planning:   Code Status: Full Code  ? ?Consults: Urology ? ?Family Communication: Greater than 50% of time was

## 2022-01-07 NOTE — Assessment & Plan Note (Signed)
Stable ?Continue oral PPI ?

## 2022-01-07 NOTE — Assessment & Plan Note (Signed)
Patient presents for evaluation of abdominal pain and imaging shows mild diverticulitis involving the descending colon. ?Supportive care with pain control, antiemetics and IV fluids ?Place patient empirically on antibiotic therapy with IV Rocephin ?

## 2022-01-07 NOTE — ED Notes (Signed)
Pt transported to CT via stretcher.  

## 2022-01-07 NOTE — ED Triage Notes (Signed)
Pt in with co mid abd pain that radiate through to back with and n.v.  ?

## 2022-01-07 NOTE — Consult Note (Signed)
? ?Urology Consult ? ?Requesting physician: Duffy Bruce, MD ? ?Reason for consultation: Right ureteral calculus with obstruction ? ?Chief Complaint: Kidney stone ? ?History of Present Illness: Andre Stone is a 76 y.o. followed for recurrent stone disease and history of low-grade urothelial carcinoma of the bladder. ? ?Presented to ED this morning with a 2-3-day history of mild epigastric abdominal discomfort.  6 hours prior to presentation his pain became severe and was rated 10/10.  It was described as sharp and stabbing and radiating to the right flank region.  + nausea without vomiting ?History of AAA and a CT chest abdomen pelvis was performed which showed no evidence of dissection and a 4 mm right proximal ureteral calculus with hydronephrosis. ?Urinalysis did show mild pyuria with 11-20 WBC however >50 RBC.  No bacteria were seen ?Mild leukocytosis 10.6 ?Lactate levels normal 1.9 and 1.6 on repeat ?He required several doses of hydromorphone for pain management and was not felt to be candidate for discharge ?He was admitted to the hospital service for pain control and was also started on empiric IV antibiotics ?He states he has not been able to pass a stone and has had 2 prior ureteroscopic stone removal ?Asked to see for stent placement versus definitive stone treatment ? ? ?Past Medical History:  ?Diagnosis Date  ? Apnea, sleep 12/28/2015  ? 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 Community acquired pneumonia 12/25/2015  ? Essential (primary) hypertension 12/28/2015  ? Gastro-esophageal reflux disease without esophagitis 12/28/2015  ? History of kidney stones   ? Hypertension   ? ? ?Past Surgical History:  ?Procedure Laterality Date  ? CHOLECYSTECTOMY    ? CYSTOSCOPY W/ RETROGRADES Right 11/02/2019  ? Procedure: CYSTOSCOPY WITH RETROGRADE PYELOGRAM;  Surgeon: [name], MD;  Location: [facility];  Service: Urology;  Laterality: Right;  ? CYSTOSCOPY WITH BIOPSY N/A 11/02/2019  ? Procedure: CYSTOSCOPY WITH  BIOPSY;  Surgeon: [name], MD;  Location: [facility];  Service: Urology;  Laterality: N/A;  ? CYSTOSCOPY/URETEROSCOPY/HOLMIUM LASER/STENT PLACEMENT Right 12/29/2015  ? Procedure: CYSTOSCOPY/URETEROSCOPY/ with basketing of stone, right ureteral /STENT PLACEMENT;  Surgeon: [name], MD;  Location: [facility];  Service: Urology;  Laterality: Right;  ? CYSTOSCOPY/URETEROSCOPY/HOLMIUM LASER/STENT PLACEMENT Right 11/02/2019  ? Procedure: CYSTOSCOPY/URETEROSCOPY/HOLMIUM LASER/STENT PLACEMENT;  Surgeon: [name], MD;  Location: [facility];  Service: Urology;  Laterality: Right;  ? TONSILLECTOMY    ? ? ?Home Medications:  ?Current Meds  ?Medication Sig  ? aspirin EC 81 MG tablet Take 81 mg by mouth daily.  ? doxycycline (VIBRAMYCIN) 100 MG capsule Take 100 mg by mouth 2 (two) times daily.  ? esomeprazole (NEXIUM) 20 MG capsule Take 20 mg by mouth daily at 12 noon.  ? hydrochlorothiazide (HYDRODIURIL) 12.5 MG tablet Take 12.5 mg by mouth daily.  ? lisinopril (PRINIVIL,ZESTRIL) 40 MG tablet Take 40 mg by mouth daily.  ? lovastatin (MEVACOR) 20 MG tablet Take 20 mg by mouth at bedtime.  ? Multiple Vitamins-Minerals (MULTIVITAMIN WITH MINERALS) tablet Take 1 tablet by mouth daily.  ? ofloxacin (FLOXIN) 0.3 % OTIC solution Place 5 drops into the right ear 2 (two) times daily.  ? Omega-3 Fatty Acids (FISH OIL) 1000 MG CAPS Take 1,000 mg by mouth daily.  ? sildenafil (REVATIO) 20 MG tablet Take 20 mg by mouth daily as needed (ED).  ? traZODone (DESYREL) 100 MG tablet Take 100 mg by mouth at bedtime as needed.  ? ? ?Allergies:  ?PCN ?Glucosamine ? ?Family History  ?Problem Relation  Age of Onset  ? Stroke Mother   ? AAA (abdominal aortic aneurysm) Father   ? Hypertension Father   ? Bladder Cancer Neg Hx   ? Kidney cancer Neg Hx   ? Prostate cancer Neg Hx   ? ? ?Social History:  reports that he has never smoked. He has never been exposed to tobacco smoke. He has never used smokeless tobacco. He reports current alcohol use  of about 1.0 standard drink per week. He reports that he does not use drugs. ? ?ROS: ?A complete review of systems was performed.  All systems are negative except for pertinent findings as noted. ? ?Physical Exam:  ?Vital signs in last 24 hours: ?Temp:  [98.1 ?F (36.7 ?C)-99.1 ?F (37.3 ?C)] 99.1 ?F (37.3 ?C) (04/17 2105) ?Pulse Rate:  [73-80] 80 (04/17 2105) ?Resp:  [11-23] 20 (04/17 2105) ?BP: (142-213)/(88-118) 152/96 (04/17 2105) ?SpO2:  [92 %-100 %] 95 % (04/17 2105) ?Weight:  [90.3 kg] 90.3 kg (04/17 0857) ?Constitutional:  Alert and oriented, No acute distress ?HEENT: [place] AT, moist mucus membranes.  Trachea midline, no masses ?Cardiovascular: Regular rate and rhythm, no clubbing, cyanosis, or edema. ?Respiratory: Normal respiratory effort, lungs clear bilaterally ?GI: Abdomen is soft, nontender, nondistended, no abdominal masses ?GU: No CVA tenderness ?Skin: No rashes, bruises or suspicious lesions ?Lymph: No cervical or inguinal adenopathy ?Neurologic: Grossly intact, no focal deficits, moving all 4 extremities ?Psychiatric: Normal mood and affect ? ? ?Laboratory Data:  ?Recent Labs  ?  01/07/22 ?0921  ?WBC 10.6*  ?HGB 14.4  ?HCT 43.3  ? ?Recent Labs  ?  01/07/22 ?0921  ?NA 138  ?K 3.9  ?CL 103  ?CO2 25  ?GLUCOSE 158*  ?BUN 33*  ?CREATININE 1.69*  ?CALCIUM 9.1  ? ?No results for input(s): LABPT, INR in the last 72 hours. ?No results for input(s): LABURIN in the last 72 hours. ?Results for orders placed or performed during the hospital encounter of 01/07/22  ?Resp Panel by RT-PCR (Flu A&B, Covid) Nasopharyngeal Swab     Status: None  ? Collection Time: 01/07/22  9:21 AM  ? Specimen: Nasopharyngeal Swab; Nasopharyngeal(NP) swabs in vial transport medium  ?Result Value Ref Range Status  ? SARS Coronavirus 2 by RT PCR NEGATIVE NEGATIVE Final  ?  Comment: (NOTE) ?SARS-CoV-2 target nucleic acids are NOT DETECTED. ? ?The SARS-CoV-2 RNA is generally detectable in upper respiratory ?specimens during the acute phase of  infection. The lowest ?concentration of SARS-CoV-2 viral copies this assay can detect is ?138 copies/mL. A negative result does not preclude SARS-Cov-2 ?infection and should not be used as the sole basis for treatment or ?other patient management decisions. A negative result may occur with  ?improper specimen collection/handling, submission of specimen other ?than nasopharyngeal swab, presence of viral mutation(s) within the ?areas targeted by this assay, and inadequate number of viral ?copies(<138 copies/mL). A negative result must be combined with ?clinical observations, patient history, and epidemiological ?information. The expected result is Negative. ? ?Fact Sheet for Patients:  ?EntrepreneurPulse.com.au ? ?Fact Sheet for Healthcare Providers:  ?IncredibleEmployment.be ? ?This test is no t yet approved or cleared by the [place] FDA and  ?has been authorized for detection and/or diagnosis of SARS-CoV-2 by ?FDA under an Emergency Use Authorization (EUA). This EUA will remain  ?in effect (meaning this test can be used) for the duration of the ?COVID-19 declaration under Section 564(b)(1) of the Act, 21 ?U.S.C.section 360bbb-3(b)(1), unless the authorization is terminated  ?or revoked sooner.  ? ? ?  ?  Influenza A by PCR NEGATIVE NEGATIVE Final  ? Influenza B by PCR NEGATIVE NEGATIVE Final  ?  Comment: (NOTE) ?The Xpert Xpress SARS-CoV-2/FLU/RSV plus assay is intended as an aid ?in the diagnosis of influenza from Nasopharyngeal swab specimens and ?should not be used as a sole basis for treatment. Nasal washings and ?aspirates are unacceptable for Xpert Xpress SARS-CoV-2/FLU/RSV ?testing. ? ?Fact Sheet for Patients: ?EntrepreneurPulse.com.au ? ?Fact Sheet for Healthcare Providers: ?IncredibleEmployment.be ? ?This test is not yet approved or cleared by the [place] FDA and ?has been authorized for detection and/or diagnosis of SARS-CoV-2  by ?FDA under an Emergency Use Authorization (EUA). This EUA will remain ?in effect (meaning this test can be used) for the duration of the ?COVID-19 declaration under Section 564(b)(1) of the Act,

## 2022-01-07 NOTE — ED Provider Notes (Signed)
? ?[facility] ?Provider Note ? ? ? Event Date/Time  ? First MD Initiated Contact with Patient 01/07/22 [phone]   ?  (approximate) ? ? ?History  ? ?Abdominal Pain ? ? ?HPI ? ?EDKER Stone is a 76 y.o. male here with severe abdominal pain.  The patient states that symptoms started 2 to 3 days ago with initially mild epigastric abdominal discomfort.  Over the last 6 hours or so, the pain is becoming severe, 10 out of 10, sharp, stabbing, and radiates to his back.  He has had associated nausea and dry heaves.  No significant actual emesis.  No hematemesis.  No blood in stools.  He does state he has been relatively constipated which is somewhat new for him.  Denies fevers or chills.  No urinary symptoms.  No known sick contacts.  Of note, patient has a history of reported AAA although recent ultrasound was unremarkable.  He is currently being treated for otitis media with oral doxycycline.  He also has chronic back pain for which she is to PT as well as a history of bilateral nephrolithiasis for which he is required multiple interventions in the past. ?  ? ? ?Physical Exam  ? ?Triage Vital Signs: ?ED Triage Vitals  ?Enc Vitals Group  ?   BP 01/07/22 0859 (!) 213/118  ?   Pulse Rate 01/07/22 0856 74  ?   Resp 01/07/22 0856 20  ?   Temp --   ?   Temp Source 01/07/22 0856 Oral  ?   SpO2 01/07/22 0856 93 %  ?   Weight 01/07/22 0857 199 lb (90.3 kg)  ?   Height 01/07/22 0857 6' (1.829 m)  ?   Head Circumference --   ?   Peak Flow --   ?   Pain Score 01/07/22 0857 9  ?   Pain Loc --   ?   Pain Edu? --   ?   Excl. in [place]? --   ? ? ?Most recent vital signs: ?Vitals:  ? 01/07/22 1130 01/07/22 1230  ?BP: (!) 158/102 (!) 184/112  ?Pulse: 78 75  ?Resp: 17 15  ?Temp:    ?SpO2: 100% 99%  ? ? ? ?General: Awake, appears to be uncomfortable, wincing in pain. ?CV:  Good peripheral perfusion.  ?Resp:  Normal effort.  ?Abd:  No distention.  Marked epigastric tenderness.  Slight guarding.  No rigidity.  No  rebound. ?Other:  Slightly diaphoretic, vomiting. ? ? ?ED Results / Procedures / Treatments  ? ?Labs ?(all labs ordered are listed, but only abnormal results are displayed) ?Labs Reviewed  ?CBC WITH DIFFERENTIAL/PLATELET - Abnormal; Notable for the following components:  ?    Result Value  ? WBC 10.6 (*)   ? Neutro Abs 8.9 (*)   ? All other components within normal limits  ?COMPREHENSIVE METABOLIC PANEL - Abnormal; Notable for the following components:  ? Glucose, Bld 158 (*)   ? BUN 33 (*)   ? Creatinine, Ser 1.69 (*)   ? GFR, Estimated 42 (*)   ? All other components within normal limits  ?URINALYSIS, ROUTINE W REFLEX MICROSCOPIC - Abnormal; Notable for the following components:  ? Color, Urine YELLOW (*)   ? APPearance HAZY (*)   ? Specific Gravity, Urine >1.046 (*)   ? Hgb urine dipstick LARGE (*)   ? Protein, ur 100 (*)   ? RBC / HPF >50 (*)   ? All other components within normal limits  ?  RESP PANEL BY RT-PCR (FLU A&B, COVID) [id number]  ?URINE CULTURE  ?LIPASE, BLOOD  ?LACTIC ACID, PLASMA  ?LACTIC ACID, PLASMA  ?TROPONIN I (HIGH SENSITIVITY)  ?TROPONIN I (HIGH SENSITIVITY)  ? ? ? ?EKG ?Normal sinus rhythm, ventricular rate 77.  PR 165, QRS 131, QTc 49.  No acute ST elevations or depressions. ? ? ?RADIOLOGY ?CT dissection: No dissection, right perinephric edema with 5 mm stone in the proximal right ureter, mild diverticulitis, ascending aortic aneurysm ? ? ?I also independently reviewed and agree wit radiologist interpretations. ? ? ?PROCEDURES: ? ?Critical Care performed: No ? ? ?MEDICATIONS ORDERED IN ED: ?Medications  ?hydrochlorothiazide (HYDRODIURIL) tablet 12.5 mg (has no administration in time range)  ?lisinopril (ZESTRIL) tablet 40 mg (has no administration in time range)  ?pravastatin (PRAVACHOL) tablet 20 mg (has no administration in time range)  ?sildenafil (REVATIO) tablet 20 mg (has no administration in time range)  ?traZODone (DESYREL) tablet 100 mg (has no administration in time range)  ?pantoprazole  (PROTONIX) EC tablet 40 mg (has no administration in time range)  ?multivitamin with minerals tablet 1 tablet (has no administration in time range)  ?0.45 % sodium chloride infusion (has no administration in time range)  ?HYDROmorphone (DILAUDID) injection 1 mg (has no administration in time range)  ?ondansetron (ZOFRAN) tablet 4 mg (has no administration in time range)  ?  Or  ?ondansetron (ZOFRAN) injection 4 mg (has no administration in time range)  ?cefTRIAXone (ROCEPHIN) 2 g in sodium chloride 0.9 % 100 mL IVPB (has no administration in time range)  ?omega-3 acid ethyl esters (LOVAZA) capsule 1 g (has no administration in time range)  ?HYDROmorphone (DILAUDID) injection 1 mg (1 mg Intravenous Given 01/07/22 0933)  ?ondansetron (ZOFRAN) injection 4 mg (4 mg Intravenous Given 01/07/22 0930)  ?iohexol (OMNIPAQUE) 350 MG/ML injection 75 mL (75 mLs Intravenous Contrast Given 01/07/22 0947)  ?sodium chloride 0.9 % bolus 500 mL (0 mLs Intravenous Stopped 01/07/22 1211)  ?HYDROmorphone (DILAUDID) injection 1 mg (1 mg Intravenous Given 01/07/22 1120)  ?HYDROmorphone (DILAUDID) injection 1 mg (1 mg Intravenous Given 01/07/22 1230)  ?cefTRIAXone (ROCEPHIN) 2 g in sodium chloride 0.9 % 100 mL IVPB (2 g Intravenous New Bag/Given 01/07/22 1338)  ? ? ? ?IMPRESSION / MDM / ASSESSMENT AND PLAN / ED COURSE  ?I reviewed the triage vital signs and the nursing notes. ?             ?               ? ?MDM:  ?76 year old male here with PMHx HTN, nephrolithiasis, GERD here with severe epigastric pain radiating to his back.  On arrival, patient notably distressed, concern for possible dissection given marked hypertension with pain concerning for this.  He has a history of prior chronic dissection of the celiac trunk as well as reported abdominal aneurysm.  Patient subsequently sent for emergent CT angio, which shows no evidence of dissection but does show 5 mm stone.  Interestingly, patient also has an ascending aortic aneurysm as well as a  read of diverticulitis though clinically this is less likely.  Patient has a history of nephrolithiasis requiring intervention for every kidney stone in the past.  Patient was given multiple doses of IV fluids and analgesia with persistent pain and nausea.  Lab work overall is reassuring.  He has a mild, likely reactive leukocytosis.  Renal function is at baseline.  UA does show hematuria as well as mild pyuria.  ? ?We will cover empirically with antibiotics,  admit for pain control.  Discussed with Dr. Bernardo Heater of urology who is in agreement.  Dr. Francine Graven of hospitalist service to admit. ? ?Of note, on review of his CT, it actually mentions an ascending aortic aneurysm.  He had a known abdominal aortic issue in the past, but I do not immediately see an issue with the ascending aorta.  Will notify hospitalist service to see if patient needs vascular follow-up for this. ? ? ?MEDICATIONS GIVEN IN ED: ?Medications  ?hydrochlorothiazide (HYDRODIURIL) tablet 12.5 mg (has no administration in time range)  ?lisinopril (ZESTRIL) tablet 40 mg (has no administration in time range)  ?pravastatin (PRAVACHOL) tablet 20 mg (has no administration in time range)  ?sildenafil (REVATIO) tablet 20 mg (has no administration in time range)  ?traZODone (DESYREL) tablet 100 mg (has no administration in time range)  ?pantoprazole (PROTONIX) EC tablet 40 mg (has no administration in time range)  ?multivitamin with minerals tablet 1 tablet (has no administration in time range)  ?0.45 % sodium chloride infusion (has no administration in time range)  ?HYDROmorphone (DILAUDID) injection 1 mg (has no administration in time range)  ?ondansetron (ZOFRAN) tablet 4 mg (has no administration in time range)  ?  Or  ?ondansetron (ZOFRAN) injection 4 mg (has no administration in time range)  ?cefTRIAXone (ROCEPHIN) 2 g in sodium chloride 0.9 % 100 mL IVPB (has no administration in time range)  ?omega-3 acid ethyl esters (LOVAZA) capsule 1 g (has no  administration in time range)  ?HYDROmorphone (DILAUDID) injection 1 mg (1 mg Intravenous Given 01/07/22 0933)  ?ondansetron (ZOFRAN) injection 4 mg (4 mg Intravenous Given 01/07/22 0930)  ?iohexol (OMNIPAQUE) 350 MG/ML

## 2022-01-08 ENCOUNTER — Observation Stay: Payer: Medicare PPO

## 2022-01-08 ENCOUNTER — Encounter
Admission: EM | Disposition: A | Discharge status: Home / Self Care | Payer: Self-pay | Source: Home / Self Care | Attending: Internal Medicine

## 2022-01-08 ENCOUNTER — Observation Stay: Payer: Medicare PPO | Admitting: Anesthesiology

## 2022-01-08 ENCOUNTER — Encounter: Payer: Self-pay | Admitting: Internal Medicine

## 2022-01-08 DIAGNOSIS — C679 Malignant neoplasm of bladder, unspecified: Secondary | ICD-10-CM | POA: Diagnosis present

## 2022-01-08 DIAGNOSIS — N23 Unspecified renal colic: Secondary | ICD-10-CM | POA: Diagnosis not present

## 2022-01-08 DIAGNOSIS — I7121 Aneurysm of the ascending aorta, without rupture: Secondary | ICD-10-CM | POA: Diagnosis present

## 2022-01-08 DIAGNOSIS — N1330 Unspecified hydronephrosis: Secondary | ICD-10-CM | POA: Diagnosis not present

## 2022-01-08 DIAGNOSIS — K5900 Constipation, unspecified: Secondary | ICD-10-CM | POA: Diagnosis present

## 2022-01-08 DIAGNOSIS — K219 Gastro-esophageal reflux disease without esophagitis: Secondary | ICD-10-CM | POA: Diagnosis present

## 2022-01-08 DIAGNOSIS — M549 Dorsalgia, unspecified: Secondary | ICD-10-CM | POA: Diagnosis present

## 2022-01-08 DIAGNOSIS — N132 Hydronephrosis with renal and ureteral calculous obstruction: Secondary | ICD-10-CM | POA: Diagnosis present

## 2022-01-08 DIAGNOSIS — G4730 Sleep apnea, unspecified: Secondary | ICD-10-CM | POA: Diagnosis present

## 2022-01-08 DIAGNOSIS — Z20822 Contact with and (suspected) exposure to covid-19: Secondary | ICD-10-CM | POA: Diagnosis present

## 2022-01-08 DIAGNOSIS — Z79891 Long term (current) use of opiate analgesic: Secondary | ICD-10-CM | POA: Diagnosis not present

## 2022-01-08 DIAGNOSIS — Z87442 Personal history of urinary calculi: Secondary | ICD-10-CM | POA: Diagnosis not present

## 2022-01-08 DIAGNOSIS — R319 Hematuria, unspecified: Secondary | ICD-10-CM | POA: Diagnosis present

## 2022-01-08 DIAGNOSIS — N201 Calculus of ureter: Secondary | ICD-10-CM | POA: Diagnosis not present

## 2022-01-08 DIAGNOSIS — K5732 Diverticulitis of large intestine without perforation or abscess without bleeding: Secondary | ICD-10-CM | POA: Diagnosis present

## 2022-01-08 DIAGNOSIS — Z7982 Long term (current) use of aspirin: Secondary | ICD-10-CM | POA: Diagnosis not present

## 2022-01-08 DIAGNOSIS — G8929 Other chronic pain: Secondary | ICD-10-CM | POA: Diagnosis present

## 2022-01-08 DIAGNOSIS — Z79899 Other long term (current) drug therapy: Secondary | ICD-10-CM | POA: Diagnosis not present

## 2022-01-08 DIAGNOSIS — Z9049 Acquired absence of other specified parts of digestive tract: Secondary | ICD-10-CM | POA: Diagnosis not present

## 2022-01-08 DIAGNOSIS — I700 Atherosclerosis of aorta: Secondary | ICD-10-CM | POA: Diagnosis present

## 2022-01-08 DIAGNOSIS — I714 Abdominal aortic aneurysm, without rupture, unspecified: Secondary | ICD-10-CM | POA: Diagnosis present

## 2022-01-08 DIAGNOSIS — N179 Acute kidney failure, unspecified: Secondary | ICD-10-CM | POA: Diagnosis present

## 2022-01-08 DIAGNOSIS — I129 Hypertensive chronic kidney disease with stage 1 through stage 4 chronic kidney disease, or unspecified chronic kidney disease: Secondary | ICD-10-CM | POA: Diagnosis present

## 2022-01-08 DIAGNOSIS — I2510 Atherosclerotic heart disease of native coronary artery without angina pectoris: Secondary | ICD-10-CM | POA: Diagnosis present

## 2022-01-08 DIAGNOSIS — N1831 Chronic kidney disease, stage 3a: Secondary | ICD-10-CM | POA: Diagnosis present

## 2022-01-08 DIAGNOSIS — H6690 Otitis media, unspecified, unspecified ear: Secondary | ICD-10-CM | POA: Diagnosis present

## 2022-01-08 DIAGNOSIS — Z8249 Family history of ischemic heart disease and other diseases of the circulatory system: Secondary | ICD-10-CM | POA: Diagnosis not present

## 2022-01-08 HISTORY — PX: CYSTOSCOPY/URETEROSCOPY/HOLMIUM LASER/STENT PLACEMENT: SHX6546

## 2022-01-08 HISTORY — PX: CYSTOSCOPY W/ RETROGRADES: SHX1426

## 2022-01-08 HISTORY — PX: CYSTOSCOPY/RETROGRADE/URETEROSCOPY/STONE EXTRACTION WITH BASKET: SHX5317

## 2022-01-08 LAB — URINE CULTURE: Culture: NO GROWTH

## 2022-01-08 LAB — RENAL FUNCTION PANEL
Albumin: 4 g/dL (ref 3.5–5.0)
Anion gap: 11 (ref 5–15)
BUN: 32 mg/dL — ABNORMAL HIGH (ref 8–23)
CO2: 26 mmol/L (ref 22–32)
Calcium: 8.8 mg/dL — ABNORMAL LOW (ref 8.9–10.3)
Chloride: 99 mmol/L (ref 98–111)
Creatinine, Ser: 1.91 mg/dL — ABNORMAL HIGH (ref 0.61–1.24)
GFR, Estimated: 36 mL/min — ABNORMAL LOW (ref >60)
Glucose, Bld: 140 mg/dL — ABNORMAL HIGH (ref 70–99)
Phosphorus: 3.2 mg/dL (ref 2.5–4.6)
Potassium: 3.7 mmol/L (ref 3.5–5.1)
Sodium: 136 mmol/L (ref 135–145)

## 2022-01-08 LAB — CBC
HCT: 39.2 % (ref 39.0–52.0)
Hemoglobin: 13.3 g/dL (ref 13.0–17.0)
MCH: 30.5 pg (ref 26.0–34.0)
MCHC: 33.9 g/dL (ref 30.0–36.0)
MCV: 89.9 fL (ref 80.0–100.0)
Platelets: 173 10*3/uL (ref 150–400)
RBC: 4.36 MIL/uL (ref 4.22–5.81)
RDW: 13.1 % (ref 11.5–15.5)
WBC: 11.9 10*3/uL — ABNORMAL HIGH (ref 4.0–10.5)
nRBC: 0 % (ref 0.0–0.2)

## 2022-01-08 SURGERY — CYSTOSCOPY/URETEROSCOPY/HOLMIUM LASER/STENT PLACEMENT
Anesthesia: General | Laterality: Right

## 2022-01-08 MED ORDER — OXYCODONE HCL 5 MG PO TABS: 5.0000 mg | ORAL_TABLET | Freq: Once | ORAL | Status: DC | PRN

## 2022-01-08 MED ORDER — PHENYLEPHRINE 80 MCG/ML (10ML) SYRINGE FOR IV PUSH (FOR BLOOD PRESSURE SUPPORT)
PREFILLED_SYRINGE | INTRAVENOUS | Status: DC | PRN
  Administered 2022-01-08 (×2): 80 ug via INTRAVENOUS

## 2022-01-08 MED ORDER — MIDAZOLAM HCL 2 MG/2ML IJ SOLN
INTRAMUSCULAR | Status: DC | PRN
  Administered 2022-01-08: 2 mg via INTRAVENOUS

## 2022-01-08 MED ORDER — HYDROMORPHONE HCL 1 MG/ML IJ SOLN: 0.5000 mg | INTRAMUSCULAR | Status: DC | PRN

## 2022-01-08 MED ORDER — METRONIDAZOLE 500 MG/100ML IV SOLN
500.0000 mg | Freq: Three times a day (TID) | INTRAVENOUS | Status: DC
  Administered 2022-01-08 – 2022-01-09 (×3): 500 mg via INTRAVENOUS
  Filled 2022-01-08 (×4): qty 100

## 2022-01-08 MED ORDER — PROPOFOL 10 MG/ML IV BOLUS
INTRAVENOUS | Status: DC | PRN
  Administered 2022-01-08: 150 mg via INTRAVENOUS

## 2022-01-08 MED ORDER — LACTATED RINGERS IV SOLN
INTRAVENOUS | Status: DC | PRN
Start: 2022-01-08 — End: 2022-01-08

## 2022-01-08 MED ORDER — DEXAMETHASONE SODIUM PHOSPHATE 10 MG/ML IJ SOLN
INTRAMUSCULAR | Status: DC | PRN
  Administered 2022-01-08: 5 mg via INTRAVENOUS

## 2022-01-08 MED ORDER — OXYCODONE HCL 5 MG/5ML PO SOLN: 5.0000 mg | Freq: Once | ORAL | Status: DC | PRN

## 2022-01-08 MED ORDER — EPHEDRINE 5 MG/ML INJ
INTRAVENOUS | Status: AC
  Filled 2022-01-08: qty 5

## 2022-01-08 MED ORDER — FENTANYL CITRATE (PF) 100 MCG/2ML IJ SOLN
INTRAMUSCULAR | Status: DC | PRN
Start: 2022-01-08 — End: 2022-01-08
  Administered 2022-01-08: 50 ug via INTRAVENOUS
  Administered 2022-01-08 (×2): 25 ug via INTRAVENOUS

## 2022-01-08 MED ORDER — PROPOFOL 10 MG/ML IV BOLUS
INTRAVENOUS | Status: AC
  Filled 2022-01-08: qty 20

## 2022-01-08 MED ORDER — FENTANYL CITRATE (PF) 100 MCG/2ML IJ SOLN
INTRAMUSCULAR | Status: AC
  Filled 2022-01-08: qty 2

## 2022-01-08 MED ORDER — LIDOCAINE HCL (CARDIAC) PF 100 MG/5ML IV SOSY
PREFILLED_SYRINGE | INTRAVENOUS | Status: DC | PRN
  Administered 2022-01-08: 100 mg via INTRAVENOUS

## 2022-01-08 MED ORDER — LIDOCAINE HCL (PF) 2 % IJ SOLN
INTRAMUSCULAR | Status: AC
  Filled 2022-01-08: qty 5

## 2022-01-08 MED ORDER — EPHEDRINE SULFATE (PRESSORS) 50 MG/ML IJ SOLN
INTRAMUSCULAR | Status: DC | PRN
  Administered 2022-01-08: 10 mg via INTRAVENOUS
  Administered 2022-01-08: 2.5 mg via INTRAVENOUS

## 2022-01-08 MED ORDER — FENTANYL CITRATE (PF) 100 MCG/2ML IJ SOLN: 25.0000 ug | INTRAMUSCULAR | Status: DC | PRN

## 2022-01-08 MED ORDER — ACETAMINOPHEN 10 MG/ML IV SOLN: 1000.0000 mg | Freq: Once | INTRAVENOUS | Status: DC | PRN

## 2022-01-08 MED ORDER — SODIUM CHLORIDE 0.9 % IV SOLN
INTRAVENOUS | Status: DC
Start: 2022-01-08 — End: 2022-01-09

## 2022-01-08 MED ORDER — POLYETHYLENE GLYCOL 3350 17 G PO PACK
17.0000 g | PACK | Freq: Two times a day (BID) | ORAL | Status: DC
  Administered 2022-01-08 – 2022-01-09 (×3): 17 g via ORAL
  Filled 2022-01-08 (×3): qty 1

## 2022-01-08 MED ORDER — DEXMEDETOMIDINE HCL IN NACL 200 MCG/50ML IV SOLN
INTRAVENOUS | Status: DC | PRN
  Administered 2022-01-08: 4 ug via INTRAVENOUS
  Administered 2022-01-08: 8 ug via INTRAVENOUS

## 2022-01-08 MED ORDER — SENNOSIDES-DOCUSATE SODIUM 8.6-50 MG PO TABS
1.0000 | ORAL_TABLET | Freq: Two times a day (BID) | ORAL | Status: DC
  Administered 2022-01-08 – 2022-01-09 (×3): 1 via ORAL
  Filled 2022-01-08 (×3): qty 1

## 2022-01-08 MED ORDER — IOHEXOL 180 MG/ML  SOLN
INTRAMUSCULAR | Status: DC | PRN
  Administered 2022-01-08: 10 mL

## 2022-01-08 MED ORDER — PHENYLEPHRINE 80 MCG/ML (10ML) SYRINGE FOR IV PUSH (FOR BLOOD PRESSURE SUPPORT)
PREFILLED_SYRINGE | INTRAVENOUS | Status: AC
  Filled 2022-01-08: qty 10

## 2022-01-08 MED ORDER — SODIUM CHLORIDE 0.9 % IR SOLN
Status: DC | PRN
Start: 2022-01-08 — End: 2022-01-08
  Administered 2022-01-08: 3000 mL via INTRAVESICAL

## 2022-01-08 MED ORDER — DEXAMETHASONE SODIUM PHOSPHATE 10 MG/ML IJ SOLN
INTRAMUSCULAR | Status: AC
  Filled 2022-01-08: qty 1

## 2022-01-08 MED ORDER — ONDANSETRON HCL 4 MG/2ML IJ SOLN
INTRAMUSCULAR | Status: AC
  Filled 2022-01-08: qty 2

## 2022-01-08 MED ORDER — MIDAZOLAM HCL 2 MG/2ML IJ SOLN
INTRAMUSCULAR | Status: AC
  Filled 2022-01-08: qty 2

## 2022-01-08 MED ORDER — LACTATED RINGERS IV SOLN: INTRAVENOUS | Status: DC

## 2022-01-08 SURGICAL SUPPLY — 27 items
BAG DRAIN CYSTO-URO LG1000N (MISCELLANEOUS) ×3 IMPLANT
BASKET LASER NITINOL 1.9FR (BASKET) ×1 IMPLANT
BASKET ZERO TIP 1.9FR (BASKET) IMPLANT
BRUSH SCRUB EZ 1% IODOPHOR (MISCELLANEOUS) ×3 IMPLANT
CATH ROBINSON RED A/P 16FR (CATHETERS) ×1 IMPLANT
CATH URET FLEX-TIP 2 LUMEN 10F (CATHETERS) IMPLANT
CATH URETL OPEN END 6X70 (CATHETERS) ×1 IMPLANT
CNTNR SPEC 2.5X3XGRAD LEK (MISCELLANEOUS) ×4
CONT SPEC 4OZ STER OR WHT (MISCELLANEOUS) ×2
CONTAINER SPEC 2.5X3XGRAD LEK (MISCELLANEOUS) IMPLANT
DRAPE UTILITY 15X26 TOWEL STRL (DRAPES) ×3 IMPLANT
GLOVE SURG UNDER POLY LF SZ7.5 (GLOVE) ×3 IMPLANT
GOWN STRL REUS W/ TWL LRG LVL3 (GOWN DISPOSABLE) ×2 IMPLANT
GOWN STRL REUS W/ TWL XL LVL3 (GOWN DISPOSABLE) ×2 IMPLANT
GOWN STRL REUS W/TWL LRG LVL3 (GOWN DISPOSABLE) ×1
GOWN STRL REUS W/TWL XL LVL3 (GOWN DISPOSABLE) ×1
GUIDEWIRE STR DUAL SENSOR (WIRE) ×4 IMPLANT
IV NS IRRIG 3000ML ARTHROMATIC (IV SOLUTION) ×3 IMPLANT
KIT TURNOVER CYSTO (KITS) ×3 IMPLANT
PACK CYSTO AR (MISCELLANEOUS) ×3 IMPLANT
SET CYSTO W/LG BORE CLAMP LF (SET/KITS/TRAYS/PACK) ×3 IMPLANT
SHEATH URETERAL 12FRX35CM (MISCELLANEOUS) IMPLANT
STENT URET 6FRX26 CONTOUR (STENTS) IMPLANT
SURGILUBE 2OZ TUBE FLIPTOP (MISCELLANEOUS) ×3 IMPLANT
TRACTIP FLEXIVA PULSE ID 200 (Laser) ×3 IMPLANT
VALVE UROSEAL ADJ ENDO (VALVE) ×1 IMPLANT
WATER STERILE IRR 500ML POUR (IV SOLUTION) ×2 IMPLANT

## 2022-01-08 NOTE — Op Note (Signed)
Preoperative diagnosis: ?Right proximal ureteral calculus ?Renal colic secondary to above ? ?Postoperative diagnosis:  ?Right proximal ureteral calculus ?Recurrent multifocal bladder tumor ? ?Procedure: ? ?Cystoscopy ?Right ureteroscopy and stone removal ?Ureteroscopic laser lithotripsy ?Right ureteral stent placement (20F/26 cm)  ?Right retrograde pyelography with interpretation ?Bladder biopsies with fulguration ? ?Surgeon: Ronda Fairly. Kerington Hildebrant, M.D. ? ?Anesthesia: General ? ?Complications: None ? ?Intraoperative findings:  ?Cystoscopy-urethra normal in caliber without stricture.  Mild-moderate lateral lobe enlargement prostate with moderate bladder neck elevation.  Papillary tumors: Right bladder base inferior to UO ~ 5 mm; medial right UO ~ 3 mm; left posterior wall inferiorly ~ 10 mm; left bladder base inferior to left UO ~ 3 mm ?Ureteropyeloscopy-inflammatory change right proximal ureter at previous site of stone.  Calculus dislodged by ureteral catheter and located in an upper pole calyx.  ?Right retrograde pyelogram- Contrast was still present right collecting system from yesterday CT with contrast with moderate hydronephrosis.  Postprocedural retrograde pyelogram negative for extravasation or significantly sized stone fragments ? ?EBL: Minimal ? ?Specimens: ?Calculus fragments for analysis ?Bladder tumor ?Urine from the right renal pelvis for culture ? ? ?Indication: Andre Stone is a 76 y.o. admitted 01/07/2022 with intractable renal colic secondary to a 4 mm right proximal ureteral calculus. After reviewing the management options for treatment, the patient elected to proceed with the above surgical procedure(s). We have discussed the potential benefits and risks of the procedure, side effects of the proposed treatment, the likelihood of the patient achieving the goals of the procedure, and any potential problems that might occur during the procedure or recuperation. Informed consent has been  obtained. ? ?Description of procedure: ? ?The patient was taken to the operating room and general anesthesia was induced.  The patient was placed in the dorsal lithotomy position, prepped and draped in the usual sterile fashion, and preoperative antibiotics were administered. A preoperative time-out was performed.  ? ?A 21 French cystoscope was lubricated, passed per urethra and advanced proximally under direct vision with findings as described above.   ? ?Attention was directed to the right ureteral orifice and a 0.038 Sensor wire was then advanced up the ureter into the renal pelvis under fluoroscopic guidance.   ? ?A 5 French open ended ureteral catheter was placed over the guidewire and advanced to the region of the renal pelvis.  10 cc of clear urine was aspirated and sent for culture.  The guidewire was replaced and the ureteral catheter was removed. ? ?A 4.5 French semirigid ureteroscope was then placed .  The right ureteral orifice was easily engaged and the ureteroscope was advanced proximally however due to anatomy could not be advanced beyond the mid ureter.  The ureteroscope was removed after placing a second sensor wire. ? ?A single channel digital flexible ureteroscope was advanced over the working guidewire.  The ureteroscope was unable to be advanced through the midportion of the distal ureter.  The Sensor wire was replaced with a Super Stiff wire and ureteroscope was advanced without difficulty.  A slightly narrowed area in the proximal ureter with erythema was identified at the prior stone site however the stone is not of an identified.  The ureteroscope was then advanced into the renal pelvis and the calculus was identified in an upper pole calyx. ? ?A 243 ?m holmium laser fiber was placed through the ureteroscope and the stone was dusted at a setting of 0.3J/40 Hz.  A few fragments larger than 1 mm were chipped off the stone and further  treated with noncontact laser lithotripsy at 0.6J/40 Hz.   One 3 mm fragment was identified and placed in a 1.9 [place] nitinol basket, removed and sent for analysis. ? ?The ureteroscope was repassed and retrograde pyelogram was performed through the ureteroscope with findings as described above.  Each calyx was examined and no significantly sized stone fragments were identified. ? ?The ureteroscope was removed under direct vision and no ureteral fragments were identified. ? ?A 36F/26 cm Contour ureteral stent was placed on fluoroscopic guidance with a good curl seen in the renal pelvis and bladder under fluoroscopy. ? ?The cystoscope was repassed.  The 5 and 10 mm papillary tumors were removed with cold cup biopsy forceps.  The biopsy sites were then fulgurated with a Bugbee electrode.  The smaller papillary tumors were also fulgurated.  Hemostasis was noted to be adequate.  No additional tumors were identified.  The distal end of the stent was in good position. ? ?The bladder was then emptied and the procedure ended.  The patient appeared to tolerate the procedure well and without complications.  After anesthetic reversal the patient was transported to the PACU in stable condition.  ? ?Recommendation: ?Okay for discharge mid afternoon if feeling well and criteria met ?Ureteral stent was left attached to a tether and patient may remove on Sunday, 01/13/2022 ?Tamsulosin 0.4 mg daily and oxybutynin IR 5 mg every 8 hours prn bladder spasm/stent irritation ?Postop follow-up will be scheduled 3-4 weeks ? ? ?Andre Giovanni, MD ?

## 2022-01-08 NOTE — Discharge Instructions (Signed)
DISCHARGE INSTRUCTIONS FOR KIDNEY STONE/URETERAL STENT  ? ?MEDICATIONS:  ?1. Resume all your other meds from home.  ?2.  AZO (over-the-counter) can help with the burning/stinging when you urinate. ? ? ?ACTIVITY:  ?1. May resume regular activities in 24 hours. ?2. No driving while on narcotic pain medications  ?3. Drink plenty of water  ?4. Continue to walk at home - you can still get blood clots when you are at home, so keep active, but don't over do it.  ?5. May return to work/school tomorrow or when you feel ready  ? ?BATHING:  ?1. You can shower. ?2. You have a string coming from your urethra: The stent string is attached to your ureteral stent. Do not pull on this.  ? ?SIGNS/SYMPTOMS TO CALL:  ?Common postoperative symptoms include urinary frequency, urgency, bladder spasm and blood in the urine ? ?Please call us if you have a fever greater than 101.5, uncontrolled nausea/vomiting, uncontrolled pain, dizziness, unable to urinate, excessively bloody urine, chest pain, shortness of breath, leg swelling, leg pain, or any other concerns or questions.  ? ?You can reach [place] at [phone].  ? ?FOLLOW-UP:  ?1. You we will be contacted by our office for a follow-up appointment ?2. You have a string attached to your stent, you may remove it on Sunday, 01/13/2022. To do this, pull the string until the stent is completely removed. You may feel an odd sensation in your back. ? ?

## 2022-01-08 NOTE — Anesthesia Preprocedure Evaluation (Addendum)
Anesthesia Evaluation  ?Patient identified by MRN, date of birth, ID band ?Patient awake ? ? ? ?Reviewed: ?Allergy & Precautions, NPO status , Patient's Chart, lab work & pertinent test results ? ?History of Anesthesia Complications ?Negative for: history of anesthetic complications ? ?Airway ?Mallampati: II ? ? ?Neck ROM: Full ? ? ? Dental ? ?(+) Missing ?  ?Pulmonary ?sleep apnea ,  ?  ?Pulmonary exam normal ?breath sounds clear to auscultation ? ? ? ? ? ? Cardiovascular ?hypertension, Normal cardiovascular exam ?Rhythm:Regular Rate:Normal ? ?ECG 01/07/21:  ?Sinus rhythm ?IVCD, consider atypical RBBB ? ?Myocardial perfusion 10/03/17:  ?LVEF= 62%  ?Regional wall motion:??reveals normal myocardial thickening and wall motion.  ?The overall quality of the study is excellent.??  ?Artifacts noted: no  ?Left ventricular cavity: normal.  ?Perfusion Analysis:??SPECT images demonstrate homogeneous tracer distribution throughout the myocardium.?? ?  ?Neuro/Psych ?negative neurological ROS ?   ? GI/Hepatic ?GERD  ,Diverticulitis  ?  ?Endo/Other  ?Prediabetes  ? Renal/GU ?Renal disease (nephrolithiasis)  ? ?  ?Musculoskeletal ? ? Abdominal ?  ?Peds ? Hematology ?negative hematology ROS ?(+)   ?Anesthesia Other Findings ? ? Reproductive/Obstetrics ? ?  ? ? ? ? ? ? ? ? ? ? ? ? ? ?  ?  ? ? ? ? ? ? ? ?Anesthesia Physical ?Anesthesia Plan ? ?ASA: 3 ? ?Anesthesia Plan: General  ? ?Post-op Pain Management:   ? ?Induction: Intravenous ? ?PONV Risk Score and Plan: 2 and Ondansetron, Dexamethasone and Treatment may vary due to age or medical condition ? ?Airway Management Planned: LMA ? ?Additional Equipment:  ? ?Intra-op Plan:  ? ?Post-operative Plan: Extubation in OR ? ?Informed Consent: I have reviewed the patients History and Physical, chart, labs and discussed the procedure including the risks, benefits and alternatives for the proposed anesthesia with the patient or authorized representative who has  indicated his/her understanding and acceptance.  ? ? ? ?Dental advisory given ? ?Plan Discussed with: CRNA ? ?Anesthesia Plan Comments: (Patient consented for risks of anesthesia including but not limited to:  ?- adverse reactions to medications ?- damage to eyes, teeth, lips or other oral mucosa ?- nerve damage due to positioning  ?- sore throat or hoarseness ?- damage to heart, brain, nerves, lungs, other parts of body or loss of life ? ?Informed patient about role of CRNA in peri- and intra-operative care.  Patient voiced understanding.)  ? ? ? ? ? ? ?Anesthesia Quick Evaluation ? ?

## 2022-01-08 NOTE — Anesthesia Procedure Notes (Signed)
Procedure Name: LMA Insertion ?Date/Time: 01/08/2022 11:00 AM ?Performed by: Cammie Sickle, CRNA ?Pre-anesthesia Checklist: Patient identified, Patient being monitored, Timeout performed, Emergency Drugs available and Suction available ?Patient Re-evaluated:Patient Re-evaluated prior to induction ?Oxygen Delivery Method: Circle system utilized ?Preoxygenation: Pre-oxygenation with 100% oxygen ?Induction Type: IV induction ?Ventilation: Mask ventilation without difficulty ?LMA: LMA inserted ?LMA Size: 4.0 ?Tube type: Oral ?Number of attempts: 1 ?Placement Confirmation: positive ETCO2 and breath sounds checked- equal and bilateral ?Tube secured with: Tape ?Dental Injury: Teeth and Oropharynx as per pre-operative assessment  ? ? ? ? ?

## 2022-01-08 NOTE — Progress Notes (Signed)
?PROGRESS NOTE ? ? ? ?Andre Stone  GNO:[id number] DOB: 1946-08-02 DOA: 01/07/2022 ?PCP: Dion Body, MD  ? ? ?Brief Narrative:  ?Andre Stone is a 76 y.o. male with medical history significant for nephrolithiasis status post lithotripsy, hypertension, GERD, sleep apnea who presents to the ER for evaluation of abdominal pain. ?Patient states that his symptoms started 1 day prior to presentation after dinner last night and was mostly periumbilical.  He rated his pain a 5 x 10 in intensity at its worst.  He remained a constant discomfort when he went to bed and he awoke at about 4 AM this morning with severe pain in his lower back rated a 9 x 10 in intensity at its worst.  Pain was nonradiating and was associated with nausea and multiple episodes of emesis.  She was unable to keep any liquid.  He denies having any fever or chills and denies having any frequency of urination or dysuria. ?His wife had administered an oral antiemetic at home which he was unable to keep down and so he presented to the ER for further evaluation. ? ?Ct found with 77m stone rt ureter. Mild diverticulitis. Urology consulted, went to [facility] for [facility] procedure. ? ? ?Consultants:  ?urology ? ?Procedures:  ? ?Antimicrobials:  ?rocephine  ? ? ?Subjective: ?Has dysuria and difficulty urinating. No sob. Also constipated. No abd pain, n/v ? ?Objective: ?Vitals:  ? 01/07/22 1719 01/07/22 2105 01/08/22 0350 01/08/22 0809  ?BP: (!) 163/100 (!) 152/96 137/83 138/84  ?Pulse: 73 80 84 (!) 104  ?Resp: '18 20 20 19  '$ ?Temp: 98.1 ?F (36.7 ?C) 99.1 ?F (37.3 ?C) 98.9 ?F (37.2 ?C) 98.3 ?F (36.8 ?C)  ?TempSrc: Oral Oral Oral Oral  ?SpO2: 98% 95% 96% 95%  ?Weight:      ?Height:      ? ? ?Intake/Output Summary (Last 24 hours) at 01/08/2022 0830 ?Last data filed at 01/08/2022 01694?Gross per 24 hour  ?Intake 768.39 ml  ?Output 430 ml  ?Net 338.39 ml  ? ?Filed Weights  ? 01/07/22 0857  ?Weight: 90.3 kg  ? ? ?Examination: ?Calm, NAD ?Cta no w/r ?Reg  s1/s2 no gallop ?Soft benign +bs ?No edema ?Aaoxox3  ?Mood and affect appropriate in current setting  ? ? ? ?Data Reviewed: I have personally reviewed following labs and imaging studies ? ?CBC: ?Recent Labs  ?Lab 01/07/22 ?0921 01/08/22 ?0354  ?WBC 10.6* 11.9*  ?NEUTROABS 8.9*  --   ?HGB 14.4 13.3  ?HCT 43.3 39.2  ?MCV 89.5 89.9  ?PLT 173 173  ? ?Basic Metabolic Panel: ?Recent Labs  ?Lab 01/07/22 ?0921  ?NA 138  ?K 3.9  ?CL 103  ?CO2 25  ?GLUCOSE 158*  ?BUN 33*  ?CREATININE 1.69*  ?CALCIUM 9.1  ? ?GFR: ?Estimated Creatinine Clearance: 41.5 mL/min (A) (by C-G formula based on SCr of 1.69 mg/dL (H)). ?Liver Function Tests: ?Recent Labs  ?Lab 01/07/22 ?0921  ?AST 26  ?ALT 26  ?ALKPHOS 63  ?BILITOT 0.6  ?PROT 8.1  ?ALBUMIN 4.3  ? ?Recent Labs  ?Lab 01/07/22 ?0921  ?LIPASE 47  ? ?No results for input(s): AMMONIA in the last 168 hours. ?Coagulation Profile: ?No results for input(s): INR, PROTIME in the last 168 hours. ?Cardiac Enzymes: ?No results for input(s): CKTOTAL, CKMB, CKMBINDEX, TROPONINI in the last 168 hours. ?BNP (last 3 results) ?No results for input(s): PROBNP in the last 8760 hours. ?HbA1C: ?No results for input(s): HGBA1C in the last 72 hours. ?CBG: ?No results for input(s): GLUCAP  in the last 168 hours. ?Lipid Profile: ?No results for input(s): CHOL, HDL, LDLCALC, TRIG, CHOLHDL, LDLDIRECT in the last 72 hours. ?Thyroid Function Tests: ?No results for input(s): TSH, T4TOTAL, FREET4, T3FREE, THYROIDAB in the last 72 hours. ?Anemia Panel: ?No results for input(s): VITAMINB12, FOLATE, FERRITIN, TIBC, IRON, RETICCTPCT in the last 72 hours. ?Sepsis Labs: ?Recent Labs  ?Lab 01/07/22 ?0921 01/07/22 ?1124  ?LATICACIDVEN 1.9 1.6  ? ? ?Recent Results (from the past 240 hour(s))  ?Resp Panel by RT-PCR (Flu A&B, Covid) Nasopharyngeal Swab     Status: None  ? Collection Time: 01/07/22  9:21 AM  ? Specimen: Nasopharyngeal Swab; Nasopharyngeal(NP) swabs in vial transport medium  ?Result Value Ref Range Status  ? SARS  Coronavirus 2 by RT PCR NEGATIVE NEGATIVE Final  ?  Comment: (NOTE) ?SARS-CoV-2 target nucleic acids are NOT DETECTED. ? ?The SARS-CoV-2 RNA is generally detectable in upper respiratory ?specimens during the acute phase of infection. The lowest ?concentration of SARS-CoV-2 viral copies this assay can detect is ?138 copies/mL. A negative result does not preclude SARS-Cov-2 ?infection and should not be used as the sole basis for treatment or ?other patient management decisions. A negative result may occur with  ?improper specimen collection/handling, submission of specimen other ?than nasopharyngeal swab, presence of viral mutation(s) within the ?areas targeted by this assay, and inadequate number of viral ?copies(<138 copies/mL). A negative result must be combined with ?clinical observations, patient history, and epidemiological ?information. The expected result is Negative. ? ?Fact Sheet for Patients:  ?EntrepreneurPulse.com.au ? ?Fact Sheet for Healthcare Providers:  ?IncredibleEmployment.be ? ?This test is no t yet approved or cleared by the [place] FDA and  ?has been authorized for detection and/or diagnosis of SARS-CoV-2 by ?FDA under an Emergency Use Authorization (EUA). This EUA will remain  ?in effect (meaning this test can be used) for the duration of the ?COVID-19 declaration under Section 564(b)(1) of the Act, 21 ?U.S.C.section 360bbb-3(b)(1), unless the authorization is terminated  ?or revoked sooner.  ? ? ?  ? Influenza A by PCR NEGATIVE NEGATIVE Final  ? Influenza B by PCR NEGATIVE NEGATIVE Final  ?  Comment: (NOTE) ?The Xpert Xpress SARS-CoV-2/FLU/RSV plus assay is intended as an aid ?in the diagnosis of influenza from Nasopharyngeal swab specimens and ?should not be used as a sole basis for treatment. Nasal washings and ?aspirates are unacceptable for Xpert Xpress SARS-CoV-2/FLU/RSV ?testing. ? ?Fact Sheet for  Patients: ?EntrepreneurPulse.com.au ? ?Fact Sheet for Healthcare Providers: ?IncredibleEmployment.be ? ?This test is not yet approved or cleared by the [place] FDA and ?has been authorized for detection and/or diagnosis of SARS-CoV-2 by ?FDA under an Emergency Use Authorization (EUA). This EUA will remain ?in effect (meaning this test can be used) for the duration of the ?COVID-19 declaration under Section 564(b)(1) of the Act, 21 U.S.C. ?section 360bbb-3(b)(1), unless the authorization is terminated or ?revoked. ? ?Performed at [facility], [place], ?[place] [address] ?  ?  ? ? ? ? ? ?Radiology Studies: ?CT Angio Chest/Abd/Pel for Dissection W and/or Wo Contrast ? ?Result Date: 01/07/2022 ?CLINICAL DATA:  Acute aortic syndrome suspected. EXAM: CT ANGIOGRAPHY CHEST, ABDOMEN AND PELVIS TECHNIQUE: Non-contrast CT of the chest was initially obtained. Multidetector CT imaging through the chest, abdomen and pelvis was performed using the standard protocol during bolus administration of intravenous contrast. Multiplanar reconstructed images and MIPs were obtained and reviewed to evaluate the vascular anatomy. RADIATION DOSE REDUCTION: This exam was performed according to the departmental dose-optimization program  which includes automated exposure control, adjustment of the mA and/or kV according to patient size and/or use of iterative reconstruction technique. CONTRAST:  89m OMNIPAQUE IOHEXOL 350 MG/ML SOLN COMPARISON:  CT abdomen pelvis 11/02/2019 and CT chest 12/25/2015. FINDINGS: CTA CHEST FINDINGS Cardiovascular: Atherosclerotic calcification of the aorta. No evidence of an intramural hematoma or dissection flap. Ascending aorta measures 4.1 cm, similar. Atherosclerotic calcification of the aortic valve and coronary arteries. Heart size normal. No pericardial effusion. Mediastinum/Nodes: No pathologically enlarged mediastinal, hilar or axillary lymph  nodes. Esophagus is unremarkable. Lungs/Pleura: Minimal dependent atelectasis. No suspicious pulmonary nodules. No pleural fluid. Airway is unremarkable. Musculoskeletal: Degenerative changes in the spine. No worrisome lytic or sclerotic lesions. Review of t

## 2022-01-08 NOTE — Interval H&P Note (Signed)
History and Physical Interval Note: ? ?01/08/2022 ?10:42 AM ? ?Andre Stone  has presented today for surgery, with the diagnosis of Right Ureteral Stone and Cystoscopy.  The various methods of treatment have been discussed with the patient and family. After consideration of risks, benefits and other options for treatment, the patient has consented to  Procedure(s): ?CYSTOSCOPY/URETEROSCOPY/HOLMIUM LASER/STENT PLACEMENT (Right) as a surgical intervention.  The patient's history has been reviewed, patient examined, no change in status, stable for surgery.  I have reviewed the patient's chart and labs.  Questions were answered to the patient's satisfaction.   ? ? ?Leanne Sisler C Danecia Underdown ? ? ?

## 2022-01-08 NOTE — Anesthesia Postprocedure Evaluation (Signed)
Anesthesia Post Note ? ?Patient: Andre Stone ? ?Procedure(s) Performed: CYSTOSCOPY/URETEROSCOPY/HOLMIUM LASER/STENT PLACEMENT (Right) ?CYSTOSCOPY WITH RETROGRADE PYELOGRAM/ BLADDER BIOPSY ?CYSTOSCOPY/RETROGRADE/URETEROSCOPY/STONE EXTRACTION WITH BASKET (Right) ? ?Patient location during evaluation: PACU ?Anesthesia Type: General ?Level of consciousness: awake and alert, oriented and patient cooperative ?Pain management: pain level controlled ?Vital Signs Assessment: post-procedure vital signs reviewed and stable ?Respiratory status: spontaneous breathing, nonlabored ventilation and respiratory function stable ?Cardiovascular status: blood pressure returned to baseline and stable ?Postop Assessment: adequate PO intake ?Anesthetic complications: no ? ? ?No notable events documented. ? ? ?Last Vitals:  ?Vitals:  ? 01/08/22 1300 01/08/22 1315  ?BP: (!) 121/91 (!) 131/92  ?Pulse: 86 78  ?Resp: 15 17  ?Temp:    ?SpO2: 92% 93%  ?  ?Last Pain:  ?Vitals:  ? 01/08/22 1315  ?TempSrc:   ?PainSc: 0-No pain  ? ? ?  ?  ?  ?  ?  ?  ? ?Darrin Nipper ? ? ? ? ?

## 2022-01-08 NOTE — Transfer of Care (Signed)
Immediate Anesthesia Transfer of Care Note ? ?Patient: Andre Stone ? ?Procedure(s) Performed: CYSTOSCOPY/URETEROSCOPY/HOLMIUM LASER/STENT PLACEMENT (Right) ?CYSTOSCOPY WITH RETROGRADE PYELOGRAM/ BLADDER BIOPSY ?CYSTOSCOPY/RETROGRADE/URETEROSCOPY/STONE EXTRACTION WITH BASKET (Right) ? ?Patient Location: PACU ? ?Anesthesia Type:General ? ?Level of Consciousness: drowsy ? ?Airway & Oxygen Therapy: Patient Spontanous Breathing and Patient connected to face mask oxygen ? ?Post-op Assessment: Report given to RN and Post -op Vital signs reviewed and stable ? ?Post vital signs: Reviewed and stable ? ?Last Vitals:  ?Vitals Value Taken Time  ?BP 125/73 01/08/22 1249  ?Temp 36.1 01/08/22 1249  ?Pulse 90 01/08/22 1249  ?Resp 12 01/08/22 1249  ?SpO2 99 % 01/08/22 1249  ?Vitals shown include unvalidated device data. ? ?Last Pain:  ?Vitals:  ? 01/08/22 0900  ?TempSrc: Oral  ?PainSc:   ?   ? ?Patients Stated Pain Goal: 0 (01/08/22 0723) ? ?Complications: No notable events documented. ?

## 2022-01-09 DIAGNOSIS — N1330 Unspecified hydronephrosis: Secondary | ICD-10-CM | POA: Diagnosis not present

## 2022-01-09 DIAGNOSIS — N23 Unspecified renal colic: Secondary | ICD-10-CM

## 2022-01-09 DIAGNOSIS — N201 Calculus of ureter: Secondary | ICD-10-CM | POA: Diagnosis not present

## 2022-01-09 DIAGNOSIS — C679 Malignant neoplasm of bladder, unspecified: Principal | ICD-10-CM

## 2022-01-09 LAB — BASIC METABOLIC PANEL
Anion gap: 6 (ref 5–15)
BUN: 29 mg/dL — ABNORMAL HIGH (ref 8–23)
CO2: 25 mmol/L (ref 22–32)
Calcium: 8.2 mg/dL — ABNORMAL LOW (ref 8.9–10.3)
Chloride: 104 mmol/L (ref 98–111)
Creatinine, Ser: 1.41 mg/dL — ABNORMAL HIGH (ref 0.61–1.24)
GFR, Estimated: 52 mL/min — ABNORMAL LOW (ref >60)
Glucose, Bld: 106 mg/dL — ABNORMAL HIGH (ref 70–99)
Potassium: 3.7 mmol/L (ref 3.5–5.1)
Sodium: 135 mmol/L (ref 135–145)

## 2022-01-09 LAB — CBC
HCT: 35.2 % — ABNORMAL LOW (ref 39.0–52.0)
Hemoglobin: 12 g/dL — ABNORMAL LOW (ref 13.0–17.0)
MCH: 30.5 pg (ref 26.0–34.0)
MCHC: 34.1 g/dL (ref 30.0–36.0)
MCV: 89.6 fL (ref 80.0–100.0)
Platelets: 158 10*3/uL (ref 150–400)
RBC: 3.93 MIL/uL — ABNORMAL LOW (ref 4.22–5.81)
RDW: 13.2 % (ref 11.5–15.5)
WBC: 9.7 10*3/uL (ref 4.0–10.5)
nRBC: 0 % (ref 0.0–0.2)

## 2022-01-09 LAB — URINE CULTURE: Culture: NO GROWTH

## 2022-01-09 LAB — SURGICAL PATHOLOGY

## 2022-01-09 MED ORDER — TAMSULOSIN HCL 0.4 MG PO CAPS: 0.4000 mg | ORAL_CAPSULE | Freq: Every day | ORAL | 0 refills | Status: AC

## 2022-01-09 MED ORDER — OXYCODONE HCL 5 MG PO TABS: 5.0000 mg | ORAL_TABLET | Freq: Three times a day (TID) | ORAL | 0 refills | Status: DC | PRN

## 2022-01-09 MED ORDER — CIPROFLOXACIN HCL 500 MG PO TABS: 500.0000 mg | ORAL_TABLET | Freq: Two times a day (BID) | ORAL | 0 refills | Status: AC

## 2022-01-09 MED ORDER — OXYBUTYNIN CHLORIDE 5 MG PO TABS
5.0000 mg | ORAL_TABLET | Freq: Three times a day (TID) | ORAL | 0 refills | Status: DC
Start: 2022-01-09 — End: 2022-02-28

## 2022-01-09 NOTE — Plan of Care (Signed)
?  Problem: Education: ?Goal: Knowledge of General Education information will improve ?Description: Including pain rating scale, medication(s)/side effects and non-pharmacologic comfort measures ?01/09/2022 1223 by Evelena Peat, RN ?Outcome: Completed/Met ?01/09/2022 0908 by Evelena Peat, RN ?Outcome: Progressing ?  ?Problem: Health Behavior/Discharge Planning: ?Goal: Ability to manage health-related needs will improve ?01/09/2022 1223 by Evelena Peat, RN ?Outcome: Completed/Met ?01/09/2022 0908 by Evelena Peat, RN ?Outcome: Progressing ?  ?Problem: Clinical Measurements: ?Goal: Ability to maintain clinical measurements within normal limits will improve ?01/09/2022 1223 by Evelena Peat, RN ?Outcome: Completed/Met ?01/09/2022 0908 by Evelena Peat, RN ?Outcome: Progressing ?Goal: Will remain free from infection ?01/09/2022 1223 by Evelena Peat, RN ?Outcome: Completed/Met ?01/09/2022 0908 by Evelena Peat, RN ?Outcome: Progressing ?Goal: Diagnostic test results will improve ?01/09/2022 1223 by Evelena Peat, RN ?Outcome: Completed/Met ?01/09/2022 0908 by Evelena Peat, RN ?Outcome: Progressing ?Goal: Respiratory complications will improve ?01/09/2022 1223 by Evelena Peat, RN ?Outcome: Completed/Met ?01/09/2022 0908 by Evelena Peat, RN ?Outcome: Progressing ?Goal: Cardiovascular complication will be avoided ?01/09/2022 1223 by Evelena Peat, RN ?Outcome: Completed/Met ?01/09/2022 0908 by Evelena Peat, RN ?Outcome: Progressing ?  ?

## 2022-01-09 NOTE — Discharge Summary (Signed)
Physician Discharge Summary  ?QUADE RAMIREZ ZOX:[id number] DOB: 29-May-1946 DOA: 01/07/2022 ? ?PCP: Dion Body, MD ? ?Admit date: 01/07/2022 ?Discharge date: 01/09/2022 ? ?Admitted From: Home ?Disposition: Home ? ?Recommendations for Outpatient Follow-up:  ?Follow up with PCP in 1-2 weeks ?Follow-up with urology as directed ? ?Home Health: No ?Equipment/Devices: None ? ?Discharge Condition: Stable ?CODE STATUS: Full ?Diet recommendation: Regular ? ?Brief/Interim Summary: ? ?76 y.o. male with medical history significant for nephrolithiasis status post lithotripsy, hypertension, GERD, sleep apnea who presents to the ER for evaluation of abdominal pain. ?Patient states that his symptoms started 1 day prior to presentation after dinner last night and was mostly periumbilical.  He rated his pain a 5 x 10 in intensity at its worst.  He remained a constant discomfort when he went to bed and he awoke at about 4 AM this morning with severe pain in his lower back rated a 9 x 10 in intensity at its worst.  Pain was nonradiating and was associated with nausea and multiple episodes of emesis.  She was unable to keep any liquid.  He denies having any fever or chills and denies having any frequency of urination or dysuria. ?His wife had administered an oral antiemetic at home which he was unable to keep down and so he presented to the ER for further evaluation. ?  ?Ct found with 38m stone rt ureter. Mild diverticulitis. Urology consulted, went to [facility] for [facility] procedure.  Patient status post cystoscopy with right ureteroscopy and stone removal as well as right ureteral stent placement and laser lithotripsy.  Tolerated procedure well.  Had some immediate postoperative pain however this was resolved at time of discharge.  Seen by urology on the day of discharge.  Cleared for discharge. ? ?At time of discharge will recommend Flomax 0.4 mg daily, oxybutynin 5 mg 3 times daily as needed.  Will also prescribe a small amount  of oxycodone for breakthrough pain.  Follow-up outpatient PCP and urology.  Urology follow-up 3 to 4 weeks. ? ? ?Discharge Diagnoses:  ?Principal Problem: ?  Hydronephrosis, right ?Active Problems: ?  Diverticulitis large intestine w/o perforation or abscess w/o bleeding ?  Essential (primary) hypertension ?  Gastro-esophageal reflux disease without esophagitis ? ?Hydronephrosis, right ?Patient presents for evaluation of severe low back pain and is noted to have right perinephric edema with moderate to severe right ?hydronephrosis secondary to a 5 mm stone in the proximal right ?Ureter. ?4/18 urology was consulted.  Patient on went to the OR is status post cystoscopy and stent placement. ?Tolerated procedure well.  Pain well controlled at time of discharge.  Will discharge home with Flomax 0.4 mg daily, oxybutynin 5 mg 3 times daily as needed, oxycodone 5 mg every 6 hours as needed.  Empiric ciprofloxacin for intra-abdominal coverage as well as empiric coverage for urinary tract infection. ?Continue ceftriaxone ?Follow-up urine culture ?  ?AKI on CKD stage 3a ?Urinary Retention ?[place] due to stone ?Improving at time of discharge ?Outpatient PCP follow-up ?  ?  ?Diverticulitis large intestine w/o perforation or abscess w/o bleeding ?Found on CT scan ?Tolerating p.o. diet ?P.o. ciprofloxacin as above ?  ? ?Discharge Instructions ? ?Discharge Instructions   ? ? Diet - low sodium heart healthy   Complete by: As directed ?  ? Increase activity slowly   Complete by: As directed ?  ? No wound care   Complete by: As directed ?  ? ?  ? ? ? ? ? ? ?Consultations: ?Urology ? ? ?  Procedures/Studies: ?[facility] UROLOGY CYSTO IMAGE ([place]) ? ?Result Date: 01/08/2022 ?There is no interpretation for this exam.  This order is for images obtained during a surgical procedure.  Please See "Surgeries" Tab for more information regarding the procedure.  ? ?CT Angio Chest/Abd/Pel for Dissection W and/or Wo Contrast ? ?Result Date:  01/07/2022 ?CLINICAL DATA:  Acute aortic syndrome suspected. EXAM: CT ANGIOGRAPHY CHEST, ABDOMEN AND PELVIS TECHNIQUE: Non-contrast CT of the chest was initially obtained. Multidetector CT imaging through the chest, abdomen and pelvis was performed using the standard protocol during bolus administration of intravenous contrast. Multiplanar reconstructed images and MIPs were obtained and reviewed to evaluate the vascular anatomy. RADIATION DOSE REDUCTION: This exam was performed according to the departmental dose-optimization program which includes automated exposure control, adjustment of the mA and/or kV according to patient size and/or use of iterative reconstruction technique. CONTRAST:  58m OMNIPAQUE IOHEXOL 350 MG/ML SOLN COMPARISON:  CT abdomen pelvis 11/02/2019 and CT chest 12/25/2015. FINDINGS: CTA CHEST FINDINGS Cardiovascular: Atherosclerotic calcification of the aorta. No evidence of an intramural hematoma or dissection flap. Ascending aorta measures 4.1 cm, similar. Atherosclerotic calcification of the aortic valve and coronary arteries. Heart size normal. No pericardial effusion. Mediastinum/Nodes: No pathologically enlarged mediastinal, hilar or axillary lymph nodes. Esophagus is unremarkable. Lungs/Pleura: Minimal dependent atelectasis. No suspicious pulmonary nodules. No pleural fluid. Airway is unremarkable. Musculoskeletal: Degenerative changes in the spine. No worrisome lytic or sclerotic lesions. Review of the MIP images confirms the above findings. CTA ABDOMEN AND PELVIS FINDINGS VASCULAR Aorta: Atherosclerotic calcification of the aorta. Chronic ulcerative plaque in the distal infrarenal aorta. Celiac: Trunk is widely patent. 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 SMA: Patent. Renals: Patent. IMA: Patent. Inflow: Patent. Veins: Unopacified, limiting evaluation. Review of the MIP images confirms the above findings.  NON-VASCULAR Hepatobiliary: Liver is unremarkable. Cholecystectomy. No biliary ductal dilatation. Pancreas: Negative. Spleen: Negative. Adrenals/Urinary Tract: Adrenal glands are unremarkable. Right perinephric edema. Moderate to severe right hydronephrosis secondary to a 5 mm stone in the proximal right ureter (10/208). Remainder of the right ureter is decompressed. Low-attenuation lesions in the kidneys measure up to 6.3 cm on the right, indicative of cysts. No follow-up necessary. Statistically, cysts are likely. No follow-up necessary. Left ureter is decompressed. Bladder is grossly unremarkable. Stomach/Bowel: Small hiatal hernia. Stomach, small bowel, appendix and majority of the colon are otherwise unremarkable. Mild inflammatory stranding adjacent to the distal descending colon (10/234). Lymphatic: No pathologically enlarged lymph nodes. Reproductive: Prostate is visualized. Other: No free fluid. Small bilateral inguinal hernias contain fat. Mesenteries and peritoneum are otherwise unremarkable. Musculoskeletal: Degenerative changes in the spine. No worrisome lytic or sclerotic lesions. Review of the MIP images confirms the above findings. IMPRESSION: 1. No evidence of aortic dissection. 2. Right perinephric edema with moderate to severe right hydronephrosis secondary to a 5 mm stone in the proximal right ureter. 3. Mild diverticulitis involving the descending colon. 4. Ascending aortic aneurysm, stable. Recommend annual imaging followup by CTA or MRA. This recommendation follows 2010 ACCF/AHA/AATS/ACR/ASA/SCA/SCAI/SIR/STS/SVM Guidelines for the Diagnosis and Management of Patients with Thoracic Aortic Disease. Circulation. 2010; 121:: H852-D782 Aortic aneurysm NOS (ICD10-I71.9). 5. Aortic atherosclerosis (ICD10-I70.0). Coronary artery calcification. Electronically Signed   By: MLorin PicketM.D.   On: 01/07/2022 10:18   ? ? ? ?Subjective: ?Seen and examined at time of discharge.  Stable no distress.  Pain  well controlled.  Tolerating p.o. intake.  Stable for discharge home. ? ?Discharge Exam: ?  Vitals:  ? 01/09/22 0403 01/09/22 0821  ?BP: 137/86 (!) 134/91  ?Pulse: 82 79  ?Resp: 20 18  ?Temp: 98.8 ?F (37.1 ?C) 97

## 2022-01-09 NOTE — TOC Initial Note (Signed)
Transition of Care (TOC) - Initial/Assessment Note  ? ? ?Patient Details  ?Name: Andre Stone ?MRN: [id number] ?Date of Birth: [birth year] ? ?Transition of Care (TOC) CM/SW Contact:    ?Laurena Slimmer, RN ?Phone Number: ?01/09/2022, 10:34 AM ? ?Clinical Narrative:                 ? ? ?Transition of Care (TOC) Screening Note ? ? ?Patient Details  ?Name: Andre Stone ?Date of Birth: 12/28/1945 ? ? ?Transition of Care (TOC) CM/SW Contact:    ?Laurena Slimmer, RN ?Phone Number: ?01/09/2022, 10:34 AM ? ? ? ?Transition of Care Department [facility]) has reviewed patient and no TOC needs have been identified at this time. We will continue to monitor patient advancement through interdisciplinary progression rounds. If new patient transition needs arise, please place a TOC consult. ?  ?  ?  ? ? ?Patient Goals and CMS Choice ?  ?  ?  ? ?Expected Discharge Plan and Services ?  ?  ?  ?  ?  ?Expected Discharge Date: 01/09/22               ?  ?  ?  ?  ?  ?  ?  ?  ?  ?  ? ?Prior Living Arrangements/Services ?  ?  ?  ?       ?  ?  ?  ?  ? ?Activities of Daily Living ?Home Assistive Devices/Equipment: None ?ADL Screening (condition at time of admission) ?Patient's cognitive ability adequate to safely complete daily activities?: Yes ?Is the patient deaf or have difficulty hearing?: No ?Does the patient have difficulty seeing, even when wearing glasses/contacts?: No ?Does the patient have difficulty concentrating, remembering, or making decisions?: No ?Patient able to express need for assistance with ADLs?: Yes ?Does the patient have difficulty dressing or bathing?: No ?Independently performs ADLs?: Yes (appropriate for developmental age) ?Does the patient have difficulty walking or climbing stairs?: No ?Weakness of Legs: None ?Weakness of Arms/Hands: None ? ?Permission Sought/Granted ?  ?  ?   ?   ?   ?   ? ?Emotional Assessment ?  ?  ?  ?  ?  ?  ? ?Admission diagnosis:  Diverticulosis [K57.90] ?Nephrolithiasis  [N20.0] ?Hydronephrosis, right [N13.30] ?Ascending aortic aneurysm, unspecified whether ruptured ([place]) [I71.21] ?Patient Active Problem List  ? Diagnosis Date Noted  ? Hydronephrosis, right 01/07/2022  ? Diverticulitis large intestine w/o perforation or abscess w/o bleeding 01/07/2022  ? 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 Essential (primary) hypertension 12/28/2015  ? Gastro-esophageal reflux disease without esophagitis 12/28/2015  ? Cannot sleep 12/28/2015  ? Apnea, sleep 12/28/2015  ? 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 Community acquired pneumonia 12/25/2015  ? Pneumonia 12/25/2015  ? 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 ?PCP:  Dion Body, MD ?Pharmacy:   ?[facility], [address] ?[place] [address] ?Phone: [phone] Fax: [phone] ? ?[place] [place] [address] ?[address] ?[place] [address] ?Phone: [phone] Fax: [phone] ? ?[id number] [address] ?[place] [address] ?Phone: [phone] Fax: [phone] ? ? ? ? ?Social Determinants of Health (SDOH) Interventions ?  ? ?Readmission Risk Interventions ?   ? View : No data to  display.  ?  ?  ?  ? ? ? ?

## 2022-01-09 NOTE — Progress Notes (Signed)
Urology Consult Follow Up ? ?Subjective: ?POD# 1 cystoscopy, right ureteroscopy and stone removal, right ureteroscopic laser lithotripsy, right ureteral stent placement, right retrograde pyelogram for interpretation and bladder biopsies fulguration ? ?He states that yesterday evening he was still in severe pain, but his last Dilaudid was received at 10:00 yesterday evening.  He has since been in only minor discomfort and has some burning with urination which is to be expected with the stent.  He is also having some intermittent bloody urine which is also expected with the stent. ? ?VSS afebrile with good UOP ? ?Morning labs are pending.  Bladder biopsies, urine cutlure and stone analysis are pending.   ? ?Anti-infectives: ?Anti-infectives (From admission, onward)  ? ? Start     Dose/Rate Route Frequency Ordered Stop  ? 01/08/22 1600  metroNIDAZOLE (FLAGYL) IVPB 500 mg       ? 500 mg ?100 mL/hr over 60 Minutes Intravenous Every 8 hours 01/08/22 1459    ? 01/08/22 1330  cefTRIAXone (ROCEPHIN) 2 g in sodium chloride 0.9 % 100 mL IVPB       ? 2 g ?200 mL/hr over 30 Minutes Intravenous Every 24 hours 01/07/22 1406    ? 01/07/22 1300  cefTRIAXone (ROCEPHIN) 2 g in sodium chloride 0.9 % 100 mL IVPB       ? 2 g ?200 mL/hr over 30 Minutes Intravenous  Once 01/07/22 1257 01/07/22 1408  ? ?  ? ? ?Current Facility-Administered Medications  ?Medication Dose Route Frequency Provider Last Rate Last Admin  ? 0.9 %  sodium chloride infusion   Intravenous Continuous Nolberto Hanlon, MD 75 mL/hr at 01/09/22 0709 New Bag at 01/09/22 0709  ? amLODipine (NORVASC) tablet 10 mg  10 mg Oral Daily Agbata, Tochukwu, MD   10 mg at 01/07/22 1612  ? cefTRIAXone (ROCEPHIN) 2 g in sodium chloride 0.9 % 100 mL IVPB  2 g Intravenous Q24H Agbata, Tochukwu, MD      ? HYDROmorphone (DILAUDID) injection 1 mg  1 mg Intravenous Q4H PRN Agbata, Tochukwu, MD   1 mg at 01/08/22 2339  ? metroNIDAZOLE (FLAGYL) IVPB 500 mg  500 mg Intravenous Q8H Nolberto Hanlon, MD  100 mL/hr at 01/08/22 2343 500 mg at 01/08/22 2343  ? multivitamin with minerals tablet 1 tablet  1 tablet Oral Daily Agbata, Tochukwu, MD   1 tablet at 01/07/22 1612  ? omega-3 acid ethyl esters (LOVAZA) capsule 1 g  1 g Oral Daily Benita Gutter, RPH      ? ondansetron (ZOFRAN) tablet 4 mg  4 mg Oral Q6H PRN Agbata, Tochukwu, MD      ? Or  ? ondansetron (ZOFRAN) injection 4 mg  4 mg Intravenous Q6H PRN Agbata, Tochukwu, MD   4 mg at 01/08/22 1059  ? pantoprazole (PROTONIX) EC tablet 40 mg  40 mg Oral Daily Agbata, Tochukwu, MD   40 mg at 01/07/22 1612  ? polyethylene glycol (MIRALAX / GLYCOLAX) packet 17 g  17 g Oral BID Nolberto Hanlon, MD   17 g at 01/08/22 2059  ? pravastatin (PRAVACHOL) tablet 20 mg  20 mg Oral q1800 Agbata, Tochukwu, MD   20 mg at 01/08/22 1826  ? senna-docusate (Senokot-S) tablet 1 tablet  1 tablet Oral BID Nolberto Hanlon, MD   1 tablet at 01/08/22 2059  ? traZODone (DESYREL) tablet 100 mg  100 mg Oral QHS PRN Agbata, Tochukwu, MD      ? ? ? ?Objective: ?Vital signs in last 24 hours: ?Temp:  [  97.5 ?F (36.4 ?C)-98.8 ?F (37.1 ?C)] 98.8 ?F (37.1 ?C) (04/19 0403) ?Pulse Rate:  [77-104] 82 (04/19 0403) ?Resp:  [10-20] 20 (04/19 0403) ?BP: (118-146)/(73-99) 137/86 (04/19 0403) ?SpO2:  [92 %-99 %] 98 % (04/19 0403) ? ?Intake/Output from previous day: ?04/18 0701 - 04/19 0700 ?In: 966.1 [I.V.:866.1; IV Piggyback:100] ?Out: 1800 [Urine:1800] ?Intake/Output this shift: ?Total I/O ?In: -  ?Out: 100 [Urine:100] ? ? ?Physical Exam ?Constitutional:   ?   Appearance: He is well-developed and normal weight.  ?HENT:  ?   Head: Normocephalic and atraumatic.  ?   Mouth/Throat:  ?   Mouth: Mucous membranes are moist.  ?Eyes:  ?   Extraocular Movements: Extraocular movements intact.  ?   Pupils: Pupils are equal, round, and reactive to light.  ?Pulmonary:  ?   Effort: Pulmonary effort is normal.  ?Abdominal:  ?   General: Abdomen is flat.  ?Skin: ?   General: Skin is warm and dry.  ?Neurological:  ?   General: No  focal deficit present.  ?   Mental Status: He is alert and oriented to person, place, and time.  ?Psychiatric:     ?   Mood and Affect: Mood normal.     ?   Behavior: Behavior normal.  ? ? ?Lab Results:  ?Recent Labs  ?  01/07/22 ?0921 01/08/22 ?0354  ?WBC 10.6* 11.9*  ?HGB 14.4 13.3  ?HCT 43.3 39.2  ?PLT 173 173  ? ?BMET ?Recent Labs  ?  01/07/22 ?0921 01/08/22 ?1345  ?NA 138 136  ?K 3.9 3.7  ?CL 103 99  ?CO2 25 26  ?GLUCOSE 158* 140*  ?BUN 33* 32*  ?CREATININE 1.69* 1.91*  ?CALCIUM 9.1 8.8*  ? ?PT/INR ?No results for input(s): LABPROT, INR in the last 72 hours. ?ABG ?No results for input(s): PHART, HCO3 in the last 72 hours. ? ?Invalid input(s): PCO2, PO2 ? ?Studies/Results: ?DG OR UROLOGY CYSTO IMAGE ([place]) ? ?Result Date: 01/08/2022 ?There is no interpretation for this exam.  This order is for images obtained during a surgical procedure.  Please See "Surgeries" Tab for more information regarding the procedure.  ? ?CT Angio Chest/Abd/Pel for Dissection W and/or Wo Contrast ? ?Result Date: 01/07/2022 ?CLINICAL DATA:  Acute aortic syndrome suspected. EXAM: CT ANGIOGRAPHY CHEST, ABDOMEN AND PELVIS TECHNIQUE: Non-contrast CT of the chest was initially obtained. Multidetector CT imaging through the chest, abdomen and pelvis was performed using the standard protocol during bolus administration of intravenous contrast. Multiplanar reconstructed images and MIPs were obtained and reviewed to evaluate the vascular anatomy. RADIATION DOSE REDUCTION: This exam was performed according to the departmental dose-optimization program which includes automated exposure control, adjustment of the mA and/or kV according to patient size and/or use of iterative reconstruction technique. CONTRAST:  65m OMNIPAQUE IOHEXOL 350 MG/ML SOLN COMPARISON:  CT abdomen pelvis 11/02/2019 and CT chest 12/25/2015. FINDINGS: CTA CHEST FINDINGS Cardiovascular: Atherosclerotic calcification of the aorta. No evidence of an intramural hematoma or  dissection flap. Ascending aorta measures 4.1 cm, similar. Atherosclerotic calcification of the aortic valve and coronary arteries. Heart size normal. No pericardial effusion. Mediastinum/Nodes: No pathologically enlarged mediastinal, hilar or axillary lymph nodes. Esophagus is unremarkable. Lungs/Pleura: Minimal dependent atelectasis. No suspicious pulmonary nodules. No pleural fluid. Airway is unremarkable. Musculoskeletal: Degenerative changes in the spine. No worrisome lytic or sclerotic lesions. Review of the MIP images confirms the above findings. CTA ABDOMEN AND PELVIS FINDINGS VASCULAR Aorta: Atherosclerotic calcification of the aorta. Chronic ulcerative plaque in the distal infrarenal aorta.  Celiac: Trunk is widely patent. 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 SMA: Patent. Renals: Patent. IMA: Patent. Inflow: Patent. Veins: Unopacified, limiting evaluation. Review of the MIP images confirms the above findings. NON-VASCULAR Hepatobiliary: Liver is unremarkable. Cholecystectomy. No biliary ductal dilatation. Pancreas: Negative. Spleen: Negative. Adrenals/Urinary Tract: Adrenal glands are unremarkable. Right perinephric edema. Moderate to severe right hydronephrosis secondary to a 5 mm stone in the proximal right ureter (10/208). Remainder of the right ureter is decompressed. Low-attenuation lesions in the kidneys measure up to 6.3 cm on the right, indicative of cysts. No follow-up necessary. Statistically, cysts are likely. No follow-up necessary. Left ureter is decompressed. Bladder is grossly unremarkable. Stomach/Bowel: Small hiatal hernia. Stomach, small bowel, appendix and majority of the colon are otherwise unremarkable. Mild inflammatory stranding adjacent to the distal descending colon (10/234). Lymphatic: No pathologically enlarged lymph nodes. Reproductive: Prostate is visualized. Other: No free fluid. Small bilateral  inguinal hernias contain fat. Mesenteries and peritoneum are otherwise unremarkable. Musculoskeletal: Degenerative changes in the spine. No worrisome lytic or sclerotic lesions. Review of the MIP images co

## 2022-01-09 NOTE — Progress Notes (Signed)
Patient discharged via wife transport with all belongings. AVS and medications reviewed with patient. PIVX1 removed with cathter intact. No complications or needs at discharge. ?

## 2022-01-09 NOTE — Plan of Care (Signed)

## 2022-01-11 ENCOUNTER — Telehealth: Payer: Self-pay | Admitting: Urology

## 2022-01-11 NOTE — Telephone Encounter (Signed)
I contacted Andre Stone to discuss his bladder pathology results.  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  Endoscopically the appeared superficial. ?The pathology report was discussed and recommend 6-week course of induction BCG.  We discussed potential side effects including irritative voiding symptoms, flulike symptoms and rarely BCG sepsis.  Will schedule for late May/early June depending on his preference. ?

## 2022-01-12 LAB — CALCULI, WITH PHOTOGRAPH (CLINICAL LAB)
Uric Acid Calculi: 100 %
Weight Calculi: 3 mg

## 2022-01-23 NOTE — Telephone Encounter (Signed)
Patient came to the office BCG instructions were discussed. Induction appointments were made and BCG information was sent via my chart as a reminder ?

## 2022-02-04 ENCOUNTER — Encounter: Payer: Medicare PPO | Admitting: Urology

## 2022-02-18 ENCOUNTER — Telehealth: Payer: Self-pay | Admitting: Urology

## 2022-02-18 NOTE — Telephone Encounter (Signed)
Andre Stone still needs a renal ultrasound to evaluate for iatrogenic hydronephrosis after his stent was removed a month ago.

## 2022-02-19 NOTE — Telephone Encounter (Signed)
Notified patient as instructed, patient pleased. Discussed follow-up appointments, patient agrees  

## 2022-02-21 ENCOUNTER — Ambulatory Visit: Payer: Medicare PPO | Admitting: Physician Assistant

## 2022-02-21 DIAGNOSIS — C679 Malignant neoplasm of bladder, unspecified: Secondary | ICD-10-CM

## 2022-02-21 DIAGNOSIS — Z8551 Personal history of malignant neoplasm of bladder: Secondary | ICD-10-CM

## 2022-02-21 DIAGNOSIS — N1330 Unspecified hydronephrosis: Secondary | ICD-10-CM

## 2022-02-21 MED ORDER — BCG LIVE 50 MG IS SUSR
3.2400 mL | Freq: Once | INTRAVESICAL | Status: AC
  Administered 2022-02-21: 81 mg via INTRAVESICAL

## 2022-02-21 NOTE — Progress Notes (Signed)
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 Patient was cleaned and prepped in a sterile fashion with betadine. A 14FR catheter was inserted, urine return was noted 46m, urine was yellow in color.  569mof reconstituted BCG was instilled into the bladder. The catheter was then removed. Patient tolerated well, no complications were noted  Performed by: SaDebroah LoopPA-C and OkGordy ClementCMA  Additional notes: Patient remained in clinic for 30 minutes following instillation today for monitoring of hypersensitivity reaction; none noted.  We reviewed post-instillation instructions including holding the urine for 2 hours with quarter turns every 15 minutes and pouring bleach into the toilet with subsequent voids for 6 hours. Written instructions also provided today. He expressed understanding.   Previously-recommended RUS never ordered; I ordered this today.  Follow up/ Additional notes: 1 week for BCG #2 of 6

## 2022-02-21 NOTE — Patient Instructions (Signed)
Your Timeline for Today:  Right now through 2pm: Hold your urine and do your quarter turns every 15 minutes. 2pm-8pm today: Every time you urinate, pour 1/2 cup of bleach into the toilet and let it sit for 15 minutes prior to flushing. 8pm onward: Resume your normal routine.   Patient Education: (BCG) Into the Bladder (Intravesical Chemotherapy)  BCG is a vaccine which is used to prevent tuberculosis (TB).  But it's also a helpful treatment for some early bladder cancers.  When BCG goes directly into the bladder the treatment is described as intravesical.  BCG is a type of immunotherapy.  Immunotherapy stimulates the body's immune system to destroy cancer cells.  How it's given BCG treatment is given to you in an outpatient setting.  It takes a few minutes to administer and you can go home as soon as it's finished.  It might be a good idea to ask someone to bring you, particularly the fist time.  Unlike chemotherapy into the bladder, BCG treatment is never given immediately after surgery to remove bladder tumors.  There needs to be a delay usually of at least two weeks after surgery, before you can have it.  You won't be given treatment with BCG if you are unwell or have an infection in your urine.  You're usually asked to limit the amount you drink before your treatment.  This will help to increase the concentration of BCG in your bladder.  Drinking too much before your treatment may make your bladder feel uncomfortably full.  If you normally take water tablets (diuretics) take them later in the day after your treatment.  Your nurse or doctor will give you more advise about preparing for your treatment.  You will have a small tube (catheter) placed into your bladder.  Your doctor will then put the liquid vaccine directly into your bladder through the catheter and remove the catheter.  You will need to hold your urine for two hours afterwards.  This can be difficult but it's to give the treatment  time to work.  You can walk around during this time.  When the treatment is over you can go to the toilet.  After your treatment there are some precautions you'll need to take.  This is because BCG is a live vaccine and other people shouldn't be exposed to it.  For the next six hours, you'll need to avoid your urine splashing on the toilet seat and getting any urine on your hands.  It might be easer for men to sit down when they're using an ordinary toilet although using a stand up urinal should be alright.  The main this is to avoid splashing urine and spreading the vaccine.  You will also be asked to put 1/2 cup undiluted bleach into the toilet to destroy any live vaccine and leave it for 15 minutes until you flush.  Side Effects Because BCG goes directly into the bladder most of the side effects are linked with the bladder.  They usually go away within one to two days after your treatment.  The most common ones are: -needing to pass urine often -pain when you pass urine -blood in urine -flu-like symptoms (tiredness, general aching and raised temperature)  Theses side effects should settle down within a day or two.  If they don't get better contact your doctor.  Drinking lots of fluids can help flush the drug out of your bladder and reduce some of these effects.  Taking Ibuprofen or Aleve is  encouraged unless you have a condition that would make these medications unsafe to take (renal failure, diabetes, gerd)  Rare side effects can include a continuing high temperature (fever), pain in your joints and a cough.  If you have any of these symptoms, or if you feel generally unwell, contact your doctor.  These symptoms could be a sign of a more serious infection (due to BCG) that needs to be treated immediately.  If this happens you'll be treated with the same drugs (antibiotics) that are used to treat TB.  Contraception Men should use a condom during sex for the first 48 hours after their treatment.   If you are a women who has had BCG treatment then your partner should use a condom.  Using a condom will protect your partner from any vaccine present in your semen or vaginal fluid.  We don't know how BCG may affect a developing fetus so it's not advisable to become pregnant or father a child while having it.  It is important to use effective contraception during your treatment and for six weeks afterwards.  You can discuss this with your doctor or specialist nurse.

## 2022-02-22 LAB — MICROSCOPIC EXAMINATION: Bacteria, UA: NONE SEEN

## 2022-02-22 LAB — URINALYSIS, COMPLETE
Bilirubin, UA: NEGATIVE
Glucose, UA: NEGATIVE
Ketones, UA: NEGATIVE
Leukocytes,UA: NEGATIVE
Nitrite, UA: NEGATIVE
RBC, UA: NEGATIVE
Specific Gravity, UA: 1.025 (ref 1.005–1.030)
Urobilinogen, Ur: 0.2 mg/dL (ref 0.2–1.0)
pH, UA: 5.5 (ref 5.0–7.5)

## 2022-02-28 ENCOUNTER — Ambulatory Visit: Payer: Medicare PPO | Admitting: Physician Assistant

## 2022-02-28 VITALS — Temp 98.0°F

## 2022-02-28 DIAGNOSIS — C679 Malignant neoplasm of bladder, unspecified: Secondary | ICD-10-CM

## 2022-02-28 DIAGNOSIS — N390 Urinary tract infection, site not specified: Secondary | ICD-10-CM

## 2022-02-28 DIAGNOSIS — Z8551 Personal history of malignant neoplasm of bladder: Secondary | ICD-10-CM

## 2022-02-28 LAB — MICROSCOPIC EXAMINATION

## 2022-02-28 LAB — URINALYSIS, COMPLETE
Bilirubin, UA: NEGATIVE
Glucose, UA: NEGATIVE
Ketones, UA: NEGATIVE
Nitrite, UA: NEGATIVE
Specific Gravity, UA: 1.03 (ref 1.005–1.030)
Urobilinogen, Ur: 0.2 mg/dL (ref 0.2–1.0)
pH, UA: 5 (ref 5.0–7.5)

## 2022-02-28 MED ORDER — SULFAMETHOXAZOLE-TRIMETHOPRIM 800-160 MG PO TABS: 1.0000 | ORAL_TABLET | Freq: Two times a day (BID) | ORAL | 0 refills | Status: DC

## 2022-02-28 NOTE — Progress Notes (Signed)
Patient presented to the clinic today for a scheduled BCG instillation.  Urine grossly infected on UA, culture pending.  Patient reports general malaise and body aches.  Will treat for acute UTI today.  Prescription for Bactrim DS BID x7 days sent to pharmacy.  Patient to return to clinic next week for next scheduled BCG treatment.  We will add an additional treatment to his scheduled series to make up for today's missed instillation.  Debroah Loop, PA-C 02/28/22 9:04 AM

## 2022-03-04 LAB — CULTURE, URINE COMPREHENSIVE

## 2022-03-04 MED ORDER — NITROFURANTOIN MONOHYD MACRO 100 MG PO CAPS: 100.0000 mg | ORAL_CAPSULE | Freq: Two times a day (BID) | ORAL | 0 refills | Status: AC

## 2022-03-05 ENCOUNTER — Ambulatory Visit
Admission: RE | Admit: 2022-03-05 | Discharge: 2022-03-05 | Disposition: A | Discharge status: Home / Self Care | Payer: Medicare PPO | Source: Ambulatory Visit | Attending: Physician Assistant | Admitting: Physician Assistant

## 2022-03-05 DIAGNOSIS — N1330 Unspecified hydronephrosis: Secondary | ICD-10-CM | POA: Diagnosis present

## 2022-03-07 ENCOUNTER — Ambulatory Visit: Payer: Medicare PPO | Admitting: Physician Assistant

## 2022-03-07 DIAGNOSIS — C679 Malignant neoplasm of bladder, unspecified: Secondary | ICD-10-CM | POA: Diagnosis not present

## 2022-03-07 DIAGNOSIS — Z8551 Personal history of malignant neoplasm of bladder: Secondary | ICD-10-CM

## 2022-03-07 LAB — URINALYSIS, COMPLETE
Bilirubin, UA: NEGATIVE
Glucose, UA: NEGATIVE
Ketones, UA: NEGATIVE
Leukocytes,UA: NEGATIVE
Nitrite, UA: NEGATIVE
Protein,UA: NEGATIVE
RBC, UA: NEGATIVE
Specific Gravity, UA: 1.025 (ref 1.005–1.030)
Urobilinogen, Ur: 0.2 mg/dL (ref 0.2–1.0)
pH, UA: 5.5 (ref 5.0–7.5)

## 2022-03-07 LAB — MICROSCOPIC EXAMINATION

## 2022-03-07 MED ORDER — BCG LIVE 50 MG IS SUSR
3.2400 mL | Freq: Once | INTRAVESICAL | Status: AC
  Administered 2022-03-07: 81 mg via INTRAVESICAL

## 2022-03-07 NOTE — Progress Notes (Signed)
BCG Bladder Instillation  BCG # 2 of 6  Due to Bladder Cancer patient is present today for a BCG treatment. Patient was cleaned and prepped in a sterile fashion with betadine. A 14FR catheter was inserted, urine return was noted 35m, urine was yellow in color.  554mof reconstituted BCG was instilled into the bladder. The catheter was then removed. Patient tolerated well, no complications were noted  Performed by: SaDebroah LoopPA-C and OkGordy ClementCMLumberportFollow up/ Additional notes: 1 week for BCG #3 of 6

## 2022-03-07 NOTE — Patient Instructions (Addendum)
Your Timeline for Today:  Right now through 11:40am: Hold your urine and do your quarter turns every 15 minutes. 11:40am-5:40pm today: Every time you urinate, pour 1/2 cup of bleach into the toilet and let it sit for 15 minutes prior to flushing. 5:40pm onward: Resume your normal routine.   Patient Education: (BCG) Into the Bladder (Intravesical Chemotherapy)  BCG is a vaccine which is used to prevent tuberculosis (TB).  But it's also a helpful treatment for some early bladder cancers.  When BCG goes directly into the bladder the treatment is described as intravesical.  BCG is a type of immunotherapy.  Immunotherapy stimulates the body's immune system to destroy cancer cells.  How it's given BCG treatment is given to you in an outpatient setting.  It takes a few minutes to administer and you can go home as soon as it's finished.  It might be a good idea to ask someone to bring you, particularly the fist time.  Unlike chemotherapy into the bladder, BCG treatment is never given immediately after surgery to remove bladder tumors.  There needs to be a delay usually of at least two weeks after surgery, before you can have it.  You won't be given treatment with BCG if you are unwell or have an infection in your urine.  You're usually asked to limit the amount you drink before your treatment.  This will help to increase the concentration of BCG in your bladder.  Drinking too much before your treatment may make your bladder feel uncomfortably full.  If you normally take water tablets (diuretics) take them later in the day after your treatment.  Your nurse or doctor will give you more advise about preparing for your treatment.  You will have a small tube (catheter) placed into your bladder.  Your doctor will then put the liquid vaccine directly into your bladder through the catheter and remove the catheter.  You will need to hold your urine for two hours afterwards.  This can be difficult but it's to give  the treatment time to work.  You can walk around during this time.  When the treatment is over you can go to the toilet.  After your treatment there are some precautions you'll need to take.  This is because BCG is a live vaccine and other people shouldn't be exposed to it.  For the next six hours, you'll need to avoid your urine splashing on the toilet seat and getting any urine on your hands.  It might be easer for men to sit down when they're using an ordinary toilet although using a stand up urinal should be alright.  The main this is to avoid splashing urine and spreading the vaccine.  You will also be asked to put 1/2 cup undiluted bleach into the toilet to destroy any live vaccine and leave it for 15 minutes until you flush.  Side Effects Because BCG goes directly into the bladder most of the side effects are linked with the bladder.  They usually go away within one to two days after your treatment.  The most common ones are: -needing to pass urine often -pain when you pass urine -blood in urine -flu-like symptoms (tiredness, general aching and raised temperature)  Theses side effects should settle down within a day or two.  If they don't get better contact your doctor.  Drinking lots of fluids can help flush the drug out of your bladder and reduce some of these effects.  Taking Ibuprofen or Aleve is  encouraged unless you have a condition that would make these medications unsafe to take (renal failure, diabetes, gerd)  Rare side effects can include a continuing high temperature (fever), pain in your joints and a cough.  If you have any of these symptoms, or if you feel generally unwell, contact your doctor.  These symptoms could be a sign of a more serious infection (due to BCG) that needs to be treated immediately.  If this happens you'll be treated with the same drugs (antibiotics) that are used to treat TB.  Contraception Men should use a condom during sex for the first 48 hours after  their treatment.  If you are a women who has had BCG treatment then your partner should use a condom.  Using a condom will protect your partner from any vaccine present in your semen or vaginal fluid.  We don't know how BCG may affect a developing fetus so it's not advisable to become pregnant or father a child while having it.  It is important to use effective contraception during your treatment and for six weeks afterwards.  You can discuss this with your doctor or specialist nurse.

## 2022-03-14 ENCOUNTER — Ambulatory Visit: Payer: Medicare PPO | Admitting: Physician Assistant

## 2022-03-14 ENCOUNTER — Encounter: Payer: Self-pay | Admitting: Physician Assistant

## 2022-03-14 DIAGNOSIS — C679 Malignant neoplasm of bladder, unspecified: Secondary | ICD-10-CM

## 2022-03-14 LAB — URINALYSIS, COMPLETE
Bilirubin, UA: NEGATIVE
Glucose, UA: NEGATIVE
Ketones, UA: NEGATIVE
Leukocytes,UA: NEGATIVE
Nitrite, UA: NEGATIVE
Protein,UA: NEGATIVE
Specific Gravity, UA: 1.02 (ref 1.005–1.030)
Urobilinogen, Ur: 0.2 mg/dL (ref 0.2–1.0)
pH, UA: 5 (ref 5.0–7.5)

## 2022-03-14 LAB — MICROSCOPIC EXAMINATION: Bacteria, UA: NONE SEEN

## 2022-03-14 MED ORDER — BCG LIVE 50 MG IS SUSR
3.2400 mL | Freq: Once | INTRAVESICAL | Status: AC
  Administered 2022-03-14: 81 mg via INTRAVESICAL

## 2022-03-14 NOTE — Progress Notes (Signed)
BCG Bladder Instillation   BCG # 3 of 6   Due to Bladder Cancer patient is present today for a BCG treatment. Patient was cleaned and prepped in a sterile fashion with betadine. A 14FR catheter was inserted, urine return was noted 60m, urine was yellow in color.  561mof reconstituted BCG was instilled into the bladder. The catheter was then removed. Patient tolerated well, no complications were noted   Performed by: SaDebroah LoopPA-C and JeGaspar ColaMA    Follow up/ Additional notes: 1 week for BCG #4 of 6

## 2022-03-21 ENCOUNTER — Ambulatory Visit: Payer: Medicare PPO | Admitting: Physician Assistant

## 2022-03-28 ENCOUNTER — Ambulatory Visit: Payer: Medicare PPO | Admitting: Physician Assistant

## 2022-03-28 VITALS — BP 153/103 | HR 74 | Temp 98.0°F

## 2022-03-28 DIAGNOSIS — R5383 Other fatigue: Secondary | ICD-10-CM | POA: Diagnosis not present

## 2022-03-28 DIAGNOSIS — C679 Malignant neoplasm of bladder, unspecified: Secondary | ICD-10-CM

## 2022-03-28 LAB — URINALYSIS, COMPLETE
Bilirubin, UA: NEGATIVE
Glucose, UA: NEGATIVE
Ketones, UA: NEGATIVE
Leukocytes,UA: NEGATIVE
Nitrite, UA: NEGATIVE
Protein,UA: NEGATIVE
Specific Gravity, UA: 1.02 (ref 1.005–1.030)
Urobilinogen, Ur: 0.2 mg/dL (ref 0.2–1.0)
pH, UA: 5 (ref 5.0–7.5)

## 2022-03-28 LAB — MICROSCOPIC EXAMINATION: Bacteria, UA: NONE SEEN

## 2022-03-28 MED ORDER — BCG LIVE 50 MG IS SUSR
3.2400 mL | Freq: Once | INTRAVESICAL | Status: AC
  Administered 2022-03-28: 81 mg via INTRAVESICAL

## 2022-03-28 NOTE — Progress Notes (Signed)
BCG Bladder Instillation  BCG # 4 of 6  Due to Bladder Cancer patient is present today for a BCG treatment. Patient was cleaned and prepped in a sterile fashion with betadine. A 14FR catheter was inserted, urine return was noted 64m, urine was yellow in color.  577mof reconstituted BCG was instilled into the bladder. The catheter was then removed. Patient tolerated well, no complications were noted  Performed by: SaDebroah LoopPA-C and AlKerman PasseyCMA  Additional notes: Patient reports worsening fatigue since starting BCG, wonders if this could be a side effect. He has not noticed a change in symptoms relative to BCG instillation timing. He denies fevers and VSS today as below. UA is reassuring for infection. He has a history of chronic anemia.   We discussed that fatigue can be a side effect of BCG, however it typically worsens in the days following an instillation and improves over the following days. His chronic, stable fatigue is concerning for other etiologies. Will recheck H&H today and have him follow up with PCP pending results. Patient is in agreement with this plan.  Vitals:   03/28/22 0939  BP: (!) 153/103  Pulse: 74  Temp: 98 F (36.7 C)    Follow up: 1 week for BCG #5 of 6

## 2022-03-29 LAB — HEMOGLOBIN AND HEMATOCRIT, BLOOD
Hematocrit: 40.1 % (ref 37.5–51.0)
Hemoglobin: 13.5 g/dL (ref 13.0–17.7)

## 2022-04-04 ENCOUNTER — Ambulatory Visit: Payer: Medicare PPO | Admitting: Physician Assistant

## 2022-04-04 DIAGNOSIS — C679 Malignant neoplasm of bladder, unspecified: Secondary | ICD-10-CM | POA: Diagnosis not present

## 2022-04-04 LAB — URINALYSIS, COMPLETE
Bilirubin, UA: NEGATIVE
Glucose, UA: NEGATIVE
Ketones, UA: NEGATIVE
Leukocytes,UA: NEGATIVE
Nitrite, UA: NEGATIVE
Protein,UA: NEGATIVE
RBC, UA: NEGATIVE
Specific Gravity, UA: 1.025 (ref 1.005–1.030)
Urobilinogen, Ur: 0.2 mg/dL (ref 0.2–1.0)
pH, UA: 5.5 (ref 5.0–7.5)

## 2022-04-04 LAB — MICROSCOPIC EXAMINATION
Bacteria, UA: NONE SEEN
RBC, Urine: NONE SEEN /hpf (ref 0–2)

## 2022-04-04 MED ORDER — BCG LIVE 50 MG IS SUSR
3.2400 mL | Freq: Once | INTRAVESICAL | Status: AC
  Administered 2022-04-04: 81 mg via INTRAVESICAL

## 2022-04-04 NOTE — Progress Notes (Signed)
BCG Bladder Instillation  BCG # 5 of 6  Due to Bladder Cancer patient is present today for a BCG treatment. Patient was cleaned and prepped in a sterile fashion with betadine. A 14FR catheter was inserted, urine return was noted 16m, urine was yellow in color.  576mof reconstituted BCG was instilled into the bladder. The catheter was then removed. Patient tolerated well, no complications were noted  Performed by: SaDebroah LoopPA-C and CaElberta LeatherwoodCMA  Additional notes: Patient reports previously-reported fatigue has resolved.  Follow up: 1 week for BCG #6 of 6

## 2022-04-04 NOTE — Progress Notes (Signed)
BCG Bladder Instillation  BCG # 5 of 6  Due to Bladder Cancer patient is present today for a BCG treatment. Patient was cleaned and prepped in a sterile fashion with betadine. A 16FR catheter was inserted, urine return was noted 2m, urine was yellow in color.  51mof reconstituted BCG was instilled into the bladder. The catheter was then removed. Patient tolerated well, no complications were noted  Performed by: CaElberta LeatherwoodCMA, SaDebroah LoopPA-C  Follow up/ Additional notes: 1 week #6

## 2022-04-11 ENCOUNTER — Ambulatory Visit: Payer: Medicare PPO | Admitting: Physician Assistant

## 2022-04-11 DIAGNOSIS — C679 Malignant neoplasm of bladder, unspecified: Secondary | ICD-10-CM

## 2022-04-11 LAB — URINALYSIS, COMPLETE
Bilirubin, UA: NEGATIVE
Glucose, UA: NEGATIVE
Ketones, UA: NEGATIVE
Leukocytes,UA: NEGATIVE
Nitrite, UA: NEGATIVE
Protein,UA: NEGATIVE
Specific Gravity, UA: 1.01 (ref 1.005–1.030)
Urobilinogen, Ur: 0.2 mg/dL (ref 0.2–1.0)
pH, UA: 5 (ref 5.0–7.5)

## 2022-04-11 LAB — MICROSCOPIC EXAMINATION
Bacteria, UA: NONE SEEN
RBC, Urine: NONE SEEN /hpf (ref 0–2)

## 2022-04-11 MED ORDER — BCG LIVE 50 MG IS SUSR
3.2400 mL | Freq: Once | INTRAVESICAL | Status: AC
  Administered 2022-04-11: 81 mg via INTRAVESICAL

## 2022-04-11 NOTE — Progress Notes (Signed)
BCG Bladder Instillation  BCG # 6 of 6  Due to Bladder Cancer patient is present today for a BCG treatment. Patient was cleaned and prepped in a sterile fashion with betadine. A 14FR catheter was inserted, urine return was noted 71m, urine was yellow in color.  59mof reconstituted BCG was instilled into the bladder. The catheter was then removed. Patient tolerated well, no complications were noted  Performed by: SaDebroah LoopPA-C and ReVerlene MayerCMA  Follow up/ Additional notes: 3 month cysto with Dr. StBernardo Heater

## 2022-05-30 ENCOUNTER — Other Ambulatory Visit: Payer: Self-pay | Admitting: Family Medicine

## 2022-05-30 DIAGNOSIS — R0789 Other chest pain: Secondary | ICD-10-CM

## 2022-05-30 DIAGNOSIS — E782 Mixed hyperlipidemia: Secondary | ICD-10-CM

## 2022-06-07 ENCOUNTER — Ambulatory Visit
Admission: RE | Admit: 2022-06-07 | Discharge: 2022-06-07 | Disposition: A | Discharge status: Home / Self Care | Payer: No Typology Code available for payment source | Source: Ambulatory Visit | Attending: Family Medicine | Admitting: Family Medicine

## 2022-06-07 DIAGNOSIS — R0789 Other chest pain: Secondary | ICD-10-CM

## 2022-06-07 DIAGNOSIS — E782 Mixed hyperlipidemia: Secondary | ICD-10-CM

## 2022-07-03 ENCOUNTER — Other Ambulatory Visit: Payer: Medicare PPO | Admitting: Urology

## 2022-07-17 ENCOUNTER — Other Ambulatory Visit: Payer: Medicare PPO | Admitting: Urology

## 2022-07-18 ENCOUNTER — Encounter: Payer: Self-pay | Admitting: Urology

## 2022-07-18 ENCOUNTER — Ambulatory Visit: Payer: Medicare PPO | Admitting: Urology

## 2022-07-18 VITALS — BP 129/77 | HR 75 | Ht 72.0 in | Wt 197.0 lb

## 2022-07-18 DIAGNOSIS — Z8551 Personal history of malignant neoplasm of bladder: Secondary | ICD-10-CM | POA: Diagnosis not present

## 2022-07-18 DIAGNOSIS — C679 Malignant neoplasm of bladder, unspecified: Secondary | ICD-10-CM

## 2022-07-18 LAB — URINALYSIS, COMPLETE
Bilirubin, UA: NEGATIVE
Glucose, UA: NEGATIVE
Ketones, UA: NEGATIVE
Leukocytes,UA: NEGATIVE
Nitrite, UA: NEGATIVE
Protein,UA: NEGATIVE
RBC, UA: NEGATIVE
Specific Gravity, UA: 1.025 (ref 1.005–1.030)
Urobilinogen, Ur: 0.2 mg/dL (ref 0.2–1.0)
pH, UA: 5 (ref 5.0–7.5)

## 2022-07-18 LAB — MICROSCOPIC EXAMINATION

## 2022-07-18 NOTE — Progress Notes (Signed)
   07/18/22  CC:  Chief Complaint  Patient presents with   Cysto    Urologic history: 1.  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  Uric acid nephrolithiasis Hypocitraturia, mild hyperuricemia and hyperuricosuria.  Urine pH 5.9 On potassium citrate Ureteroscopic stone removal 12/2015, 10/2019, 12/2021 Stone analysis 100% uric acid   HPI: 76 y.o. male presents for surveillance cystoscopy post induction BCG.  He has no complaints.  UA today negative microscopy  Blood pressure 129/77, pulse 75, height 6' (1.829 m), weight 197 lb (89.4 kg). NED. A&Ox3.   No respiratory distress   Abd soft, NT, ND Normal phallus with bilateral descended testicles  Cystoscopy Procedure Note  Patient identification was confirmed, informed consent was obtained, and patient was prepped using Betadine solution.  Lidocaine jelly was administered per urethral meatus.     Pre-Procedure: - Inspection reveals a normal caliber ureteral meatus.  Procedure: The flexible cystoscope was introduced without difficulty - No urethral strictures/lesions are present. - Moderate lateral lobe enlargement prostate  - Normal bladder neck - Bilateral ureteral orifices identified - Bladder mucosa  reveals no ulcers, tumors, or lesions - No bladder stones - Mild trabeculation  Retroflexion shows no intravesical median lobe or tumor   Post-Procedure: - Patient tolerated the procedure well  Assessment/ Plan: No evidence recurrent urothelial carcinoma Maintenance BCG was discussed and he desires  to schedule.  He is due for a 3-week treatment Follow-up surveillance cystoscopy in 3 months    [name], MD

## 2022-07-19 ENCOUNTER — Telehealth: Payer: Self-pay

## 2022-07-19 ENCOUNTER — Encounter: Payer: Self-pay | Admitting: Urology

## 2022-07-19 NOTE — Telephone Encounter (Signed)
-----   Message from [name], MD sent at 07/18/2022  4:55 PM EDT ----- Regarding: Manage BCG Hi Andre Stone, please schedule maintenance BCG x3.  He completed induction BCG in July so is due whenever you can schedule.  Thanks

## 2022-07-19 NOTE — Telephone Encounter (Signed)
Pt scheduled for 3 mBCG appts.

## 2022-07-24 NOTE — Progress Notes (Unsigned)
BCG Bladder Instillation  BCG # 1/3  Due to Bladder Cancer patient is present today for a BCG treatment. Patient was cleaned and prepped in a sterile fashion with betadine. A 14 FR catheter was inserted, urine return was noted 100 ml, urine was yellow clear in color.  60m of reconstituted BCG was instilled into the bladder. The catheter was then removed. Patient tolerated well, no complications were noted  Performed by: SZara Council PA-C and CEvelina Bucy CMA   Follow up/ Additional notes: One week for #2/3

## 2022-07-25 ENCOUNTER — Ambulatory Visit: Payer: Medicare PPO | Admitting: Urology

## 2022-07-25 DIAGNOSIS — C679 Malignant neoplasm of bladder, unspecified: Secondary | ICD-10-CM

## 2022-07-25 LAB — MICROSCOPIC EXAMINATION

## 2022-07-25 LAB — URINALYSIS, COMPLETE
Bilirubin, UA: NEGATIVE
Glucose, UA: NEGATIVE
Ketones, UA: NEGATIVE
Leukocytes,UA: NEGATIVE
Nitrite, UA: NEGATIVE
Protein,UA: NEGATIVE
Specific Gravity, UA: 1.025 (ref 1.005–1.030)
Urobilinogen, Ur: 0.2 mg/dL (ref 0.2–1.0)
pH, UA: 5 (ref 5.0–7.5)

## 2022-07-25 MED ORDER — BCG LIVE 50 MG IS SUSR
3.2400 mL | Freq: Once | INTRAVESICAL | Status: AC
  Administered 2022-07-25: 81 mg via INTRAVESICAL

## 2022-08-01 ENCOUNTER — Ambulatory Visit: Payer: Medicare PPO | Admitting: Physician Assistant

## 2022-08-01 VITALS — BP 143/70 | HR 60 | Ht <= 58 in | Wt 197.0 lb

## 2022-08-01 DIAGNOSIS — C679 Malignant neoplasm of bladder, unspecified: Secondary | ICD-10-CM

## 2022-08-01 DIAGNOSIS — Z8551 Personal history of malignant neoplasm of bladder: Secondary | ICD-10-CM

## 2022-08-01 LAB — MICROSCOPIC EXAMINATION

## 2022-08-01 LAB — URINALYSIS, COMPLETE
Bilirubin, UA: NEGATIVE
Glucose, UA: NEGATIVE
Ketones, UA: NEGATIVE
Leukocytes,UA: NEGATIVE
Nitrite, UA: NEGATIVE
Protein,UA: NEGATIVE
Specific Gravity, UA: 1.015 (ref 1.005–1.030)
Urobilinogen, Ur: 0.2 mg/dL (ref 0.2–1.0)
pH, UA: 5 (ref 5.0–7.5)

## 2022-08-01 MED ORDER — BCG LIVE 50 MG IS SUSR
3.2400 mL | Freq: Once | INTRAVESICAL | Status: AC
  Administered 2022-08-01: 81 mg via INTRAVESICAL

## 2022-08-01 NOTE — Progress Notes (Signed)
BCG Bladder Instillation  BCG # 2 of 3  Due to Bladder Cancer patient is present today for a BCG treatment. Patient was cleaned and prepped in a sterile fashion with betadine. A 14FR catheter was inserted, urine return was noted 61m, urine was yellow in color.  563mof reconstituted BCG was instilled into the bladder. The catheter was then removed. Patient tolerated well, no complications were noted  Performed by: SaDebroah LoopPA-C and TaMardelle MatteCMA  Follow up/ Additional notes: 1 week for BCG #3 of 3

## 2022-08-07 ENCOUNTER — Ambulatory Visit: Payer: Medicare PPO | Admitting: Physician Assistant

## 2022-08-08 ENCOUNTER — Ambulatory Visit: Payer: Medicare PPO | Admitting: Cardiovascular Disease

## 2022-08-09 ENCOUNTER — Ambulatory Visit: Payer: Medicare PPO | Admitting: Physician Assistant

## 2022-08-09 DIAGNOSIS — C679 Malignant neoplasm of bladder, unspecified: Secondary | ICD-10-CM

## 2022-08-09 LAB — MICROSCOPIC EXAMINATION

## 2022-08-09 LAB — URINALYSIS, COMPLETE
Bilirubin, UA: NEGATIVE
Glucose, UA: NEGATIVE
Ketones, UA: NEGATIVE
Leukocytes,UA: NEGATIVE
Nitrite, UA: NEGATIVE
Protein,UA: NEGATIVE
RBC, UA: NEGATIVE
Specific Gravity, UA: 1.025 (ref 1.005–1.030)
Urobilinogen, Ur: 0.2 mg/dL (ref 0.2–1.0)
pH, UA: 5.5 (ref 5.0–7.5)

## 2022-08-09 MED ORDER — BCG LIVE 50 MG IS SUSR
3.2400 mL | Freq: Once | INTRAVESICAL | Status: AC
  Administered 2022-08-09: 81 mg via INTRAVESICAL

## 2022-08-09 NOTE — Progress Notes (Signed)
BCG Bladder Instillation  BCG # 3 of 3  Due to Bladder Cancer patient is present today for a BCG treatment. Patient was cleaned and prepped in a sterile fashion with betadine. A 14FR catheter was inserted, urine return was noted 55m, urine was yellow in color.  513mof reconstituted BCG was instilled into the bladder. The catheter was then removed. Patient tolerated well, no complications were noted  Performed by: SaDebroah LoopPA-C and TaMardelle MatteCMA  Follow up/ Additional notes: 3 month cysto with Dr. StBernardo Heater

## 2022-08-11 DIAGNOSIS — R931 Abnormal findings on diagnostic imaging of heart and coronary circulation: Secondary | ICD-10-CM | POA: Insufficient documentation

## 2022-08-11 NOTE — Progress Notes (Unsigned)
Cardiology Office Note   Date:  08/12/2022   ID:  Andre Stone, Andre Stone 12/12/1945, MRN [id number]  PCP:  Dion Body, MD  Cardiologist:   None Referring:  Dion Body, MD  Chief Complaint  Patient presents with   Chest Pain      History of Present Illness: Andre Stone is a 76 y.o. male who presents for evaluation of elevated coronary calcium.  He having chest discomfort which is why he had the coronary calcium score ordered.  His only past cardiac history was stress perfusion study in 2019 for evaluation of chest discomfort.  He had a well-preserved ejection fraction of 62% and no ischemia.  I reviewed these records for this visit.  His calcium score done recently was 937 with 3 vessels demonstrating calcium.  He was 77th percentile.  He does get chest discomfort.  He said that this has been going on for a while and is sporadic.  It happens at rest.  He does not necessarily bring it on with activity.  It is 3 out of 10 in intensity.  It is left of sternum.  It is dull.  It last for 2 to 4 minutes.  It goes away spontaneously.  He is done some exercising recently and rides a recumbent bike without necessarily bring this on.  If he hurries very quickly he will feel pounding in his chest.  This does not necessarily bring on discomfort.  He has had no associated nausea vomiting diaphoresis.  He has not had any shortness of breath, PND or orthopnea.  He had no weight gain or edema.   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   Community acquired pneumonia 12/25/2015   Essential (primary) hypertension 12/28/2015   Gastro-esophageal reflux disease without esophagitis 12/28/2015   History of kidney stones    Hypertension     Past Surgical History:  Procedure Laterality Date   CHOLECYSTECTOMY     CYSTOSCOPY W/ RETROGRADES Right 11/02/2019   Procedure: CYSTOSCOPY WITH RETROGRADE PYELOGRAM;  Surgeon: [name], MD;  Location: [facility];  Service: Urology;  Laterality: Right;   CYSTOSCOPY W/ RETROGRADES N/A 01/08/2022   Procedure: CYSTOSCOPY WITH RETROGRADE PYELOGRAM/ BLADDER BIOPSY;  Surgeon: [name], MD;  Location: [facility];  Service: Urology;  Laterality: N/A;   CYSTOSCOPY WITH BIOPSY N/A 11/02/2019   Procedure: CYSTOSCOPY WITH BIOPSY;  Surgeon: [name], MD;  Location: [facility];  Service: Urology;  Laterality: N/A;   CYSTOSCOPY/RETROGRADE/URETEROSCOPY/STONE EXTRACTION WITH BASKET Right 01/08/2022   Procedure: CYSTOSCOPY/RETROGRADE/URETEROSCOPY/STONE EXTRACTION WITH BASKET;  Surgeon: [name], MD;  Location: [facility];  Service: Urology;  Laterality: Right;   CYSTOSCOPY/URETEROSCOPY/HOLMIUM LASER/STENT PLACEMENT Right 12/29/2015   Procedure: CYSTOSCOPY/URETEROSCOPY/ with basketing of stone, right ureteral /STENT PLACEMENT;  Surgeon: [name], MD;  Location: [facility];  Service: Urology;  Laterality: Right;   CYSTOSCOPY/URETEROSCOPY/HOLMIUM LASER/STENT PLACEMENT Right 11/02/2019   Procedure: CYSTOSCOPY/URETEROSCOPY/HOLMIUM LASER/STENT PLACEMENT;  Surgeon: [name], MD;  Location: [facility];  Service: Urology;  Laterality: Right;   CYSTOSCOPY/URETEROSCOPY/HOLMIUM LASER/STENT PLACEMENT Right 01/08/2022   Procedure: CYSTOSCOPY/URETEROSCOPY/HOLMIUM LASER/STENT PLACEMENT;  Surgeon: [name], MD;  Location: [facility];  Service: Urology;  Laterality: Right;   TONSILLECTOMY       Current Outpatient Medications  Medication Sig Dispense Refill   aspirin EC 81 MG tablet Take 81 mg by mouth daily.     atorvastatin (LIPITOR) 80 MG tablet Take 80 mg  by mouth daily.     clindamycin (CLEOCIN) 150 MG capsule Take 150 mg by mouth 4 (four) times daily.     esomeprazole (NEXIUM) 20 MG capsule Take 20 mg by mouth daily at 12 noon.     hydrochlorothiazide (HYDRODIURIL) 12.5 MG tablet Take 12.5 mg by mouth daily.     lisinopril (PRINIVIL,ZESTRIL) 40 MG tablet Take 40 mg by mouth  daily.     metoprolol tartrate (LOPRESSOR) 50 MG tablet Take 50 mg (1 tablet) TWO hours prior to CT scan 1 tablet 0   Multiple Vitamins-Minerals (MULTIVITAMIN WITH MINERALS) tablet Take 1 tablet by mouth daily.     Omega-3 Fatty Acids (FISH OIL) 1000 MG CAPS Take 1,000 mg by mouth daily.     sildenafil (REVATIO) 20 MG tablet Take 20 mg by mouth daily as needed (ED).     No current facility-administered medications for this visit.    Allergies:   Glucosamine-chondroitin and Penicillins    Social History:  The patient  reports that he has never smoked. He has never been exposed to tobacco smoke. He has never used smokeless tobacco. He reports current alcohol use of about 1.0 standard drink of alcohol per week. He reports that he does not use drugs.   Family History:  The patient's family history includes AAA (abdominal aortic aneurysm) in his father; Hypertension in his father; Stroke in his mother.    ROS:  Please see the history of present illness.   Otherwise, review of systems are positive for none.   All other systems are reviewed and negative.    PHYSICAL EXAM: VS:  BP (!) 158/86   Pulse 66   Ht 6' (1.829 m)   Wt 210 lb (95.3 kg)   SpO2 96%   BMI 28.48 kg/m  , BMI Body mass index is 28.48 kg/m. GENERAL:  Well appearing HEENT:  Pupils equal round and reactive, fundi not visualized, oral mucosa unremarkable NECK:  No jugular venous distention, waveform within normal limits, carotid upstroke brisk and symmetric, no bruits, no thyromegaly LYMPHATICS:  No cervical, inguinal adenopathy LUNGS:  Clear to auscultation bilaterally BACK:  No CVA tenderness CHEST:  Unremarkable HEART:  PMI not displaced or sustained,S1 and S2 within normal limits, no S3, no S4, no clicks, no rubs, no murmurs ABD:  Flat, positive bowel sounds normal in frequency in pitch, no bruits, no rebound, no guarding, no midline pulsatile mass, no hepatomegaly, no splenomegaly EXT:  2 plus pulses throughout, no  edema, no cyanosis no clubbing SKIN:  No rashes no nodules NEURO:  Cranial nerves II through XII grossly intact, motor grossly intact throughout PSYCH:  Cognitively intact, oriented to person place and time    EKG:  EKG is ordered today. The ekg ordered today demonstrates sinus rhythm, rate 66, right bundle branch block, axis within normal limits, nonspecific inferior and lateral T wave changes.   Recent Labs: 01/07/2022: ALT 26 01/09/2022: BUN 29; Creatinine, Ser 1.41; Platelets 158; Potassium 3.7; Sodium 135 03/28/2022: Hemoglobin 13.5    Lipid Panel No results found for: "CHOL", "TRIG", "HDL", "CHOLHDL", "VLDL", "LDLCALC", "LDLDIRECT"    Wt Readings from Last 3 Encounters:  08/12/22 210 lb (95.3 kg)  08/01/22 197 lb (89.4 kg)  07/18/22 197 lb (89.4 kg)      Other studies Reviewed: Additional studies/ records that were reviewed today include: CT, labs. Review of the above records demonstrates:  Please see elsewhere in the note.     ASSESSMENT AND PLAN:  Chest pain:  The patient has chest pain which has some anginal but predominantly nonanginal features.  He does have a elevated coronary calcium score.  I will order a coronary CTA.  HTN: Blood pressures typically he said well controlled but I am going to have him keep a blood pressure diary.  Goal should be 120/70.  Dyslipidemia: LDL was 91 with an HDL of 62.  He was recently started on Lipitor switch from Mevacor.  I think her goal should be an LDL at least less than 70 and preferably in the 90s.  Right bundle branch block: He had an incomplete right bundle branch block previously earlier this year.  He has had no symptoms related to this.  No change in therapy.   Current medicines are reviewed at length with the patient today.  The patient does not have concerns regarding medicines.  The following changes have been made:  no change  Labs/ tests ordered today include:   Orders Placed This Encounter  Procedures   CT  CORONARY MORPH W/CTA COR W/SCORE W/CA W/CM &/OR WO/CM   Basic metabolic panel   EKG 35-DHRC     Disposition:   FU with me in 6 months or sooner based on the results of the above.   Signed, Minus Breeding, MD  08/12/2022 10:26 AM    [place]

## 2022-08-12 ENCOUNTER — Encounter: Payer: Self-pay | Admitting: Cardiology

## 2022-08-12 ENCOUNTER — Ambulatory Visit: Payer: Medicare PPO | Attending: Cardiovascular Disease | Admitting: Cardiology

## 2022-08-12 ENCOUNTER — Other Ambulatory Visit: Payer: Self-pay | Admitting: Physician Assistant

## 2022-08-12 VITALS — BP 158/86 | HR 66 | Ht 72.0 in | Wt 210.0 lb

## 2022-08-12 DIAGNOSIS — R931 Abnormal findings on diagnostic imaging of heart and coronary circulation: Secondary | ICD-10-CM | POA: Diagnosis not present

## 2022-08-12 DIAGNOSIS — I10 Essential (primary) hypertension: Secondary | ICD-10-CM | POA: Diagnosis not present

## 2022-08-12 DIAGNOSIS — N1330 Unspecified hydronephrosis: Secondary | ICD-10-CM

## 2022-08-12 LAB — BASIC METABOLIC PANEL
BUN/Creatinine Ratio: 20 (ref 10–24)
BUN: 25 mg/dL (ref 8–27)
CO2: 21 mmol/L (ref 20–29)
Calcium: 9.3 mg/dL (ref 8.6–10.2)
Chloride: 102 mmol/L (ref 96–106)
Creatinine, Ser: 1.23 mg/dL (ref 0.76–1.27)
Glucose: 110 mg/dL — ABNORMAL HIGH (ref 70–99)
Potassium: 4.1 mmol/L (ref 3.5–5.2)
Sodium: 139 mmol/L (ref 134–144)
eGFR: 61 mL/min/{1.73_m2} (ref >59)

## 2022-08-12 MED ORDER — METOPROLOL TARTRATE 50 MG PO TABS: ORAL_TABLET | ORAL | 0 refills | Status: DC

## 2022-08-12 NOTE — Patient Instructions (Addendum)
Medication Instructions:  Your physician recommends that you continue on your current medications as directed. Please refer to the Current Medication list given to you today.  *If you need a refill on your cardiac medications before your next appointment, please call your pharmacy*   Lab Work: BMET today  If you have labs (blood work) drawn today and your tests are completely normal, you will receive your results only by: [place] (if you have MyChart) OR A paper copy in the mail If you have any lab test that is abnormal or we need to change your treatment, we will call you to review the results.   Testing/Procedures: Coronary CTA-see instructions below  Follow-Up: At [facility], you and your health needs are our priority.  As part of our continuing mission to provide you with exceptional heart care, we have created designated Provider Care Teams.  These Care Teams include your primary Cardiologist (physician) and Advanced Practice Providers (APPs -  Physician Assistants and Nurse Practitioners) who all work together to provide you with the care you need, when you need it.  We recommend signing up for the patient portal called "MyChart".  Sign up information is provided on this After Visit Summary.  MyChart is used to connect with patients for Virtual Visits (Telemedicine).  Patients are able to view lab/test results, encounter notes, upcoming appointments, etc.  Non-urgent messages can be sent to your provider as well.   To learn more about what you can do with MyChart, go to [email or web address].    Your next appointment:   6 month(s)  The format for your next appointment:   In Person  Provider:   Dr. Percival Spanish Other Instructions   Your cardiac CT will be scheduled at one of the below locations:   [facility] [address] [place], [place] [address] [phone]  All radiology patients and guests should use entrance C2 at [facility], accessed from [facility], even though the hospital's physical address listed is [address].    Please follow these instructions carefully (unless otherwise directed):  Hold all erectile dysfunction medications at least 3 days (72 hrs) prior to test. (Ie viagra, cialis, sildenafil, tadalafil, etc) We will administer nitroglycerin during this exam.   On the Night Before the Test: Be sure to Drink plenty of water. Do not consume any caffeinated/decaffeinated beverages or chocolate 12 hours prior to your test. Do not take any antihistamines 12 hours prior to your test.  On the Day of the Test: Drink plenty of water until 1 hour prior to the test. Do not eat any food 1 hour prior to test. You may take your regular medications prior to the test.  Take metoprolol 50 mg (Lopressor) two hours prior to test. HOLD Hydrochlorothiazide morning of the test.      After the Test: Drink plenty of water. After receiving IV contrast, you may experience a mild flushed feeling. This is normal. On occasion, you may experience a mild rash up to 24 hours after the test. This is not dangerous. If this occurs, you can take Benadryl 25 mg and increase your fluid intake. If you experience trouble breathing, this can be serious. If it is severe call 911 IMMEDIATELY. If it is mild, please call our office. If you take any of these medications: Glipizide/Metformin, Avandament, Glucavance, please do not take 48 hours after completing test unless otherwise instructed.  We will call to schedule your test 2-4 weeks out  understanding that some insurance companies will need an authorization prior to the service being performed.   For non-scheduling related questions, please contact the cardiac imaging nurse navigator should you have any questions/concerns: Marchia Bond, Cardiac Imaging Nurse Navigator Gordy Clement, Cardiac Imaging Nurse Navigator [facility]  [facility] Direct Office Dial: [phone]   For scheduling needs, including cancellations and rescheduling, please call [place], [phone].

## 2022-08-28 ENCOUNTER — Telehealth (HOSPITAL_COMMUNITY): Payer: Self-pay | Admitting: *Deleted

## 2022-08-28 NOTE — Telephone Encounter (Signed)
Reaching out to patient to offer assistance regarding upcoming cardiac imaging study; pt verbalizes understanding of appt date/time, parking situation and where to check in, pre-test NPO status and medications ordered, and verified current allergies; name and call back number provided for further questions should they arise  Kathan Kirker RN Navigator Cardiac Imaging [facility] [phone] office [phone] cell  Patient to take 50mg metoprolol tartrate two hours prior to his cardiac CT scan.  

## 2022-08-29 ENCOUNTER — Ambulatory Visit: Payer: Medicare PPO

## 2022-08-29 ENCOUNTER — Other Ambulatory Visit: Payer: Self-pay | Admitting: Cardiology

## 2022-08-29 ENCOUNTER — Ambulatory Visit
Admission: RE | Admit: 2022-08-29 | Discharge: 2022-08-29 | Disposition: A | Discharge status: Home / Self Care | Payer: Medicare PPO | Source: Ambulatory Visit | Attending: Cardiology | Admitting: Cardiology

## 2022-08-29 DIAGNOSIS — I2510 Atherosclerotic heart disease of native coronary artery without angina pectoris: Secondary | ICD-10-CM

## 2022-08-29 DIAGNOSIS — R931 Abnormal findings on diagnostic imaging of heart and coronary circulation: Secondary | ICD-10-CM

## 2022-08-29 DIAGNOSIS — I77810 Thoracic aortic ectasia: Secondary | ICD-10-CM

## 2022-08-29 HISTORY — DX: Thoracic aortic ectasia: I77.810

## 2022-08-29 MED ORDER — NITROGLYCERIN 0.4 MG SL SUBL
0.8000 mg | SUBLINGUAL_TABLET | Freq: Once | SUBLINGUAL | Status: AC
  Administered 2022-08-29: 0.8 mg via SUBLINGUAL

## 2022-08-29 MED ORDER — IOHEXOL 350 MG/ML SOLN
75.0000 mL | Freq: Once | INTRAVENOUS | Status: AC | PRN
  Administered 2022-08-29: 75 mL via INTRAVENOUS

## 2022-08-29 MED ORDER — METOPROLOL TARTRATE 5 MG/5ML IV SOLN
5.0000 mg | Freq: Once | INTRAVENOUS | Status: AC
  Administered 2022-08-29: 5 mg via INTRAVENOUS

## 2022-08-29 NOTE — Progress Notes (Signed)
Patient tolerated procedure well. Ambulate w/o difficulty. Denies light headedness or being dizzy. Sitting in chair drinking water provided. Encouraged to drink extra water today and reasoning explained. Verbalized understanding. All questions answered. ABC intact. No further needs. Discharge from procedure area w/o issues.   °

## 2022-08-30 ENCOUNTER — Ambulatory Visit
Admission: RE | Admit: 2022-08-30 | Discharge: 2022-08-30 | Disposition: A | Discharge status: Home / Self Care | Payer: Medicare PPO | Source: Ambulatory Visit | Attending: Physician Assistant | Admitting: Physician Assistant

## 2022-08-30 DIAGNOSIS — N1330 Unspecified hydronephrosis: Secondary | ICD-10-CM | POA: Diagnosis present

## 2022-09-19 ENCOUNTER — Ambulatory Visit (LOCAL_COMMUNITY_HEALTH_CENTER): Payer: Medicare PPO

## 2022-09-19 DIAGNOSIS — Z719 Counseling, unspecified: Secondary | ICD-10-CM

## 2022-09-19 DIAGNOSIS — Z23 Encounter for immunization: Secondary | ICD-10-CM

## 2022-09-19 NOTE — Progress Notes (Signed)
  Are you feeling sick today? No   Have you ever received a dose of COVID-19 Vaccine? [employer], [place], [place], [place], Other) Yes  If yes, which vaccine and how many doses?   4 doses Pfizer   Did you bring the vaccination record card or other documentation?  Yes   Do you have a health condition or are undergoing treatment that makes you moderately or severely immunocompromised? This would include, but not be limited to: cancer, HIV, organ transplant, immunosuppressive therapy/high-dose corticosteroids, or moderate/severe primary immunodeficiency.  No  Have you received COVID-19 vaccine before or during hematopoietic cell transplant (HCT) or CAR-T-cell therapies? No  Have you ever had an allergic reaction to: (This would include a severe allergic reaction or a reaction that caused hives, swelling, or respiratory distress, including wheezing.) A component of a COVID-19 vaccine or a previous dose of COVID-19 vaccine? No   Have you ever had an allergic reaction to another vaccine (other thanCOVID-19 vaccine) or an injectable medication? (This would include a severe allergic reaction or a reaction that caused hives, swelling, or respiratory distress, including wheezing.)   No    Do you have a history of any of the following:  Myocarditis or Pericarditis No  Dermal fillers:  No  Multisystem Inflammatory Syndrome (MIS-C or MIS-A)? No  COVID-19 disease within the past 3 months? No  Vaccinated with monkeypox vaccine in the last 4 weeks? No  Comirnaty +12Y 2023-24 IM left deltoid. Tolerated well. VIS provided. [facility] updated and copy provided.  COVID card updated.  Waited 15 minutes.

## 2022-11-08 ENCOUNTER — Other Ambulatory Visit: Payer: Medicare PPO | Admitting: Urology

## 2022-11-11 ENCOUNTER — Other Ambulatory Visit: Payer: Medicare PPO | Admitting: Urology

## 2022-11-21 ENCOUNTER — Encounter: Payer: Self-pay | Admitting: Urology

## 2022-11-21 ENCOUNTER — Ambulatory Visit: Payer: Medicare PPO | Admitting: Urology

## 2022-11-21 VITALS — BP 145/84 | HR 66 | Ht 72.0 in | Wt 193.0 lb

## 2022-11-21 DIAGNOSIS — Z08 Encounter for follow-up examination after completed treatment for malignant neoplasm: Secondary | ICD-10-CM

## 2022-11-21 DIAGNOSIS — Z8551 Personal history of malignant neoplasm of bladder: Secondary | ICD-10-CM | POA: Diagnosis not present

## 2022-11-21 LAB — URINALYSIS, COMPLETE
Bilirubin, UA: NEGATIVE
Glucose, UA: NEGATIVE
Ketones, UA: NEGATIVE
Leukocytes,UA: NEGATIVE
Nitrite, UA: NEGATIVE
Protein,UA: NEGATIVE
RBC, UA: NEGATIVE
Specific Gravity, UA: 1.015 (ref 1.005–1.030)
Urobilinogen, Ur: 0.2 mg/dL (ref 0.2–1.0)
pH, UA: 5 (ref 5.0–7.5)

## 2022-11-21 LAB — MICROSCOPIC EXAMINATION: Bacteria, UA: NONE SEEN

## 2022-11-21 NOTE — Progress Notes (Signed)
   11/21/22  CC:  Chief Complaint  Patient presents with   Cysto    Urologic history: 1.  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  Uric acid nephrolithiasis Hypocitraturia, mild hyperuricemia and hyperuricosuria.  Urine pH 5.9 On potassium citrate Ureteroscopic stone removal 12/2015, 10/2019, 12/2021 Stone analysis 100% uric acid   HPI: 77 y.o. male presents for surveillance cystoscopy post induction BCG.  He has no complaints.  UA today negative microscopy  Blood pressure 129/77, pulse 75, height 6' (1.829 m), weight 197 lb (89.4 kg). NED. A&Ox3.   No respiratory distress   Abd soft, NT, ND Normal phallus with bilateral descended testicles  Cystoscopy Procedure Note  Patient identification was confirmed, informed consent was obtained, and patient was prepped using Betadine solution.  Lidocaine jelly was administered per urethral meatus.     Pre-Procedure: - Inspection reveals a normal caliber urethral meatus.  Procedure: The flexible cystoscope was introduced without difficulty - No urethral strictures/lesions are present. - Moderate lateral lobe enlargement prostate  - Normal bladder neck - Bilateral ureteral orifices identified - Bladder mucosa  reveals no ulcers, tumors, or lesions - No bladder stones - Mild trabeculation  Retroflexion shows no intravesical median lobe or tumor   Post-Procedure: - Patient tolerated the procedure well  Assessment/ Plan: No evidence recurrent urothelial carcinoma Follow-up  surveillance cystoscopy in 3 months 3 week maintenance BCG May 2024    [name], MD

## 2023-01-28 NOTE — Progress Notes (Unsigned)
BCG Bladder Instillation  BCG # 1  Due to Bladder Cancer patient is present today for a BCG treatment. Patient was cleaned and prepped in a sterile fashion with betadine. A 14 FR catheter was inserted, urine return was noted 50 ml, urine was yellow in color.  50ml of reconstituted BCG was instilled into the bladder. The catheter was then removed. Patient tolerated well, no complications were noted  Performed by: Michiel Cowboy, PA-C and Mammie Lorenzo, RN  Follow up/ Additional notes: In one week for #2/3 bcg and in June for cysto

## 2023-01-29 ENCOUNTER — Ambulatory Visit: Payer: Medicare PPO | Admitting: Urology

## 2023-01-29 DIAGNOSIS — C679 Malignant neoplasm of bladder, unspecified: Secondary | ICD-10-CM

## 2023-01-29 DIAGNOSIS — Z8551 Personal history of malignant neoplasm of bladder: Secondary | ICD-10-CM

## 2023-01-29 LAB — URINALYSIS, COMPLETE
Bilirubin, UA: NEGATIVE
Glucose, UA: NEGATIVE
Ketones, UA: NEGATIVE
Leukocytes,UA: NEGATIVE
Nitrite, UA: NEGATIVE
Protein,UA: NEGATIVE
RBC, UA: NEGATIVE
Specific Gravity, UA: 1.02 (ref 1.005–1.030)
Urobilinogen, Ur: 0.2 mg/dL (ref 0.2–1.0)
pH, UA: 5 (ref 5.0–7.5)

## 2023-01-29 LAB — MICROSCOPIC EXAMINATION

## 2023-01-29 MED ORDER — BCG LIVE 50 MG IS SUSR
3.2400 mL | Freq: Once | INTRAVESICAL | Status: AC
  Administered 2023-01-29: 81 mg via INTRAVESICAL

## 2023-01-30 ENCOUNTER — Ambulatory Visit: Payer: Medicare PPO | Admitting: Physician Assistant

## 2023-02-05 ENCOUNTER — Encounter: Payer: Self-pay | Admitting: Physician Assistant

## 2023-02-05 ENCOUNTER — Ambulatory Visit: Payer: Medicare PPO | Admitting: Physician Assistant

## 2023-02-05 DIAGNOSIS — C679 Malignant neoplasm of bladder, unspecified: Secondary | ICD-10-CM

## 2023-02-05 DIAGNOSIS — Z8551 Personal history of malignant neoplasm of bladder: Secondary | ICD-10-CM

## 2023-02-05 LAB — URINALYSIS, COMPLETE
Bilirubin, UA: NEGATIVE
Glucose, UA: NEGATIVE
Ketones, UA: NEGATIVE
Leukocytes,UA: NEGATIVE
Nitrite, UA: NEGATIVE
Protein,UA: NEGATIVE
RBC, UA: NEGATIVE
Specific Gravity, UA: 1.025 (ref 1.005–1.030)
Urobilinogen, Ur: 0.2 mg/dL (ref 0.2–1.0)
pH, UA: 5.5 (ref 5.0–7.5)

## 2023-02-05 LAB — MICROSCOPIC EXAMINATION

## 2023-02-05 MED ORDER — BCG LIVE 50 MG IS SUSR
3.2400 mL | Freq: Once | INTRAVESICAL | Status: AC
  Administered 2023-02-05: 81 mg via INTRAVESICAL

## 2023-02-05 NOTE — Progress Notes (Signed)
BCG Bladder Instillation  BCG # 2 of 3  Due to Bladder Cancer patient is present today for a BCG treatment. Patient was cleaned and prepped in a sterile fashion with betadine. A 14FR catheter was inserted, urine return was noted 25ml, urine was yellow in color.  50ml of reconstituted BCG was instilled into the bladder. The catheter was then removed. Patient tolerated well, no complications were noted  Performed by: Carman Ching, PA-C and Wynona Dove, RN  Follow up/ Additional notes: 1 week for BCG #3

## 2023-02-06 ENCOUNTER — Ambulatory Visit: Payer: Medicare PPO | Admitting: Physician Assistant

## 2023-02-08 DIAGNOSIS — E785 Hyperlipidemia, unspecified: Secondary | ICD-10-CM | POA: Insufficient documentation

## 2023-02-08 DIAGNOSIS — I25110 Atherosclerotic heart disease of native coronary artery with unstable angina pectoris: Secondary | ICD-10-CM | POA: Insufficient documentation

## 2023-02-08 DIAGNOSIS — I2510 Atherosclerotic heart disease of native coronary artery without angina pectoris: Secondary | ICD-10-CM | POA: Insufficient documentation

## 2023-02-08 NOTE — Progress Notes (Unsigned)
  Cardiology Office Note:   Date:  02/11/2023  ID:  Andre Stone, Andre Stone 09/26/1945, MRN [id number]  History of Present Illness:   Andre Stone is a 77 y.o. male  who presents for evaluation of elevated coronary calcium.  He having chest discomfort which is why he had the coronary calcium score ordered.  His only past cardiac history was stress perfusion study in 2019 for evaluation of chest discomfort.  He had a well-preserved ejection fraction of 62% and no ischemia.  I reviewed these records for this visit.  His calcium score done recently was 937 with 3 vessels demonstrating calcium.  He was 77th percentile.  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    Since I last saw him he has done well.  The patient denies any new symptoms such as chest discomfort, neck or arm discomfort. There has been no new shortness of breath, PND or orthopnea. There have been no reported palpitations, presyncope or syncope.   ROS: Positive for hip pain.  Otherwise as stated in the HPI and negative for all other systems.  Studies Reviewed:    EKG:  NA    Risk Assessment/Calculations:              Physical Exam:   VS:  BP 118/64   Pulse 67   Ht 6' (1.829 m)   Wt 197 lb (89.4 kg)   SpO2 97%   BMI 26.72 kg/m    Wt Readings from Last 3 Encounters:  02/11/23 197 lb (89.4 kg)  11/21/22 193 lb (87.5 kg)  08/12/22 210 lb (95.3 kg)     GEN: Well nourished, well developed in no acute distress NECK: No JVD; No carotid bruits CARDIAC: RRR, no murmurs, rubs, gallops RESPIRATORY:  Clear to auscultation without rales, wheezing or rhonchi  ABDOMEN: Soft, non-tender, non-distended EXTREMITIES:  No edema; No deformity   ASSESSMENT AND PLAN:   CAD: The patient has no new sypmtoms.  No further cardiovascular testing is indicated.  We will continue with aggressive risk reduction and meds as listed.   HTN: Blood pressures at target.  No change in therapy.    Dyslipidemia: LDL was 39.  No change in therapy.  We will give him instructions to get an LPa at the next lab draw with his PCP.     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  No change in therapy.  Sleep apnea:   He does have fatigue.  I asked him to discuss this with his primary care physician to see if he needs any further evaluation of his CPAP.        Signed, Rollene Rotunda, MD

## 2023-02-10 ENCOUNTER — Ambulatory Visit: Payer: Medicare PPO | Admitting: Cardiology

## 2023-02-11 ENCOUNTER — Encounter: Payer: Self-pay | Admitting: Cardiology

## 2023-02-11 ENCOUNTER — Ambulatory Visit: Payer: Medicare PPO | Attending: Cardiology | Admitting: Cardiology

## 2023-02-11 VITALS — BP 118/64 | HR 67 | Ht 72.0 in | Wt 197.0 lb

## 2023-02-11 DIAGNOSIS — I2510 Atherosclerotic heart disease of native coronary artery without angina pectoris: Secondary | ICD-10-CM | POA: Diagnosis not present

## 2023-02-11 DIAGNOSIS — E785 Hyperlipidemia, unspecified: Secondary | ICD-10-CM

## 2023-02-11 DIAGNOSIS — I10 Essential (primary) hypertension: Secondary | ICD-10-CM | POA: Diagnosis not present

## 2023-02-11 NOTE — Patient Instructions (Signed)
Medication Instructions:  No Changes In Medications at this time.   *If you need a refill on your cardiac medications before your next appointment, please call your pharmacy*  Follow-Up: At [facility], you and your health needs are our priority.  As part of our continuing mission to provide you with exceptional heart care, we have created designated Provider Care Teams.  These Care Teams include your primary Cardiologist (physician) and Advanced Practice Providers (APPs -  Physician Assistants and Nurse Practitioners) who all work together to provide you with the care you need, when you need it.  Your next appointment:   1 year(s)  Provider:   Rollene Rotunda, MD

## 2023-02-13 ENCOUNTER — Ambulatory Visit: Payer: Medicare PPO | Admitting: Physician Assistant

## 2023-02-13 VITALS — BP 122/69 | HR 80 | Ht 72.0 in | Wt 195.4 lb

## 2023-02-13 DIAGNOSIS — C679 Malignant neoplasm of bladder, unspecified: Secondary | ICD-10-CM

## 2023-02-13 LAB — URINALYSIS, COMPLETE
Bilirubin, UA: NEGATIVE
Glucose, UA: NEGATIVE
Ketones, UA: NEGATIVE
Leukocytes,UA: NEGATIVE
Nitrite, UA: NEGATIVE
Protein,UA: NEGATIVE
RBC, UA: NEGATIVE
Specific Gravity, UA: 1.025 (ref 1.005–1.030)
Urobilinogen, Ur: 0.2 mg/dL (ref 0.2–1.0)
pH, UA: 5.5 (ref 5.0–7.5)

## 2023-02-13 LAB — MICROSCOPIC EXAMINATION

## 2023-02-13 MED ORDER — BCG LIVE 50 MG IS SUSR
3.2400 mL | Freq: Once | INTRAVESICAL | Status: AC
  Administered 2023-02-13: 81 mg via INTRAVESICAL

## 2023-02-13 NOTE — Progress Notes (Signed)
BCG Bladder Instillation  BCG # 3 of 3  Due to Bladder Cancer patient is present today for a BCG treatment. Patient was cleaned and prepped in a sterile fashion with betadine. A 14FR catheter was inserted, urine return was noted 20ml, urine was yellow in color.  50ml of reconstituted BCG was instilled into the bladder. The catheter was then removed. Patient tolerated well, no complications were noted.  Performed by: Carman Ching, PA-C and Domingo Cocking, CMA  Follow up: Return in about 6 weeks (around 03/27/2023) for Cysto with Dr. Lonna Cobb.

## 2023-02-19 ENCOUNTER — Other Ambulatory Visit: Payer: Medicare PPO | Admitting: Urology

## 2023-02-26 ENCOUNTER — Other Ambulatory Visit: Payer: Medicare PPO | Admitting: Urology

## 2023-04-02 ENCOUNTER — Encounter: Payer: Self-pay | Admitting: Urology

## 2023-04-02 ENCOUNTER — Ambulatory Visit: Payer: Medicare PPO | Admitting: Urology

## 2023-04-02 VITALS — BP 157/85 | HR 72 | Ht 72.0 in | Wt 195.0 lb

## 2023-04-02 DIAGNOSIS — Z08 Encounter for follow-up examination after completed treatment for malignant neoplasm: Secondary | ICD-10-CM | POA: Diagnosis not present

## 2023-04-02 DIAGNOSIS — Z8551 Personal history of malignant neoplasm of bladder: Secondary | ICD-10-CM | POA: Diagnosis not present

## 2023-04-02 DIAGNOSIS — N1330 Unspecified hydronephrosis: Secondary | ICD-10-CM

## 2023-04-02 DIAGNOSIS — C679 Malignant neoplasm of bladder, unspecified: Secondary | ICD-10-CM

## 2023-04-02 LAB — MICROSCOPIC EXAMINATION

## 2023-04-02 LAB — URINALYSIS, COMPLETE
Bilirubin, UA: NEGATIVE
Glucose, UA: NEGATIVE
Ketones, UA: NEGATIVE
Leukocytes,UA: NEGATIVE
Nitrite, UA: NEGATIVE
Protein,UA: NEGATIVE
RBC, UA: NEGATIVE
Specific Gravity, UA: 1.02 (ref 1.005–1.030)
Urobilinogen, Ur: 0.2 mg/dL (ref 0.2–1.0)
pH, UA: 5.5 (ref 5.0–7.5)

## 2023-04-02 NOTE — Progress Notes (Signed)
   04/02/23  CC:  Chief Complaint  Patient presents with   Cysto    Urologic history: 1.  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  Uric acid nephrolithiasis Hypocitraturia, mild hyperuricemia and hyperuricosuria.  Urine pH 5.9 On potassium citrate Ureteroscopic stone removal 12/2015, 10/2019, 12/2021 Stone analysis 100% uric acid   HPI: 77 y.o. Stone presents for surveillance cystoscopy post induction BCG.  He has no complaints.  UA today negative microscopy  Blood pressure 129/77, pulse 75, height 6' (1.829 m), weight 197 lb (89.4 kg). NED. A&Ox3.   No respiratory distress   Abd soft, NT, ND Normal phallus with bilateral descended testicles  Cystoscopy Procedure Note  Patient identification was confirmed, informed consent was obtained, and patient was prepped using Betadine solution.  Lidocaine jelly was administered per urethral meatus.     Pre-Procedure: - Inspection reveals a normal caliber urethral meatus.  Procedure: The flexible cystoscope was introduced without difficulty - No urethral strictures/lesions are present. - Moderate lateral lobe enlargement prostate; moderate median lobe - Normal bladder neck - Bilateral ureteral orifices identified - Bladder mucosa  reveals no ulcers, tumors, or lesions - No bladder stones - Mild trabeculation  Retroflexion shows no intravesical median lobe or tumor   Post-Procedure: - Patient tolerated the procedure well  Assessment/ Plan: No evidence recurrent  urothelial carcinoma Follow-up surveillance cystoscopy early November 2024 3 week maintenance BCG late November 2024 RUS December 2024 showed mild left hydronephrosis; schedule follow-up RUS    Riki Altes, MD

## 2023-04-02 NOTE — Addendum Note (Signed)
Addended by: Honor Loh on: 04/02/2023 02:00 PM   Modules accepted: Orders

## 2023-04-17 ENCOUNTER — Ambulatory Visit
Admission: RE | Admit: 2023-04-17 | Discharge: 2023-04-17 | Disposition: A | Discharge status: Home / Self Care | Payer: Medicare PPO | Source: Ambulatory Visit | Attending: Urology | Admitting: Urology

## 2023-04-17 DIAGNOSIS — N1330 Unspecified hydronephrosis: Secondary | ICD-10-CM

## 2023-04-28 ENCOUNTER — Encounter: Payer: Self-pay | Admitting: Urology

## 2023-04-28 ENCOUNTER — Other Ambulatory Visit: Payer: Self-pay | Admitting: Urology

## 2023-04-28 DIAGNOSIS — N1330 Unspecified hydronephrosis: Secondary | ICD-10-CM

## 2023-05-02 ENCOUNTER — Telehealth: Payer: Self-pay | Admitting: *Deleted

## 2023-05-02 NOTE — Telephone Encounter (Signed)
   Pre-operative Risk Assessment    Patient Name: Andre Stone  DOB: [birth year] MRN: [id number]      Request for Surgical Clearance    Procedure:   RIGHT THA  Date of Surgery:  Clearance 07/07/23                                 Surgeon:  DR. Reinaldo Berber Surgeon's Group or Practice Name:  [facility]  Phone number:  [phone] Fax number:  [phone]   Type of Clearance Requested:   - Medical ; ASA    Type of Anesthesia:  Not Indicated (GENERAL?)   Additional requests/questions:    Elpidio Anis   05/02/2023, 5:31 PM

## 2023-05-05 ENCOUNTER — Telehealth: Payer: Self-pay | Admitting: *Deleted

## 2023-05-05 NOTE — Telephone Encounter (Signed)
I s/w the pt and he has been scheduled for tele pre op appt 06/13/23 @ 9 am. Med rec and consent are done.      Patient Consent for Virtual Visit        Andre Stone has provided verbal consent on 05/05/2023 for a virtual visit (video or telephone).   CONSENT FOR VIRTUAL VISIT FOR:  Andre Stone  By participating in this virtual visit I agree to the following:  I hereby voluntarily request, consent and authorize [facility] and its employed or contracted physicians, physician assistants, nurse practitioners or other licensed health care professionals (the Practitioner), to provide me with telemedicine health care services (the "Services") as deemed necessary by the treating Practitioner. I acknowledge and consent to receive the Services by the Practitioner via telemedicine. I understand that the telemedicine visit will involve communicating with the Practitioner through live audiovisual communication technology and the disclosure of certain medical information by electronic transmission. I acknowledge that I have been given the opportunity to request an in-person assessment or other available alternative prior to the telemedicine visit and am voluntarily participating in the telemedicine visit.  I understand that I have the right to withhold or withdraw my consent to the use of telemedicine in the course of my care at any time, without affecting my right to future care or treatment, and that the Practitioner or I may terminate the telemedicine visit at any time. I understand that I have the right to inspect all information obtained and/or recorded in the course of the telemedicine visit and may receive copies of available information for a reasonable fee.  I understand that some of the potential risks of receiving the Services via telemedicine include:  Delay or interruption in medical evaluation due to technological equipment failure or disruption; Information transmitted may  not be sufficient (e.g. poor resolution of images) to allow for appropriate medical decision making by the Practitioner; and/or  In rare instances, security protocols could fail, causing a breach of personal health information.  Furthermore, I acknowledge that it is my responsibility to provide information about my medical history, conditions and care that is complete and accurate to the best of my ability. I acknowledge that Practitioner's advice, recommendations, and/or decision may be based on factors not within their control, such as incomplete or inaccurate data provided by me or distortions of diagnostic images or specimens that may result from electronic transmissions. I understand that the practice of medicine is not an exact science and that Practitioner makes no warranties or guarantees regarding treatment outcomes. I acknowledge that a copy of this consent can be made available to me via my patient portal [facility] MyChart), or I can request a printed copy by calling the office of [facility].    I understand that my insurance will be billed for this visit.   I have read or had this consent read to me. I understand the contents of this consent, which adequately explains the benefits and risks of the Services being provided via telemedicine.  I have been provided ample opportunity to ask questions regarding this consent and the Services and have had my questions answered to my satisfaction. I give my informed consent for the services to be provided through the use of telemedicine in my medical care

## 2023-05-05 NOTE — Telephone Encounter (Signed)
   Name: KAYSE LOSEE  DOB: 20-Aug-1946  MRN: [id number]  Primary Cardiologist: Rollene Rotunda, MD  Chart reviewed as part of pre-operative protocol coverage. Because of Alphonso Mayoral President's past medical history and time since last visit, he will require a follow-up telephone visit in order to better assess preoperative cardiovascular risk.  Pre-op covering staff: - Please schedule appointment and call patient to inform them. If patient already had an upcoming appointment within acceptable timeframe, please add "pre-op clearance" to the appointment notes so provider is aware. - Please contact requesting surgeon's office via preferred method (i.e, phone, fax) to inform them of need for appointment prior to surgery.  Okay to hold ASA x 5-7 days as long as no symptoms at the time of phone call.  Sharlene Dory, PA-C  05/05/2023, 7:17 AM

## 2023-05-05 NOTE — Telephone Encounter (Signed)
I s/w the pt and he has been scheduled for tele pre op appt 06/13/23 @ 9 am. Med rec and consent are done.

## 2023-05-12 ENCOUNTER — Other Ambulatory Visit: Payer: Self-pay | Admitting: Orthopedic Surgery

## 2023-05-14 ENCOUNTER — Ambulatory Visit
Admission: RE | Admit: 2023-05-14 | Discharge: 2023-05-14 | Disposition: A | Discharge status: Home / Self Care | Payer: Medicare PPO | Source: Ambulatory Visit | Attending: Urology | Admitting: Urology

## 2023-05-14 DIAGNOSIS — N1330 Unspecified hydronephrosis: Secondary | ICD-10-CM | POA: Diagnosis not present

## 2023-06-12 NOTE — Progress Notes (Signed)
Virtual Visit via Telephone Note   Because of Andre Stone's co-morbid illnesses, he is at least at moderate risk for complications without adequate follow up.  This format is felt to be most appropriate for this patient at this time.  The patient did not have access to video technology/had technical difficulties with video requiring transitioning to audio format only (telephone).  All issues noted in this document were discussed and addressed.  No physical exam could be performed with this format.  Please refer to the patient's chart for his consent to telehealth for [facility].  Evaluation Performed:  Preoperative cardiovascular risk assessment _____________   Date:  06/13/2023   Patient ID:  ULIS STREETS, DOB 05/25/1946, MRN [id number] Patient Location:  Home Provider location:   Office  Primary Care Provider:  Marisue Ivan, MD Primary Cardiologist:  Rollene Rotunda, MD  Chief Complaint / Patient Profile   77 y.o. y/o male with a h/o nonobstructive CAD per coronary CTA, hypertension, hyperlipidemia, OSA, GERD who is pending right total hip arthroplasty by Dr. Audelia Acton and presents today for telephonic preoperative cardiovascular risk assessment.    History of Present Illness    Andre Stone is a 77 y.o. male who presents via audio/video conferencing for a telehealth visit today.  Pt was last seen in cardiology clinic on 02/11/2023 by Dr. Antoine Poche.  At that time Andre Stone was stable from a cardiac standpoint.  The patient is now pending procedure as outlined above. Since his last visit, he is doing well. Patient denies shortness of breath, dyspnea on exertion, lower extremity edema, orthopnea or PND. No chest pain, pressure, or tightness. No palpitations.  He remains active going to the fitness center a few days a week to use the elliptical and perform other exercises.  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   Community acquired pneumonia 12/25/2015   Essential (primary) hypertension 12/28/2015   Gastro-esophageal reflux disease without esophagitis 12/28/2015   History of kidney stones    Hypertension    Past Surgical History:  Procedure Laterality Date   CHOLECYSTECTOMY     CYSTOSCOPY W/ RETROGRADES Right 11/02/2019   Procedure: CYSTOSCOPY WITH RETROGRADE PYELOGRAM;  Surgeon: Riki Altes, MD;  Location: [facility] OR[facility];  Service: Urology;  Laterality: Right;   CYSTOSCOPY W/ RETROGRADES N/A 01/08/2022   Procedure: CYSTOSCOPY WITH RETROGRADE PYELOGRAM/ BLADDER BIOPSY;  Surgeon: Riki Altes, MD;  Location: [facility] OR[facility];  Service: Urology;  Laterality: N/A;   CYSTOSCOPY WITH BIOPSY N/A 11/02/2019   Procedure: CYSTOSCOPY WITH BIOPSY;  Surgeon: Riki Altes, MD;  Location: [facility];  Service: Urology;  Laterality: N/A;   CYSTOSCOPY/RETROGRADE/URETEROSCOPY/STONE EXTRACTION WITH BASKET Right 01/08/2022   Procedure: CYSTOSCOPY/RETROGRADE/URETEROSCOPY/STONE EXTRACTION WITH BASKET;  Surgeon: Riki Altes, MD;  Location: [facility];  Service: Urology;  Laterality: Right;   CYSTOSCOPY/URETEROSCOPY/HOLMIUM LASER/STENT PLACEMENT Right 12/29/2015   Procedure: CYSTOSCOPY/URETEROSCOPY/ with basketing of stone, right ureteral /STENT PLACEMENT;  Surgeon: Sebastian Ache, MD;  Location: [facility];  Service: Urology;  Laterality: Right;   CYSTOSCOPY/URETEROSCOPY/HOLMIUM LASER/STENT PLACEMENT Right 11/02/2019   Procedure: CYSTOSCOPY/URETEROSCOPY/HOLMIUM LASER/STENT PLACEMENT;  Surgeon: Riki Altes, MD;  Location: [facility];  Service: Urology;  Laterality: Right;   CYSTOSCOPY/URETEROSCOPY/HOLMIUM LASER/STENT PLACEMENT Right 01/08/2022   Procedure: CYSTOSCOPY/URETEROSCOPY/HOLMIUM LASER/STENT PLACEMENT;  Surgeon: Riki Altes, MD;  Location: [facility];  Service: Urology;  Laterality: Right;   TONSILLECTOMY  Allergies  Allergies  Allergen Reactions    Glucosamine-Chondroitin Swelling    Tongue swelling   Penicillins Rash and Other (See Comments)    Childhood allergy.  Has patient had a PCN reaction causing immediate rash, facial/tongue/throat swelling, SOB or lightheadedness with hypotension: no Has patient had a PCN reaction causing severe rash involving mucus membranes or skin necrosis: no Has patient had a PCN reaction that required hospitalization no Has patient had a PCN reaction occurring within the last 10 years: no If all of the above answers are "NO", then may proceed with Cephalosporin use.     Home Medications    Prior to Admission medications   Medication Sig Start Date End Date Taking? Authorizing Provider  aspirin EC 81 MG tablet Take 81 mg by mouth daily.    [provider]  atorvastatin (LIPITOR) 80 MG tablet Take 80 mg by mouth daily. 06/11/22   [provider]  Cholecalciferol 25 MCG (1000 UT) capsule Take by mouth.    [provider]  esomeprazole (NEXIUM) 20 MG capsule Take 20 mg by mouth daily at 12 noon.    [provider]  hydrochlorothiazide (HYDRODIURIL) 12.5 MG tablet Take 12.5 mg by mouth daily. 10/05/21   [provider]  lisinopril (PRINIVIL,ZESTRIL) 40 MG tablet Take 40 mg by mouth daily.    [provider]  Multiple Vitamins-Minerals (MULTIVITAMIN WITH MINERALS) tablet Take 1 tablet by mouth daily.    [provider]  Omega-3 Fatty Acids (FISH OIL) 1000 MG CAPS Take 1,000 mg by mouth daily.    [provider]  sildenafil (REVATIO) 20 MG tablet Take 20 mg by mouth daily as needed (ED).    [provider]  traZODone (DESYREL) 150 MG tablet Take 150 mg by mouth at bedtime as needed.    [provider]    Physical Exam    Vital Signs:  Andre Stone does not have vital signs available for review today.  Given telephonic nature of communication, physical exam is limited. AAOx3. NAD. Normal affect.  Speech and  respirations are unlabored.  Accessory Clinical Findings    None  Assessment & Plan    Primary Cardiologist: Rollene Rotunda, MD  Preoperative cardiovascular risk assessment. Right total hip arthroplasty with Dr. Audelia Acton.  Chart reviewed as part of pre-operative protocol coverage. According to the RCRI, patient has a 0.4% risk of MACE. Patient reports activity equivalent to >4.0 METS (goes to the fitness center a few days a week to use the elliptical and do other exercises).   Given past medical history and time since last visit, based on ACC/AHA guidelines, MICAH OLIVOS would be at acceptable risk for the planned procedure without further cardiovascular testing.   Patient was advised that if he develops new symptoms prior to surgery to contact our office to arrange a follow-up appointment.  he verbalized understanding.  Ideally aspirin should be continued without interruption, however if the bleeding risk is too great, aspirin may be held for 5-7 days prior to surgery. Please resume aspirin post operatively when it is felt to be safe from a bleeding standpoint.    I will route this recommendation to the requesting party via Epic fax function.  Please call with questions.  Time:   Today, I have spent 6 minutes with the patient with telehealth technology discussing medical history, symptoms, and management plan.     Carlos Levering, NP  06/13/2023, 9:38 AM

## 2023-06-13 ENCOUNTER — Ambulatory Visit: Payer: Medicare PPO | Attending: Cardiovascular Disease | Admitting: Student

## 2023-06-13 DIAGNOSIS — Z01810 Encounter for preprocedural cardiovascular examination: Secondary | ICD-10-CM

## 2023-06-17 ENCOUNTER — Other Ambulatory Visit: Payer: Self-pay | Admitting: Orthopedic Surgery

## 2023-06-24 ENCOUNTER — Other Ambulatory Visit: Payer: Medicare PPO

## 2023-07-04 ENCOUNTER — Other Ambulatory Visit: Payer: Medicare PPO

## 2023-07-08 ENCOUNTER — Encounter
Admission: RE | Admit: 2023-07-08 | Discharge: 2023-07-08 | Disposition: A | Discharge status: Home / Self Care | Payer: Medicare PPO | Source: Ambulatory Visit | Attending: Orthopedic Surgery | Admitting: Orthopedic Surgery

## 2023-07-08 ENCOUNTER — Other Ambulatory Visit: Payer: Self-pay

## 2023-07-08 DIAGNOSIS — Z01818 Encounter for other preprocedural examination: Secondary | ICD-10-CM | POA: Diagnosis present

## 2023-07-08 DIAGNOSIS — Z01810 Encounter for preprocedural cardiovascular examination: Secondary | ICD-10-CM | POA: Diagnosis not present

## 2023-07-08 DIAGNOSIS — Z01812 Encounter for preprocedural laboratory examination: Secondary | ICD-10-CM

## 2023-07-08 HISTORY — DX: Obstructive sleep apnea (adult) (pediatric): G47.33

## 2023-07-08 HISTORY — DX: Prediabetes: R73.03

## 2023-07-08 HISTORY — DX: Mixed hyperlipidemia: E78.2

## 2023-07-08 HISTORY — DX: Atherosclerotic heart disease of native coronary artery with unspecified angina pectoris: I25.119

## 2023-07-08 HISTORY — DX: Acute myocardial infarction, unspecified: I21.9

## 2023-07-08 HISTORY — DX: Unspecified cataract: H26.9

## 2023-07-08 HISTORY — DX: Malignant neoplasm of bladder, unspecified: C67.9

## 2023-07-08 HISTORY — DX: Diverticulitis of large intestine without perforation or abscess without bleeding: K57.32

## 2023-07-08 HISTORY — DX: Anemia, unspecified: D64.9

## 2023-07-08 LAB — COMPREHENSIVE METABOLIC PANEL
ALT: 39 U/L (ref 0–44)
AST: 36 U/L (ref 15–41)
Albumin: 4.6 g/dL (ref 3.5–5.0)
Alkaline Phosphatase: 62 U/L (ref 38–126)
Anion gap: 13 (ref 5–15)
BUN: 23 mg/dL (ref 8–23)
CO2: 25 mmol/L (ref 22–32)
Calcium: 9.6 mg/dL (ref 8.9–10.3)
Chloride: 99 mmol/L (ref 98–111)
Creatinine, Ser: 1.29 mg/dL — ABNORMAL HIGH (ref 0.61–1.24)
GFR, Estimated: 57 mL/min — ABNORMAL LOW (ref >60)
Glucose, Bld: 110 mg/dL — ABNORMAL HIGH (ref 70–99)
Potassium: 4 mmol/L (ref 3.5–5.1)
Sodium: 137 mmol/L (ref 135–145)
Total Bilirubin: 0.9 mg/dL (ref 0.3–1.2)
Total Protein: 7.9 g/dL (ref 6.5–8.1)

## 2023-07-08 LAB — CBC WITH DIFFERENTIAL/PLATELET
Abs Immature Granulocytes: 0.02 10*3/uL (ref 0.00–0.07)
Basophils Absolute: 0 10*3/uL (ref 0.0–0.1)
Basophils Relative: 1 %
Eosinophils Absolute: 0.1 10*3/uL (ref 0.0–0.5)
Eosinophils Relative: 1 %
HCT: 43 % (ref 39.0–52.0)
Hemoglobin: 14.9 g/dL (ref 13.0–17.0)
Immature Granulocytes: 0 %
Lymphocytes Relative: 30 %
Lymphs Abs: 1.8 10*3/uL (ref 0.7–4.0)
MCH: 30 pg (ref 26.0–34.0)
MCHC: 34.7 g/dL (ref 30.0–36.0)
MCV: 86.7 fL (ref 80.0–100.0)
Monocytes Absolute: 0.6 10*3/uL (ref 0.1–1.0)
Monocytes Relative: 10 %
Neutro Abs: 3.6 10*3/uL (ref 1.7–7.7)
Neutrophils Relative %: 58 %
Platelets: 174 10*3/uL (ref 150–400)
RBC: 4.96 MIL/uL (ref 4.22–5.81)
RDW: 13.2 % (ref 11.5–15.5)
WBC: 6.1 10*3/uL (ref 4.0–10.5)
nRBC: 0 % (ref 0.0–0.2)

## 2023-07-08 LAB — SURGICAL PCR SCREEN
MRSA, PCR: NEGATIVE
Staphylococcus aureus: NEGATIVE

## 2023-07-08 LAB — URINALYSIS, ROUTINE W REFLEX MICROSCOPIC
Bilirubin Urine: NEGATIVE
Glucose, UA: NEGATIVE mg/dL
Hgb urine dipstick: NEGATIVE
Ketones, ur: NEGATIVE mg/dL
Leukocytes,Ua: NEGATIVE
Nitrite: NEGATIVE
Protein, ur: NEGATIVE mg/dL
Specific Gravity, Urine: 1.013 (ref 1.005–1.030)
pH: 6 (ref 5.0–8.0)

## 2023-07-08 LAB — TYPE AND SCREEN
ABO/RH(D): B POS
Antibody Screen: NEGATIVE

## 2023-07-08 NOTE — Patient Instructions (Addendum)
Your procedure is scheduled on: Thursday, October 24 Report to the Registration Desk on the 1st floor of the [facility] To find out your arrival time, please call [phone] between 1PM - 3PM on: Wednesday, October 23 If your arrival time is 6:00 am, do not arrive before that time as the [facility] entrance doors do not open until 6:00 am.  REMEMBER: Instructions that are not followed completely may result in serious medical risk, up to and including death; or upon the discretion of your surgeon and anesthesiologist your surgery may need to be rescheduled.  Do not eat food after midnight the night before surgery.  No gum chewing or hard candies.  You may however, drink CLEAR liquids up to 2 hours before you are scheduled to arrive for your surgery. Do not drink anything within 2 hours of your scheduled arrival time.  Clear liquids include: - water  - apple juice without pulp - gatorade (not RED colors) - black coffee or tea (Do NOT add milk or creamers to the coffee or tea) Do NOT drink anything that is not on this list.  In addition, your doctor has ordered for you to drink the provided:  Ensure Pre-Surgery Clear Carbohydrate Drink  Drinking this carbohydrate drink up to two hours before surgery helps to reduce insulin resistance and improve patient outcomes. Please complete drinking 2 hours before scheduled arrival time.  One week prior to surgery: starting October 17 Stop Anti-inflammatories (NSAIDS) such as Advil, Aleve, Ibuprofen, Motrin, Naproxen, Naprosyn and Aspirin based products such as Excedrin, Goody's Powder, BC Powder. Top vitamin D, fish oil, multiple vitamins. Stop ANY OVER THE COUNTER supplements until after surgery.  You may however, continue to take Tylenol if needed for pain up until the day of surgery.  **Follow recommendations regarding stopping blood thinners. Stop aspirin for 5 days before surgery. Last day to take Aspirin is Friday, October 18. Resume  AFTER surgery per surgeon instruction.  Do not take sildenafil at least 3 days before surgery.  Continue taking all of your other prescription medications up until the day of surgery.  ON THE DAY OF SURGERY ONLY TAKE THESE MEDICATIONS WITH SIPS OF WATER:  Omeprazole (Prilosec)  No Alcohol for 24 hours before or after surgery.  No Smoking including e-cigarettes for 24 hours before surgery.  No chewable tobacco products for at least 6 hours before surgery.  No nicotine patches on the day of surgery.  Do not use any "recreational" drugs for at least a week (preferably 2 weeks) before your surgery.  Please be advised that the combination of cocaine and anesthesia may have negative outcomes, up to and including death. If you test positive for cocaine, your surgery will be cancelled.  On the morning of surgery brush your teeth with toothpaste and water, you may rinse your mouth with mouthwash if you wish. Do not swallow any toothpaste or mouthwash.  Use CHG Soap as directed on instruction sheet.  Do not wear jewelry, make-up, hairpins, clips or nail polish.  For welded (permanent) jewelry: bracelets, anklets, waist bands, etc.  Please have this removed prior to surgery.  If it is not removed, there is a chance that hospital personnel will need to cut it off on the day of surgery.  Do not wear lotions, powders, or perfumes.   Do not shave body hair from the neck down 48 hours before surgery.  Contact lenses, hearing aids and dentures may not be worn into surgery.  Do not bring  valuables to the hospital. [facility] is not responsible for any missing/lost belongings or valuables.   Bring your C-PAP to the hospital in case you may have to spend the night.   Notify your doctor if there is any change in your medical condition (cold, fever, infection).  Wear comfortable clothing (specific to your surgery type) to the hospital.  After surgery, you can help prevent lung complications by  doing breathing exercises.  Take deep breaths and cough every 1-2 hours. Your doctor may order a device called an Incentive Spirometer to help you take deep breaths.  If you are being admitted to the hospital overnight, leave your suitcase in the car. After surgery it may be brought to your room.  In case of increased patient census, it may be necessary for you, the patient, to continue your postoperative care in the Same Day Surgery department.  If you are being discharged the day of surgery, you will not be allowed to drive home. You will need a responsible individual to drive you home and stay with you for 24 hours after surgery.   If you are taking public transportation, you will need to have a responsible individual with you.  Please call the Pre-admissions Testing Dept. at [phone] if you have any questions about these instructions.  Surgery Visitation Policy:  Patients having surgery or a procedure may have two visitors.  Children under the age of 52 must have an adult with them who is not the patient.  Inpatient Visitation:    Visiting hours are 7 a.m. to 8 p.m. Up to four visitors are allowed at one time in a patient room. The visitors may rotate out with other people during the day.  One visitor age 55 or older may stay with the patient overnight and must be in the room by 8 p.m.       Pre-operative 5 CHG Bath Instructions   You can play a key role in reducing the risk of infection after surgery. Your skin needs to be as free of germs as possible. You can reduce the number of germs on your skin by washing with CHG (chlorhexidine gluconate) soap before surgery. CHG is an antiseptic soap that kills germs and continues to kill germs even after washing.   DO NOT use if you have an allergy to chlorhexidine/CHG or antibacterial soaps. If your skin becomes reddened or irritated, stop using the CHG and notify one of our RNs at [phone].   Please shower with the CHG  soap starting 4 days before surgery using the following schedule:     Please keep in mind the following:  DO NOT shave, including legs and underarms, starting the day of your first shower.   You may shave your face at any point before/day of surgery.  Place clean sheets on your bed the day you start using CHG soap. Use a clean washcloth (not used since being washed) for each shower. DO NOT sleep with pets once you start using the CHG.   CHG Shower Instructions:  If you choose to wash your hair and private area, wash first with your normal shampoo/soap.  After you use shampoo/soap, rinse your hair and body thoroughly to remove shampoo/soap residue.  Turn the water OFF and apply about 3 tablespoons (45 ml) of CHG soap to a CLEAN washcloth.  Apply CHG soap ONLY FROM YOUR NECK DOWN TO YOUR TOES (washing for 3-5 minutes)  DO NOT use CHG soap on face, private areas, open  wounds, or sores.  Pay special attention to the area where your surgery is being performed.  If you are having back surgery, having someone wash your back for you may be helpful. Wait 2 minutes after CHG soap is applied, then you may rinse off the CHG soap.  Pat dry with a clean towel  Put on clean clothes/pajamas   If you choose to wear lotion, please use ONLY the CHG-compatible lotions on the back of this paper.     Additional instructions for the day of surgery: DO NOT APPLY any lotions, deodorants, cologne, or perfumes.   Put on clean/comfortable clothes.  Brush your teeth.  Ask your nurse before applying any prescription medications to the skin.       CHG Compatible Lotions   Aveeno Moisturizing lotion  Cetaphil Moisturizing Cream  Cetaphil Moisturizing Lotion  Clairol Herbal Essence Moisturizing Lotion, Dry Skin  Clairol Herbal Essence Moisturizing Lotion, Extra Dry Skin  Clairol Herbal Essence Moisturizing Lotion, Normal Skin  Curel Age Defying Therapeutic Moisturizing Lotion with Alpha Hydroxy  Curel  Extreme Care Body Lotion  Curel Soothing Hands Moisturizing Hand Lotion  Curel Therapeutic Moisturizing Cream, Fragrance-Free  Curel Therapeutic Moisturizing Lotion, Fragrance-Free  Curel Therapeutic Moisturizing Lotion, Original Formula  Eucerin Daily Replenishing Lotion  Eucerin Dry Skin Therapy Plus Alpha Hydroxy Crme  Eucerin Dry Skin Therapy Plus Alpha Hydroxy Lotion  Eucerin Original Crme  Eucerin Original Lotion  Eucerin Plus Crme Eucerin Plus Lotion  Eucerin TriLipid Replenishing Lotion  Keri Anti-Bacterial Hand Lotion  Keri Deep Conditioning Original Lotion Dry Skin Formula Softly Scented  Keri Deep Conditioning Original Lotion, Fragrance Free Sensitive Skin Formula  Keri Lotion Fast Absorbing Fragrance Free Sensitive Skin Formula  Keri Lotion Fast Absorbing Softly Scented Dry Skin Formula  Keri Original Lotion  Keri Skin Renewal Lotion Keri Silky Smooth Lotion  Keri Silky Smooth Sensitive Skin Lotion  Nivea Body Creamy Conditioning Oil  Nivea Body Extra Enriched Lotion  Nivea Body Original Lotion  Nivea Body Sheer Moisturizing Lotion Nivea Crme  Nivea Skin Firming Lotion  NutraDerm 30 Skin Lotion  NutraDerm Skin Lotion  NutraDerm Therapeutic Skin Cream  NutraDerm Therapeutic Skin Lotion  ProShield Protective Hand Cream  Provon moisturizing lotion        Preoperative Educational Videos for Total Hip, Knee and Shoulder Replacements  To better prepare for surgery, please view our videos that explain the physical activity and discharge planning required to have the best surgical recovery at [facility].  [email or web address]  Questions? Call [phone] or email [email or web address]

## 2023-07-14 ENCOUNTER — Encounter: Payer: Self-pay | Admitting: Orthopedic Surgery

## 2023-07-14 IMAGING — MR MR LUMBAR SPINE W/O CM
5 series · 31 of 48 positions shown · non-contrast
Comparison: None.

CLINICAL DATA: Low back pain radiating to the right thigh. No known
injury.

EXAM:
MRI LUMBAR SPINE WITHOUT CONTRAST
TECHNIQUE: Multiplanar, multisequence MR imaging of the lumbar spine was
performed. No intravenous contrast was administered.

[Series 5: T2 · sagittal · 4.0mm · 0.81mm/px · 6 of 17 slices shown (1 of 2)]
[im 1/17]
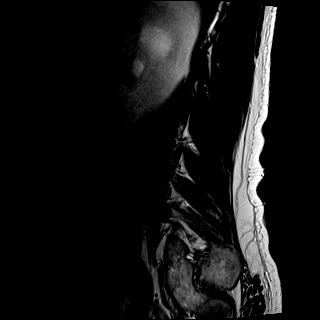
[im 4/17]
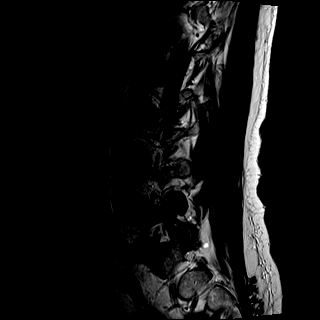
[im 7/17]
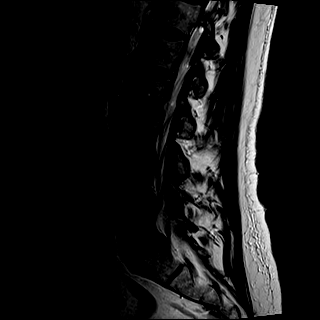
[im 10/17]
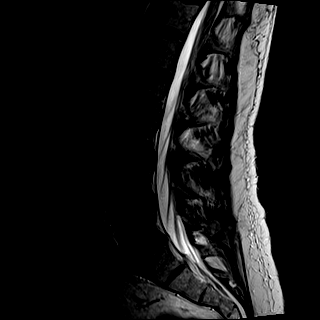
[im 13/17]
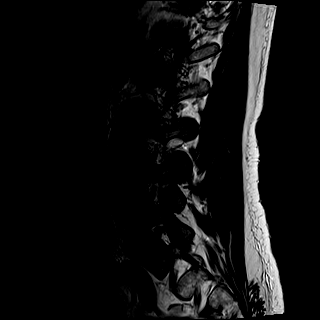
[im 17/17]
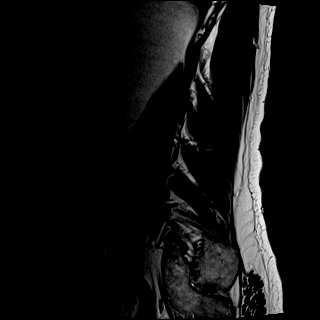

[Series 6: T1 · sagittal · 4.0mm · 0.81mm/px · 7 of 17 slices shown (1 of 2)]
[im 1/17]
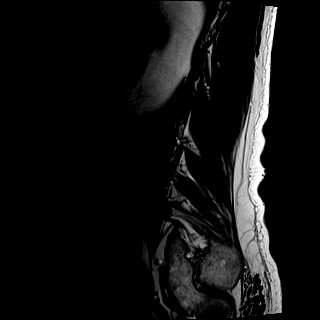
[im 3/17]
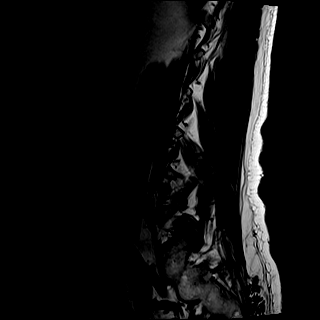
[im 6/17]
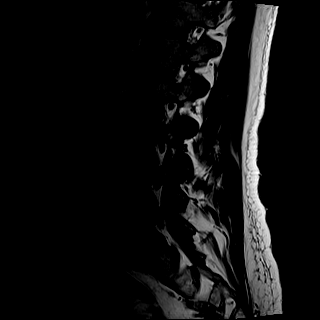
[im 9/17]
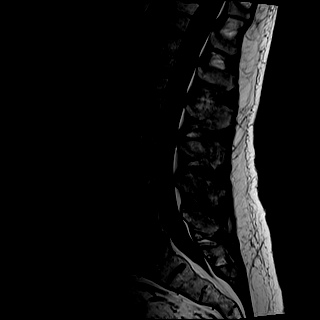
[im 11/17]
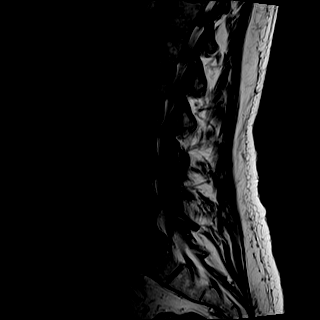
[im 14/17]
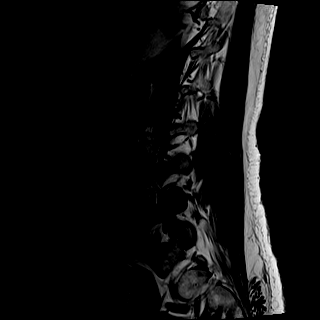
[im 17/17]
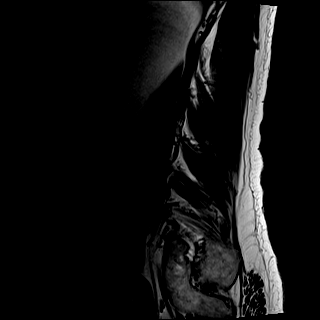

[Series 7: STIR · sagittal · 4.0mm · 0.41mm/px · 2 of 17 slices shown]
[im 1/17]
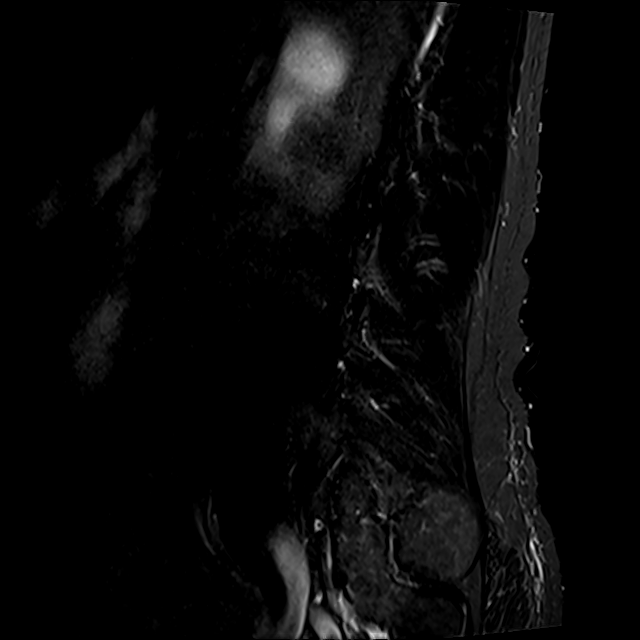
[im 3/17]
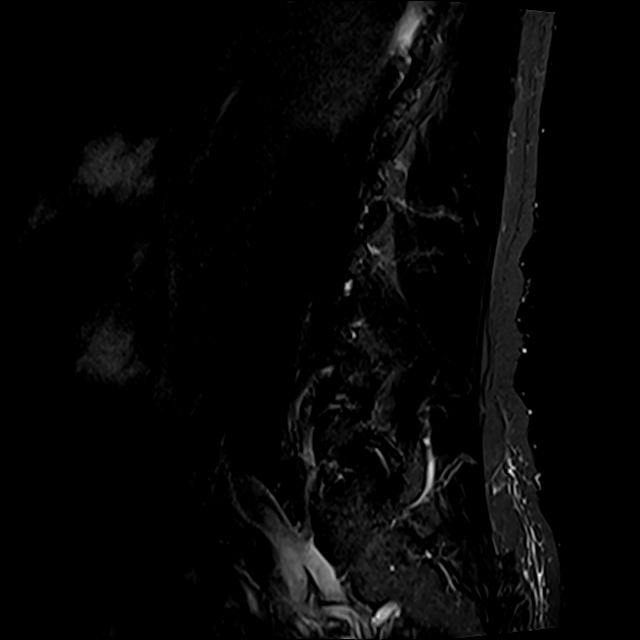

[Series 8: T2 · axial · 4.0mm · 0.78mm/px · z∈[-101,+117]mm · 8 of 37 slices shown (2 of 2)]
[im 1/37]
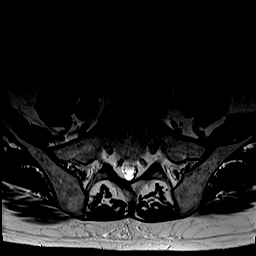
[im 6/37]
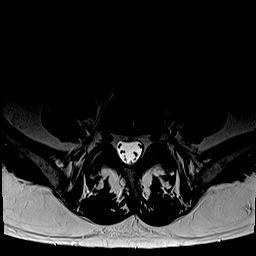
[im 12/37]
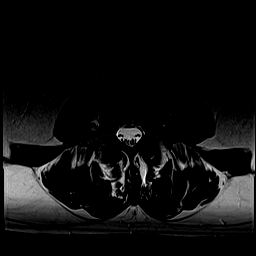
[im 17/37]
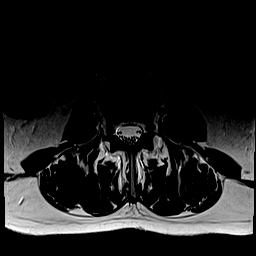
[im 20/37]
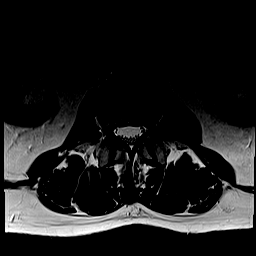
[im 25/37]
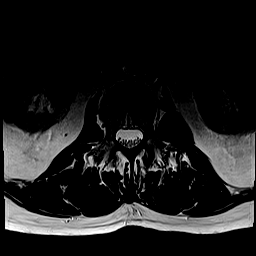
[im 31/37]
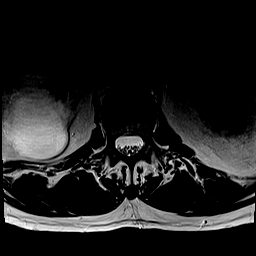
[im 37/37]
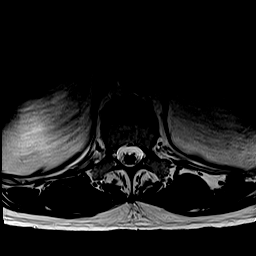

[Series 9: T1 · axial · 4.0mm · 0.39mm/px · z∈[-101,+117]mm · 8 of 37 slices shown (2 of 2)]
[im 1/37]
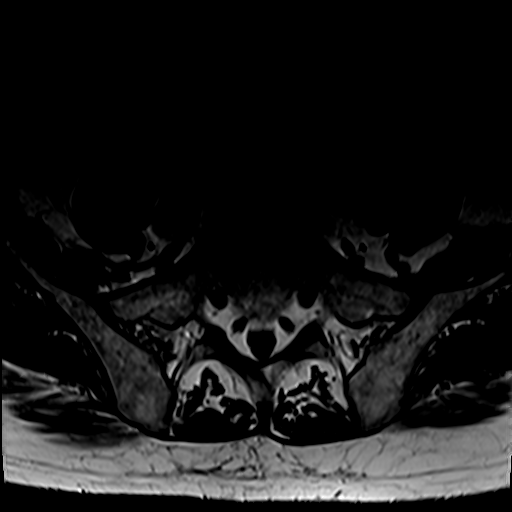
[im 6/37]
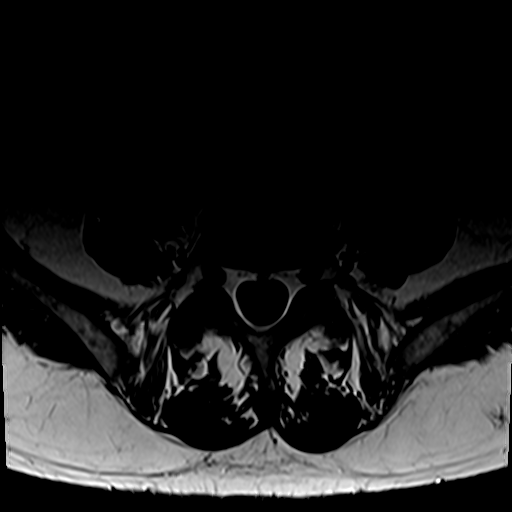
[im 12/37]
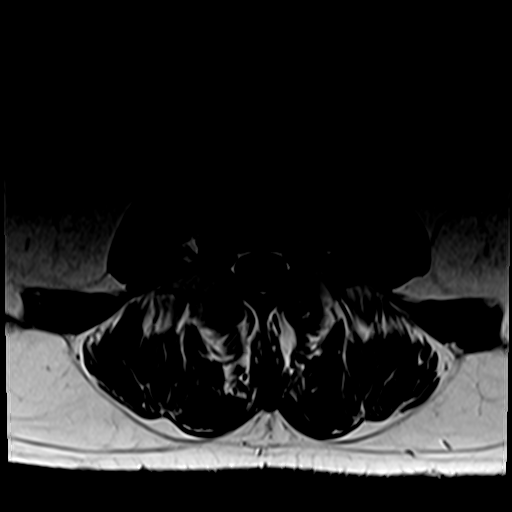
[im 17/37]
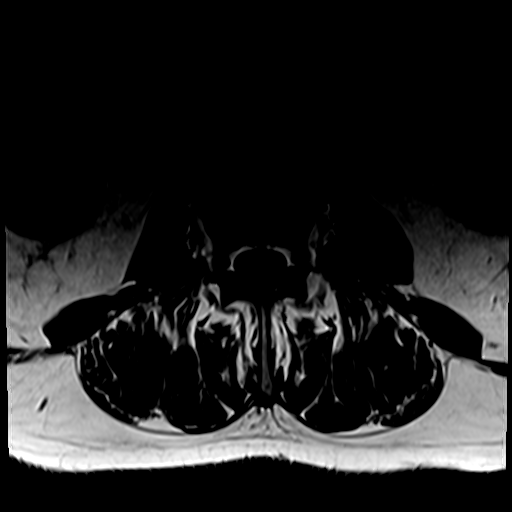
[im 20/37]
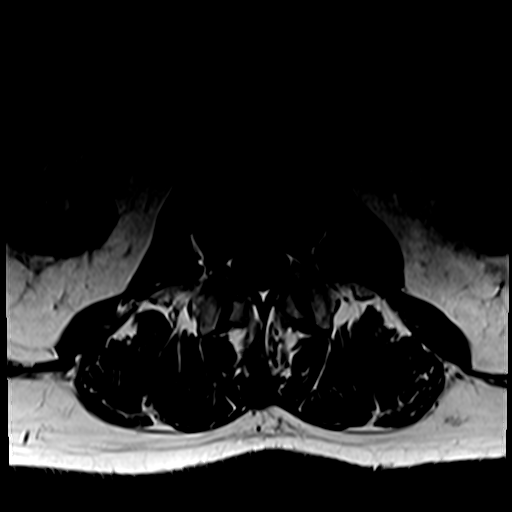
[im 25/37]
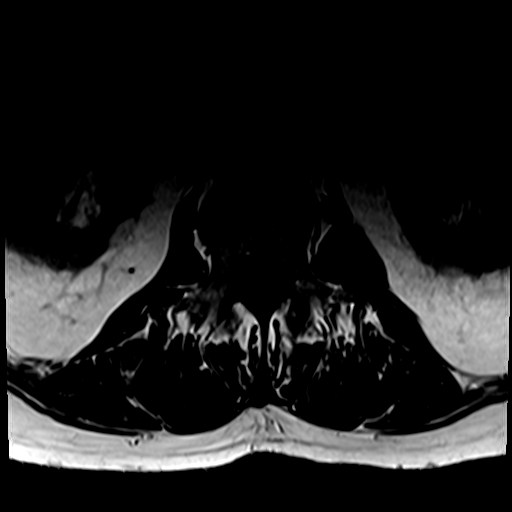
[im 31/37]
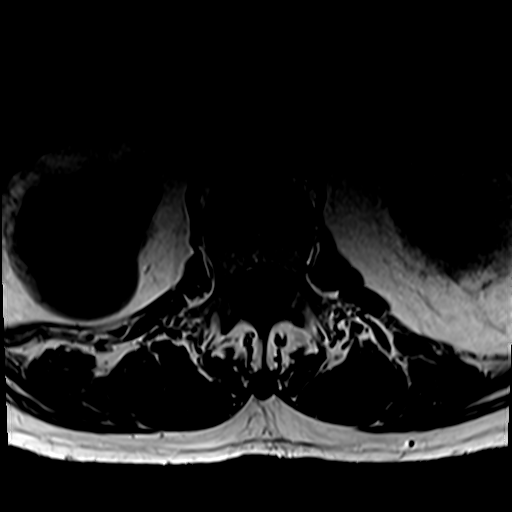
[im 37/37]
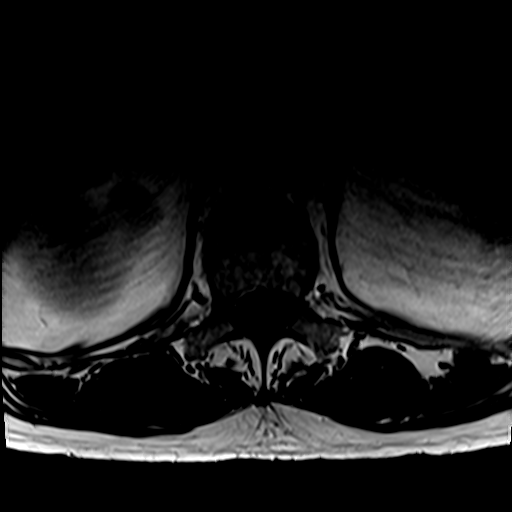

[31 of 48 positions shown; findings below may reference images not displayed]

FINDINGS: Segmentation:  Standard.

Alignment:  Physiologic.

Vertebrae: No acute fracture, evidence of discitis, or aggressive
bone lesion.

Conus medullaris and cauda equina: Conus extends to the L1 level.
Conus and cauda equina appear normal.

Paraspinal and other soft tissues: No acute paraspinal abnormality.
Partially visualized large right renal cyst.

Disc levels:

Disc spaces: Disc desiccation of the lumbar spine without
significant disc height loss.

T12-L1: No significant disc bulge. No neural foraminal stenosis. No
central canal stenosis.

L1-L2: No significant disc bulge. No neural foraminal stenosis. No
central canal stenosis.

L2-L3: No significant disc bulge. No neural foraminal stenosis. No
central canal stenosis.

L3-L4: Minimal broad-based disc bulge. No foraminal or central canal
stenosis.

L4-L5: Mild broad-based disc bulge. No foraminal or central canal
stenosis.

L5-S1: Mild broad-based disc bulge. Mild bilateral facet
arthropathy. 4 mm right facet and 4 mm left facet synovial cyst
projecting into the neural foramen without foraminal stenosis. No
foraminal or central canal stenosis.
IMPRESSION: 1. No significant lumbar spine disc protrusion, foraminal stenosis
or central canal stenosis.
2. No acute osseous injury of the lumbar spine.
3. At L5-S1 there is a mild broad-based disc bulge. Mild bilateral
facet arthropathy. 4 mm right facet and 4 mm left facet synovial
cyst projecting into the neural foramen without foraminal stenosis.
No central canal stenosis.

## 2023-07-14 MED ORDER — PROPOFOL 1000 MG/100ML IV EMUL
INTRAVENOUS | Status: AC
  Filled 2023-07-14: qty 200

## 2023-07-14 NOTE — Progress Notes (Signed)
Perioperative / Anesthesia Services  Pre-Admission Testing Clinical Review / Pre-Operative Anesthesia Consult  Date: 07/16/23  Patient Demographics:  Name: Andre Stone DOB:   02/27/1946 MRN:   [id number]  Planned Surgical Procedure(s):    Case: [id number] Date/Time: 07/17/23 1009   Procedure: TOTAL HIP ARTHROPLASTY ANTERIOR APPROACH (Right: Hip)   Anesthesia type: Choice   Pre-op diagnosis: Right hip pain M25.551   Location: [facility] OR ROOM 01 / [facility]   Surgeons: Reinaldo Berber, MD     NOTE: Available PAT nursing documentation and vital signs have been reviewed. Clinical nursing staff has updated patient's PMH/PSHx, current medication list, and drug allergies/intolerances to ensure comprehensive history available to assist in medical decision making as it pertains to the aforementioned surgical procedure and anticipated anesthetic course. Extensive review of available clinical information personally performed. [facility] and P[facility] updated with any diagnoses/procedures that  may have been inadvertently omitted during his intake with the pre-admission testing department's nursing staff.  Clinical Discussion:  Andre Stone is a 77 y.o. male who is submitted for pre-surgical anesthesia review and clearance prior to him undergoing the above procedure. Patient has never been a smoker. Pertinent PMH includes: CAD, ? 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  Patient is followed by cardiology Antoine Poche, MD). He was last seen in the cardiology clinic on 02/11/2023; notes reviewed. At the time of his clinic visit, patient doing well overall from a cardiovascular perspective. Patient denied any chest pain, shortness of breath, PND, orthopnea, palpitations, significant peripheral edema, weakness, fatigue, vertiginous  symptoms, or presyncope/syncope. Patient with a past medical history significant for cardiovascular diagnoses. Documented physical exam was grossly benign, providing no evidence of acute exacerbation and/or decompensation of the patient's known cardiovascular conditions.  Myocardial perfusion imaging study was performed on 10/02/2017 revealing a normal left ventricular systolic function with an EF of 62%.  There were no regional wall motion abnormalities.  There was no evidence of stress-induced myocardial ischemia or arrhythmia; no scintigraphic evidence of scar.  Study determined to be normal and low risk.  Additionally, imaging study also made an incidental finding of an ectatic thoracic aorta measuring up to 3.9 cm.  Coronary CTA was performed on 08/29/2022 that demonstrated an Agatston coronary artery calcium score of  937. This placed patient in the 77th percentile for age, sex, and race matched controls. Calcium depositions noted to be isolated mainly in the proximal LAD (50-69%), proximal RCA (25-49%), and distal LM (<25% distributions.  Study demonstrates normal coronary origin with RIGHT dominance.  FFR analysis was sent for further evaluation (ranges: < 0.75 high likelihood of hemodynamically significant stenosis, 0.76-0.80 borderline, > 0.80 normal):  Left main: No significant stenosis LAD = 0.85 LCx = 0.97 RCA = 0.94  Blood pressure well controlled at 118/64 mmHg on currently prescribed diuretic (HCTZ) and ACEi (lisinopril) therapies.  Patient is on atorvastatin for his HLD diagnosis and ASCVD prevention.  Patient has a prediabetes diagnosis.  At the time of his visit with cardiology, last hemoglobin A1c was 6.2% when checked on 11/13/2022.  Since that time, patient's A1c has been reassessed with further improvement to 5.9% when checked on 05/14/2023.  Patient does have an OSAH diagnosis and is compliant with prescribed nocturnal PAP therapy.  It is important to note that in the setting of  known cardiovascular diagnoses, patient is on a PDE5i (sildenafil) for  and erectile dysfunction diagnosis.  Patient is active per baseline.  He does have a formal exercise regimen and reports that he goes to the fitness center several days a week to use the elliptical and do "other exercises".  With that said, he is able to complete all of his ADLs/IADLs independently without cardiovascular limitation.  Per the DASI, patient is able to achieve >4 METS of physical activity without experiencing any significant degrees of angina/anginal equivalent symptoms.  No changes were made to his medication regimen.  Patient to follow-up with outpatient cardiology in 1 year or sooner if needed.  Andre Stone is scheduled for an elective TOTAL HIP ARTHROPLASTY ANTERIOR APPROACH (Right: Hip) on 07/17/2023 with Dr. Reinaldo Berber, MD.  Given patient's past medical history significant for cardiovascular diagnoses, presurgical cardiac clearance was sought by the PAT team.  Per cardiology, "according to the RCRI, patient has a 0.4% risk of MACE. Patient reports activity equivalent to >4.0 METS (goes to the fitness center a few days a week to use the elliptical and do other exercises). Given past medical history and time since last visit, based on ACC/AHA guidelines, Andre Stone would be at ACCEPTABLE risk for the planned procedure without further cardiovascular testing".    In review of his medication reconciliation, it is noted that patient is currently on prescribed daily antithrombotic therapy. He has been instructed on recommendations for holding his daily low-dose ASA for 5 days prior to his procedure with plans to restart as soon as postoperative bleeding risk felt to be minimized by his attending surgeon. The patient has been instructed that his last dose of his ASA should be on 07/11/2023.  Patient denies previous perioperative complications with anesthesia in the past. In review of the available records, it  is noted that patient underwent a general anesthetic course ACCEPTABLE (ASA III) in 12/2021 without documented complications.      07/08/2023   10:47 AM 04/02/2023    1:02 PM 02/13/2023    1:52 PM  Vitals with BMI  Height 6\' 0"  6\' 0"  6\' 0"   Weight 202 lbs 195 lbs 195 lbs 6 oz  BMI 27.39 26.44 26.49  Systolic 110 157 629  Diastolic 77 85 69  Pulse 68 72 80    Providers/Specialists:   NOTE: Primary physician provider listed below. Patient may have been seen by APP or partner within same practice.   PROVIDER ROLE / SPECIALTY LAST Wilber Bihari, MD Orthopedics (Surgeon) 07/08/2023  Marisue Ivan, MD Primary Care Provider 05/21/2023  Rollene Rotunda, MD Cardiology 02/11/2023; update preop APP call on 06/13/2023   Allergies:  Glucosamine-chondroitin and Penicillins  Current Home Medications:   No current facility-administered medications for this encounter.    aspirin EC 81 MG tablet   atorvastatin (LIPITOR) 80 MG tablet   Cholecalciferol 25 MCG (1000 UT) capsule   hydrochlorothiazide (HYDRODIURIL) 25 MG tablet   lisinopril (PRINIVIL,ZESTRIL) 40 MG tablet   Multiple Vitamins-Minerals (MULTIVITAMIN WITH MINERALS) tablet   Omega-3 Fatty Acids (FISH OIL) 1000 MG CAPS   omeprazole (PRILOSEC) 20 MG capsule   sildenafil (REVATIO) 20 MG tablet   traZODone (DESYREL) 150 MG tablet   History:   Past Medical History:  Diagnosis Date   Anemia    Aortic atherosclerosis (HCC)    Borderline diabetes mellitus 01/27/2014   Cataract    Community acquired pneumonia 12/25/2015   Coronary artery disease involving native coronary artery of native heart with angina pectoris (HCC)    a.) cCTA 08/29/2022:  Ca2+ = 937 (77th %'ile for age/sex/race match control); 50-60% pLAD, 25-49% pRCA, < 25% dLM and LCx --> FFRct : LAD (0.85), LCx (0.97), RCA (0.94)   Diverticulitis large intestine    Ectatic thoracic aorta (HCC) 08/29/2022   a.) cCTA 08/29/2022: asc Ao = 3.9 cm   Erectile  dysfunction    a.) on PDE5i (sildenafil) PRN   Essential (primary) hypertension 12/28/2015   Gastro-esophageal reflux disease without esophagitis 12/28/2015   History of kidney stones    Hypertension    Insomnia    a.) takes trazodone PRN   Long term current use of aspirin    Mixed hyperlipidemia    Myocardial infarction [facility])    possible small event EKG changes   OSA on CPAP    Osteoarthritis    RBBB (right bundle branch block)    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[facility])    Past Surgical History:  Procedure Laterality Date   CHOLECYSTECTOMY     COLONOSCOPY     2003, 2005, 2008   COLONOSCOPY  10/23/2012   COLONOSCOPY  01/01/2018   CYSTOSCOPY W/ RETROGRADES Right 11/02/2019   Procedure: CYSTOSCOPY WITH RETROGRADE PYELOGRAM;  Surgeon: Riki Altes, MD;  Location: [facility] ORS;  Service: Urology;  Laterality: Right;   CYSTOSCOPY W/ RETROGRADES N/A 01/08/2022   Procedure: CYSTOSCOPY WITH RETROGRADE PYELOGRAM/ BLADDER BIOPSY;  Surgeon: Riki Altes, MD;  Location: [facility] ORS;  Service: Urology;  Laterality: N/A;   CYSTOSCOPY WITH BIOPSY N/A 11/02/2019   Procedure: CYSTOSCOPY WITH BIOPSY;  Surgeon: Riki Altes, MD;  Location: [facility] ORS;  Service: Urology;  Laterality: N/A;   CYSTOSCOPY/RETROGRADE/URETEROSCOPY/STONE EXTRACTION WITH BASKET Right 01/08/2022   Procedure: CYSTOSCOPY/RETROGRADE/URETEROSCOPY/STONE EXTRACTION WITH BASKET;  Surgeon: Riki Altes, MD;  Location: [facility] ORS;  Service: Urology;  Laterality: Right;   CYSTOSCOPY/URETEROSCOPY/HOLMIUM LASER/STENT PLACEMENT Right 12/29/2015   Procedure: CYSTOSCOPY/URETEROSCOPY/ with basketing of stone, right ureteral /STENT PLACEMENT;  Surgeon: Sebastian Ache, MD;  Location: [facility] ORS;  Service: Urology;  Laterality: Right;   CYSTOSCOPY/URETEROSCOPY/HOLMIUM LASER/STENT PLACEMENT Right 11/02/2019   Procedure: CYSTOSCOPY/URETEROSCOPY/HOLMIUM LASER/STENT PLACEMENT;  Surgeon: Riki Altes, MD;  Location: [facility] ORS;  Service: Urology;   Laterality: Right;   CYSTOSCOPY/URETEROSCOPY/HOLMIUM LASER/STENT PLACEMENT Right 01/08/2022   Procedure: CYSTOSCOPY/URETEROSCOPY/HOLMIUM LASER/STENT PLACEMENT;  Surgeon: Riki Altes, MD;  Location: [facility] ORS;  Service: Urology;  Laterality: Right;   ERCP  06/15/2003   ESOPHAGOGASTRODUODENOSCOPY     2003, 2004   TONSILLECTOMY  1957   UPPER ESOPHAGEAL ENDOSCOPIC ULTRASOUND (EUS)  07/06/2003   Family History  Problem Relation Age of Onset   Stroke Mother    AAA (abdominal aortic aneurysm) Father    Hypertension Father    Bladder Cancer Neg Hx    Kidney cancer Neg Hx    Prostate cancer Neg Hx    Social History   Tobacco Use   Smoking status: Never    Passive exposure: Never   Smokeless tobacco: Never  Vaping Use   Vaping status: Never Used  Substance Use Topics   Alcohol use: Yes    Alcohol/week: 1.0 standard drink of alcohol    Types: 1 Shots of liquor per week    Comment: regular, several times per week, liquor   Drug use: No    Pertinent Clinical Results:  LABS:   No visits with results within 3 Day(s) from this visit.  Latest known visit with results is:  Hospital Outpatient Visit on 07/08/2023  Component Date Value Ref Range Status   WBC 07/08/2023 6.1  4.0 -  10.5 K/uL Final   RBC 07/08/2023 4.96  4.22 - 5.81 MIL/uL Final   Hemoglobin 07/08/2023 14.9  13.0 - 17.0 g/dL Final   HCT 16/09/930 43.0  39.0 - 52.0 % Final   MCV 07/08/2023 86.7  80.0 - 100.0 fL Final   MCH 07/08/2023 30.0  26.0 - 34.0 pg Final   MCHC 07/08/2023 34.7  30.0 - 36.0 g/dL Final   RDW 35/57/3220 13.2  11.5 - 15.5 % Final   Platelets 07/08/2023 174  150 - 400 K/uL Final   nRBC 07/08/2023 0.0  0.0 - 0.2 % Final   Neutrophils Relative % 07/08/2023 58  % Final   Neutro Abs 07/08/2023 3.6  1.7 - 7.7 K/uL Final   Lymphocytes Relative 07/08/2023 30  % Final   Lymphs Abs 07/08/2023 1.8  0.7 - 4.0 K/uL Final   Monocytes Relative 07/08/2023 10  % Final   Monocytes Absolute 07/08/2023 0.6  0.1 -  1.0 K/uL Final   Eosinophils Relative 07/08/2023 1  % Final   Eosinophils Absolute 07/08/2023 0.1  0.0 - 0.5 K/uL Final   Basophils Relative 07/08/2023 1  % Final   Basophils Absolute 07/08/2023 0.0  0.0 - 0.1 K/uL Final   Immature Granulocytes 07/08/2023 0  % Final   Abs Immature Granulocytes 07/08/2023 0.02  0.00 - 0.07 K/uL Final   Performed at [facility], [address] [place], [place] [address]   Sodium 07/08/2023 137  135 - 145 mmol/L Final   Potassium 07/08/2023 4.0  3.5 - 5.1 mmol/L Final   Chloride 07/08/2023 99  98 - 111 mmol/L Final   CO2 07/08/2023 25  22 - 32 mmol/L Final   Glucose, Bld 07/08/2023 110 (H)  70 - 99 mg/dL Final   Glucose reference range applies only to samples taken after fasting for at least 8 hours.   BUN 07/08/2023 23  8 - 23 mg/dL Final   Creatinine, Ser 07/08/2023 1.29 (H)  0.61 - 1.24 mg/dL Final   Calcium 03/16/7627 9.6  8.9 - 10.3 mg/dL Final   Total Protein 31/51/7616 7.9  6.5 - 8.1 g/dL Final   Albumin 07/37/1062 4.6  3.5 - 5.0 g/dL Final   AST 69/48/5462 36  15 - 41 U/L Final   ALT 07/08/2023 39  0 - 44 U/L Final   Alkaline Phosphatase 07/08/2023 62  38 - 126 U/L Final   Total Bilirubin 07/08/2023 0.9  0.3 - 1.2 mg/dL Final   GFR, Estimated 07/08/2023 57 (L)  >60 mL/min Final   Comment: (NOTE) Calculated using the CKD-EPI Creatinine Equation (2021)    Anion gap 07/08/2023 13  5 - 15 Final   Performed at [facility], [address] [place], [place] [address]   Color, Urine 07/08/2023 YELLOW (A)  YELLOW Final   APPearance 07/08/2023 CLEAR (A)  CLEAR Final   Specific Gravity, Urine 07/08/2023 1.013  1.005 - 1.030 Final   pH 07/08/2023 6.0  5.0 - 8.0 Final   Glucose, UA 07/08/2023 NEGATIVE  NEGATIVE mg/dL Final   Hgb urine dipstick 07/08/2023 NEGATIVE  NEGATIVE Final   Bilirubin Urine 07/08/2023 NEGATIVE  NEGATIVE Final   Ketones, ur 07/08/2023 NEGATIVE  NEGATIVE mg/dL Final   Protein, ur 09/38/1829 NEGATIVE  NEGATIVE  mg/dL Final   Nitrite 93/71/6967 NEGATIVE  NEGATIVE Final   Leukocytes,Ua 07/08/2023 NEGATIVE  NEGATIVE Final   Performed at [facility], [address] [place] [place], [place] [address]   ABO/RH(D) 07/08/2023 B POS  Final   Antibody Screen 07/08/2023 NEG   Final   Sample Expiration 07/08/2023 07/22/2023,2359   Final   Extend sample reason 07/08/2023    Final                   Value:NO TRANSFUSIONS OR PREGNANCY IN THE PAST 3 MONTHS Performed at [facility], [address] [place], [place] [address]    MRSA, PCR 07/08/2023 NEGATIVE  NEGATIVE Final   Staphylococcus aureus 07/08/2023 NEGATIVE  NEGATIVE Final   Comment: (NOTE) The Xpert SA Assay (FDA approved for NASAL specimens in patients 67 years of age and older), is one component of a comprehensive surveillance program. It is not intended to diagnose infection nor to guide or monitor treatment. Performed at [facility], [address] [place], [place] [address]     ECG: Date: 07/08/2023 Time ECG obtained: 1100 AM Rate: 70 bpm Rhythm:  Rhythm; RBBB Axis (leads I and aVF): Normal Intervals: PR 158 ms. QRS 142 ms. QTc 473 ms. ST segment and T wave changes: No evidence of acute ST segment elevation or depression.   Comparison: Similar to previous tracing obtained on 01/07/2022   IMAGING / PROCEDURES: CT RENAL STONE STUDY performed on 05/14/2023 Nonobstructing right renal calculi measuring up to 3 mm. No ureteral or bladder calculi. No hydronephrosis. Mild focal outpouching along the right anterior infrarenal abdominal aorta, as above, without aneurysmal dilatation. Mild sigmoid diverticulosis  Atherosclerotic calcifications of the abdominal aorta and branch vessels.   DIAGNOSTIC RADIOGRAPHS OF RIGHT HIP 2-3 VIEWS performed on 04/25/2023 Moderate/severe degenerative changes of the right hip with joint space narrowing and osteophyte formation, subchondral cysts and sclerosis with femoral head  deformity with flattening of the superior lateral aspect of the head and a cam lesion as well as an inferior osteophyte.  The left hip is noted to have mild degenerative changes with some medial and superior joint space narrowing and mild acetabular sclerosis otherwise bony morphology of the femoral head is normal.   No fractures or dislocations noted in either hip.   CT CORONARY MORPH W/CTA COR W/SCORE W/CA W/CM &/OR WO/CM W/ FRACTIONAL FLOW RESERVE performed on 08/29/2022 The ascending aorta measures up to 3.9 cm in caliber, ectatic but not aneurysmal.  Coronary calcium score previously calculated 937. Normal coronary origin with right dominance. Moderate proximal LAD stenosis (50-69%). Mild proxima RCA stenosis (25-49%). Minimal stenosis in the distal LM, LCx (<25%). CAD-RADS 3. Moderate stenosis. Consider symptom-guided anti-ischemic pharmacotherapy as well as risk factor modification per guideline directed care. CT FFR analysis (ranges: < 0.75 high likelihood of hemodynamically significant stenosis, 0.76-0.80 borderline, > 0.80 normal): Left Main:  No significant stenosis. LAD: No significant stenosis.  FFRct 0.8 LCX: No significant stenosis.  FFRct 0.97 RCA: No significant stenosis.  FFRct 0.94  Impression and Plan:  Andre Stone has been referred for pre-anesthesia review and clearance prior to him undergoing the planned anesthetic and procedural courses. Available labs, pertinent testing, and imaging results were personally reviewed by me in preparation for upcoming operative/procedural course. [facility] medical record has been updated following extensive record review and patient interview with PAT staff.   This patient has been appropriately cleared by cardiology with an overall LOW risk of experiencing significant perioperative cardiovascular complications. Based on clinical review performed today (07/16/23), barring any significant acute changes in the patient's overall  condition, it is anticipated that he will be able to proceed with the planned surgical intervention. Any acute changes in clinical  condition may necessitate his procedure being postponed and/or cancelled. Patient will meet with anesthesia team (MD and/or CRNA) on the day of his procedure for preoperative evaluation/assessment. Questions regarding anesthetic course will be fielded at that time.   Pre-surgical instructions were reviewed with the patient during his PAT appointment, and questions were fielded to satisfaction by PAT clinical staff. He has been instructed on which medications that he will need to hold prior to surgery, as well as the ones that have been deemed safe/appropriate to take on the day of his procedure. As part of the general education provided by PAT, patient made aware both verbally and in writing, that he would need to abstain from the use of any illegal substances during his perioperative course.  He was advised that failure to follow the provided instructions could necessitate case cancellation or result in serious perioperative complications up to and including death. Patient encouraged to contact PAT and/or his surgeon's office to discuss any questions or concerns that may arise prior to surgery; verbalized understanding.   Quentin Mulling, MSN, APRN, FNP-C, CEN [facility]  Perioperative Services Nurse Practitioner Phone: [phone] Fax: [phone] 07/16/23 8:50 AM  NOTE: This note has been prepared using Dragon dictation software. Despite my best ability to proofread, there is always the potential that unintentional transcriptional errors may still occur from this process.

## 2023-07-15 MED ORDER — ONDANSETRON HCL 4 MG/2ML IJ SOLN
INTRAMUSCULAR | Status: AC
  Filled 2023-07-15: qty 4

## 2023-07-17 ENCOUNTER — Ambulatory Visit: Payer: Medicare PPO

## 2023-07-17 ENCOUNTER — Ambulatory Visit: Payer: Medicare PPO | Admitting: Urgent Care

## 2023-07-17 ENCOUNTER — Other Ambulatory Visit: Payer: Self-pay

## 2023-07-17 ENCOUNTER — Encounter: Payer: Self-pay | Admitting: Orthopedic Surgery

## 2023-07-17 ENCOUNTER — Observation Stay
Admission: RE | Admit: 2023-07-17 | Discharge: 2023-07-18 | Disposition: A | Discharge status: Home Health | Payer: Medicare PPO | Source: Ambulatory Visit | Attending: Orthopedic Surgery | Admitting: Orthopedic Surgery

## 2023-07-17 ENCOUNTER — Encounter
Admission: RE | Disposition: A | Discharge status: Home Health | Payer: Self-pay | Source: Ambulatory Visit | Attending: Orthopedic Surgery

## 2023-07-17 DIAGNOSIS — Z7982 Long term (current) use of aspirin: Secondary | ICD-10-CM | POA: Diagnosis not present

## 2023-07-17 DIAGNOSIS — I10 Essential (primary) hypertension: Secondary | ICD-10-CM | POA: Insufficient documentation

## 2023-07-17 DIAGNOSIS — I2510 Atherosclerotic heart disease of native coronary artery without angina pectoris: Secondary | ICD-10-CM | POA: Diagnosis not present

## 2023-07-17 DIAGNOSIS — M1611 Unilateral primary osteoarthritis, right hip: Secondary | ICD-10-CM | POA: Diagnosis present

## 2023-07-17 DIAGNOSIS — Z79899 Other long term (current) drug therapy: Secondary | ICD-10-CM | POA: Insufficient documentation

## 2023-07-17 DIAGNOSIS — Z8551 Personal history of malignant neoplasm of bladder: Secondary | ICD-10-CM | POA: Diagnosis not present

## 2023-07-17 DIAGNOSIS — Z01812 Encounter for preprocedural laboratory examination: Principal | ICD-10-CM

## 2023-07-17 DIAGNOSIS — Z96641 Presence of right artificial hip joint: Secondary | ICD-10-CM | POA: Diagnosis present

## 2023-07-17 HISTORY — DX: Unspecified osteoarthritis, unspecified site: M19.90

## 2023-07-17 HISTORY — PX: TOTAL HIP ARTHROPLASTY: SHX124

## 2023-07-17 HISTORY — DX: Long term (current) use of aspirin: Z79.82

## 2023-07-17 HISTORY — DX: Unspecified right bundle-branch block: I45.10

## 2023-07-17 HISTORY — DX: Atherosclerosis of aorta: I70.0

## 2023-07-17 HISTORY — DX: Male erectile dysfunction, unspecified: N52.9

## 2023-07-17 HISTORY — DX: Insomnia, unspecified: G47.00

## 2023-07-17 LAB — ABO/RH: ABO/RH(D): B POS

## 2023-07-17 SURGERY — ARTHROPLASTY, HIP, TOTAL, ANTERIOR APPROACH
Anesthesia: General | Site: Hip | Laterality: Right

## 2023-07-17 MED ORDER — CHLORHEXIDINE GLUCONATE 0.12 % MT SOLN
15.0000 mL | Freq: Once | OROMUCOSAL | Status: AC
  Administered 2023-07-17: 15 mL via OROMUCOSAL

## 2023-07-17 MED ORDER — ACETAMINOPHEN 500 MG PO TABS: 1000.0000 mg | ORAL_TABLET | Freq: Three times a day (TID) | ORAL | Status: DC

## 2023-07-17 MED ORDER — MIDAZOLAM HCL 5 MG/5ML IJ SOLN
INTRAMUSCULAR | Status: DC | PRN
  Administered 2023-07-17: 2 mg via INTRAVENOUS

## 2023-07-17 MED ORDER — SODIUM CHLORIDE (PF) 0.9 % IJ SOLN
INTRAMUSCULAR | Status: DC | PRN
  Administered 2023-07-17: 50 mL via INTRAMUSCULAR

## 2023-07-17 MED ORDER — MENTHOL 3 MG MT LOZG: 1.0000 | LOZENGE | OROMUCOSAL | Status: DC | PRN

## 2023-07-17 MED ORDER — METOCLOPRAMIDE HCL 10 MG PO TABS: 5.0000 mg | ORAL_TABLET | Freq: Three times a day (TID) | ORAL | Status: DC | PRN

## 2023-07-17 MED ORDER — TRAZODONE HCL 50 MG PO TABS: 75.0000 mg | ORAL_TABLET | Freq: Every evening | ORAL | Status: DC | PRN

## 2023-07-17 MED ORDER — METOCLOPRAMIDE HCL 5 MG/ML IJ SOLN: 5.0000 mg | Freq: Three times a day (TID) | INTRAMUSCULAR | Status: DC | PRN

## 2023-07-17 MED ORDER — LISINOPRIL 20 MG PO TABS
40.0000 mg | ORAL_TABLET | Freq: Every day | ORAL | Status: DC
  Administered 2023-07-18: 40 mg via ORAL

## 2023-07-17 MED ORDER — PROPOFOL 10 MG/ML IV BOLUS
INTRAVENOUS | Status: DC | PRN
  Administered 2023-07-17: 50 ug/kg/min via INTRAVENOUS

## 2023-07-17 MED ORDER — ACETAMINOPHEN 10 MG/ML IV SOLN
INTRAVENOUS | Status: DC | PRN
  Administered 2023-07-17: 1000 mg via INTRAVENOUS

## 2023-07-17 MED ORDER — HYDROCODONE-ACETAMINOPHEN 5-325 MG PO TABS
1.0000 | ORAL_TABLET | ORAL | Status: DC | PRN
  Administered 2023-07-17 – 2023-07-18 (×4): 2 via ORAL

## 2023-07-17 MED ORDER — MIDAZOLAM HCL 2 MG/2ML IJ SOLN
INTRAMUSCULAR | Status: AC
  Filled 2023-07-17: qty 2

## 2023-07-17 MED ORDER — HYDROCODONE-ACETAMINOPHEN 5-325 MG PO TABS
ORAL_TABLET | ORAL | Status: AC
  Filled 2023-07-17: qty 2

## 2023-07-17 MED ORDER — MORPHINE SULFATE (PF) 4 MG/ML IV SOLN
0.5000 mg | INTRAVENOUS | Status: DC | PRN
  Administered 2023-07-18: 1 mg via INTRAVENOUS

## 2023-07-17 MED ORDER — TRAMADOL HCL 50 MG PO TABS
ORAL_TABLET | ORAL | Status: AC
  Filled 2023-07-17: qty 1

## 2023-07-17 MED ORDER — TRANEXAMIC ACID-NACL 1000-0.7 MG/100ML-% IV SOLN
INTRAVENOUS | Status: AC
  Filled 2023-07-17: qty 100

## 2023-07-17 MED ORDER — EPHEDRINE SULFATE-NACL 50-0.9 MG/10ML-% IV SOSY
PREFILLED_SYRINGE | INTRAVENOUS | Status: DC | PRN
  Administered 2023-07-17 (×2): 5 mg via INTRAVENOUS

## 2023-07-17 MED ORDER — DOCUSATE SODIUM 100 MG PO CAPS
100.0000 mg | ORAL_CAPSULE | Freq: Two times a day (BID) | ORAL | Status: DC
Start: 2023-07-17 — End: 2023-07-18
  Administered 2023-07-17 – 2023-07-18 (×2): 100 mg via ORAL

## 2023-07-17 MED ORDER — ORAL CARE MOUTH RINSE: 15.0000 mL | Freq: Once | OROMUCOSAL | Status: AC

## 2023-07-17 MED ORDER — TRAMADOL HCL 50 MG PO TABS
50.0000 mg | ORAL_TABLET | Freq: Four times a day (QID) | ORAL | Status: DC | PRN
Start: 2023-07-17 — End: 2023-07-18
  Administered 2023-07-17 – 2023-07-18 (×2): 50 mg via ORAL

## 2023-07-17 MED ORDER — FENTANYL CITRATE (PF) 100 MCG/2ML IJ SOLN
INTRAMUSCULAR | Status: DC | PRN
  Administered 2023-07-17 (×4): 25 ug via INTRAVENOUS

## 2023-07-17 MED ORDER — SODIUM CHLORIDE 0.9 % IV SOLN: INTRAVENOUS | Status: DC

## 2023-07-17 MED ORDER — CEFAZOLIN SODIUM-DEXTROSE 2-4 GM/100ML-% IV SOLN
2.0000 g | INTRAVENOUS | Status: AC
  Administered 2023-07-17: 2 g via INTRAVENOUS

## 2023-07-17 MED ORDER — DEXAMETHASONE SODIUM PHOSPHATE 10 MG/ML IJ SOLN
8.0000 mg | Freq: Once | INTRAMUSCULAR | Status: AC
  Administered 2023-07-17: 8 mg via INTRAVENOUS

## 2023-07-17 MED ORDER — PHENOL 1.4 % MT LIQD: 1.0000 | OROMUCOSAL | Status: DC | PRN

## 2023-07-17 MED ORDER — ASPIRIN 81 MG PO CHEW
81.0000 mg | CHEWABLE_TABLET | Freq: Every day | ORAL | Status: DC
  Administered 2023-07-18: 81 mg via ORAL

## 2023-07-17 MED ORDER — PANTOPRAZOLE SODIUM 40 MG PO TBEC
40.0000 mg | DELAYED_RELEASE_TABLET | Freq: Every day | ORAL | Status: DC
  Administered 2023-07-18: 40 mg via ORAL

## 2023-07-17 MED ORDER — OXYCODONE HCL 5 MG/5ML PO SOLN: 5.0000 mg | Freq: Once | ORAL | Status: DC | PRN

## 2023-07-17 MED ORDER — FENTANYL CITRATE (PF) 100 MCG/2ML IJ SOLN: 25.0000 ug | INTRAMUSCULAR | Status: DC | PRN

## 2023-07-17 MED ORDER — 0.9 % SODIUM CHLORIDE (POUR BTL) OPTIME
TOPICAL | Status: DC | PRN
  Administered 2023-07-17: 500 mL

## 2023-07-17 MED ORDER — ATORVASTATIN CALCIUM 20 MG PO TABS
80.0000 mg | ORAL_TABLET | Freq: Every day | ORAL | Status: DC
  Administered 2023-07-17: 80 mg via ORAL

## 2023-07-17 MED ORDER — CEFAZOLIN SODIUM-DEXTROSE 2-4 GM/100ML-% IV SOLN
INTRAVENOUS | Status: AC
  Filled 2023-07-17: qty 100

## 2023-07-17 MED ORDER — SODIUM CHLORIDE 0.9 % IR SOLN
Status: DC | PRN
  Administered 2023-07-17: 100 mL

## 2023-07-17 MED ORDER — ONDANSETRON HCL 4 MG PO TABS: 4.0000 mg | ORAL_TABLET | Freq: Four times a day (QID) | ORAL | Status: DC | PRN

## 2023-07-17 MED ORDER — DEXAMETHASONE SODIUM PHOSPHATE 10 MG/ML IJ SOLN
INTRAMUSCULAR | Status: AC
  Filled 2023-07-17: qty 1

## 2023-07-17 MED ORDER — ONDANSETRON HCL 4 MG/2ML IJ SOLN
INTRAMUSCULAR | Status: DC | PRN
  Administered 2023-07-17: 4 mg via INTRAVENOUS

## 2023-07-17 MED ORDER — ENOXAPARIN SODIUM 40 MG/0.4ML IJ SOSY
40.0000 mg | PREFILLED_SYRINGE | INTRAMUSCULAR | Status: DC
  Administered 2023-07-18: 40 mg via SUBCUTANEOUS

## 2023-07-17 MED ORDER — SURGIPHOR WOUND IRRIGATION SYSTEM - OPTIME: TOPICAL | Status: DC | PRN

## 2023-07-17 MED ORDER — HYDROCHLOROTHIAZIDE 25 MG PO TABS
25.0000 mg | ORAL_TABLET | Freq: Every day | ORAL | Status: DC
  Administered 2023-07-18: 25 mg via ORAL

## 2023-07-17 MED ORDER — TRANEXAMIC ACID-NACL 1000-0.7 MG/100ML-% IV SOLN
1000.0000 mg | INTRAVENOUS | Status: AC
  Administered 2023-07-17 (×2): 1000 mg via INTRAVENOUS

## 2023-07-17 MED ORDER — ONDANSETRON HCL 4 MG/2ML IJ SOLN: 4.0000 mg | Freq: Four times a day (QID) | INTRAMUSCULAR | Status: DC | PRN

## 2023-07-17 MED ORDER — LACTATED RINGERS IV SOLN: INTRAVENOUS | Status: DC

## 2023-07-17 MED ORDER — OXYCODONE-ACETAMINOPHEN 5-325 MG PO TABS
ORAL_TABLET | ORAL | Status: AC
  Filled 2023-07-17: qty 2

## 2023-07-17 MED ORDER — OXYCODONE HCL 5 MG PO TABS: 5.0000 mg | ORAL_TABLET | Freq: Once | ORAL | Status: DC | PRN

## 2023-07-17 MED ORDER — CHLORHEXIDINE GLUCONATE 0.12 % MT SOLN
OROMUCOSAL | Status: AC
  Filled 2023-07-17: qty 15

## 2023-07-17 MED ORDER — CEFAZOLIN SODIUM-DEXTROSE 2-4 GM/100ML-% IV SOLN
2.0000 g | Freq: Four times a day (QID) | INTRAVENOUS | Status: AC
  Administered 2023-07-17 (×2): 2 g via INTRAVENOUS

## 2023-07-17 MED ORDER — PHENYLEPHRINE HCL-NACL 20-0.9 MG/250ML-% IV SOLN
INTRAVENOUS | Status: DC | PRN
  Administered 2023-07-17: 30 ug/min via INTRAVENOUS

## 2023-07-17 MED ORDER — PHENYLEPHRINE 80 MCG/ML (10ML) SYRINGE FOR IV PUSH (FOR BLOOD PRESSURE SUPPORT)
PREFILLED_SYRINGE | INTRAVENOUS | Status: DC | PRN
  Administered 2023-07-17 (×2): 160 ug via INTRAVENOUS
  Administered 2023-07-17: 40 ug via INTRAVENOUS

## 2023-07-17 MED ORDER — PROPOFOL 10 MG/ML IV BOLUS
INTRAVENOUS | Status: AC
  Filled 2023-07-17: qty 20

## 2023-07-17 MED ORDER — BUPIVACAINE HCL (PF) 0.5 % IJ SOLN
INTRAMUSCULAR | Status: DC | PRN
  Administered 2023-07-17: 3 mL via INTRATHECAL

## 2023-07-17 MED ORDER — DOCUSATE SODIUM 100 MG PO CAPS
ORAL_CAPSULE | ORAL | Status: AC
  Filled 2023-07-17: qty 1

## 2023-07-17 MED ORDER — FENTANYL CITRATE (PF) 100 MCG/2ML IJ SOLN
INTRAMUSCULAR | Status: AC
  Filled 2023-07-17: qty 2

## 2023-07-17 SURGICAL SUPPLY — 72 items
ADH SKN CLS APL DERMABOND .7 (GAUZE/BANDAGES/DRESSINGS) ×1
AGENT HMST KT MTR STRL THRMB (HEMOSTASIS)
APL PRP STRL LF DISP 70% ISPRP (MISCELLANEOUS) ×1
BLADE CLIPPER SURG (BLADE) IMPLANT
BLADE SAGITTAL AGGR TOOTH XLG (BLADE) ×1 IMPLANT
BNDG CMPR 5X6 CHSV STRCH STRL (GAUZE/BANDAGES/DRESSINGS) ×2
BNDG COHESIVE 6X5 TAN ST LF (GAUZE/BANDAGES/DRESSINGS) ×2 IMPLANT
CHLORAPREP W/TINT 26 (MISCELLANEOUS) ×1 IMPLANT
DERMABOND ADVANCED .7 DNX12 (GAUZE/BANDAGES/DRESSINGS) ×1 IMPLANT
DRAPE C-ARM XRAY 36X54 (DRAPES) ×1 IMPLANT
DRAPE POUCH INSTRU U-SHP 10X18 (DRAPES) ×1 IMPLANT
DRAPE SHEET LG 3/4 BI-LAMINATE (DRAPES) ×3 IMPLANT
DRAPE TABLE BACK 80X90 (DRAPES) ×1 IMPLANT
DRSG MEPILEX SACRM 8.7X9.8 (GAUZE/BANDAGES/DRESSINGS) ×1 IMPLANT
DRSG OPSITE POSTOP 4X8 (GAUZE/BANDAGES/DRESSINGS) ×1 IMPLANT
ELECT BLADE 4.0 EZ CLEAN MEGAD (MISCELLANEOUS) ×1
ELECT REM PT RETURN 9FT ADLT (ELECTROSURGICAL) ×1
ELECTRODE BLDE 4.0 EZ CLN MEGD (MISCELLANEOUS) ×1 IMPLANT
ELECTRODE REM PT RTRN 9FT ADLT (ELECTROSURGICAL) ×1 IMPLANT
GLOVE BIO SURGEON STRL SZ8 (GLOVE) ×1 IMPLANT
GLOVE BIOGEL PI IND STRL 8 (GLOVE) ×1 IMPLANT
GLOVE PI ORTHO PRO STRL 7.5 (GLOVE) ×2 IMPLANT
GLOVE PI ORTHO PRO STRL SZ8 (GLOVE) ×2 IMPLANT
GLOVE SURG SYN 7.5 E (GLOVE) ×1 IMPLANT
GLOVE SURG SYN 7.5 PF PI (GLOVE) ×1 IMPLANT
GOWN SRG XL LVL 3 NONREINFORCE (GOWNS) ×1 IMPLANT
GOWN STRL NON-REIN TWL XL LVL3 (GOWNS) ×1
GOWN STRL REUS W/ TWL LRG LVL3 (GOWN DISPOSABLE) ×1 IMPLANT
GOWN STRL REUS W/ TWL XL LVL3 (GOWN DISPOSABLE) ×1 IMPLANT
GOWN STRL REUS W/TWL LRG LVL3 (GOWN DISPOSABLE) ×1
GOWN STRL REUS W/TWL XL LVL3 (GOWN DISPOSABLE) ×1
HANDLE YANKAUER SUCT OPEN TIP (MISCELLANEOUS) ×1 IMPLANT
HEAD CERAMIC FEMORAL 36MM (Head) IMPLANT
HOOD PEEL AWAY T7 (MISCELLANEOUS) ×2 IMPLANT
INSERT TRIDENT POLY 36 0DEG (Insert) IMPLANT
IV NS 100ML SINGLE PACK (IV SOLUTION) ×1 IMPLANT
KIT PATIENT CARE HANA TABLE (KITS) ×1 IMPLANT
LIGHT WAVEGUIDE WIDE FLAT (MISCELLANEOUS) ×1 IMPLANT
MANIFOLD NEPTUNE II (INSTRUMENTS) ×1 IMPLANT
MARKER SKIN DUAL TIP RULER LAB (MISCELLANEOUS) ×1 IMPLANT
MAT ABSORB FLUID 56X50 GRAY (MISCELLANEOUS) ×1 IMPLANT
NDL FILTER BLUNT 18X1 1/2 (NEEDLE) ×1 IMPLANT
NDL SAFETY ECLIPSE 18X1.5 (NEEDLE) ×1 IMPLANT
NDL SPNL 20GX3.5 QUINCKE YW (NEEDLE) ×1 IMPLANT
NEEDLE FILTER BLUNT 18X1 1/2 (NEEDLE) ×1 IMPLANT
NEEDLE SPNL 20GX3.5 QUINCKE YW (NEEDLE) ×1 IMPLANT
NS IRRIG 500ML POUR BTL (IV SOLUTION) ×1 IMPLANT
PACK HIP COMPR (MISCELLANEOUS) ×1 IMPLANT
PAD ARMBOARD 7.5X6 YLW CONV (MISCELLANEOUS) ×1 IMPLANT
SCREW HEX LP 6.5X20 (Screw) IMPLANT
SCREW HEX LP 6.5X30 (Screw) IMPLANT
SHELL CLUSTERHOLE ACETABULAR 5 (Shell) IMPLANT
SLEEVE SCD COMPRESS KNEE MED (STOCKING) ×1 IMPLANT
SOLUTION IRRIG SURGIPHOR (IV SOLUTION) ×1 IMPLANT
STEM HIGH OFFSET SZ5X105 HIGH (Stem) IMPLANT
SURGIFLO W/THROMBIN 8M KIT (HEMOSTASIS) IMPLANT
SUT BONE WAX W31G (SUTURE) ×1 IMPLANT
SUT DVC 2 QUILL PDO T11 36X36 (SUTURE) ×1 IMPLANT
SUT ETHIBOND 2 V 37 (SUTURE) ×1 IMPLANT
SUT QUILL MONODERM 3-0 PS-2 (SUTURE) ×1 IMPLANT
SUT SILK 0 (SUTURE) ×1
SUT SILK 0 30XBRD TIE 6 (SUTURE) ×1 IMPLANT
SUT VIC AB 0 CT1 36 (SUTURE) ×1 IMPLANT
SUT VIC AB 2-0 CT2 27 (SUTURE) ×2 IMPLANT
SYR 30ML LL (SYRINGE) ×2 IMPLANT
SYR TB 1ML LL NO SAFETY (SYRINGE) ×1 IMPLANT
TAPE MICROFOAM 4IN (TAPE) IMPLANT
TOWEL OR 17X26 4PK STRL BLUE (TOWEL DISPOSABLE) IMPLANT
TRAP FLUID SMOKE EVACUATOR (MISCELLANEOUS) ×1 IMPLANT
TRAY FOLEY SLVR 16FR LF STAT (SET/KITS/TRAYS/PACK) IMPLANT
WAND WEREWOLF FASTSEAL 6.0 (MISCELLANEOUS) ×1 IMPLANT
WATER STERILE IRR 1000ML POUR (IV SOLUTION) ×1 IMPLANT

## 2023-07-17 NOTE — Transfer of Care (Signed)
Immediate Anesthesia Transfer of Care Note  Patient: Andre Stone  Procedure(s) Performed: TOTAL HIP ARTHROPLASTY ANTERIOR APPROACH (Right: Hip)  Patient Location: PACU  Anesthesia Type:Spinal  Level of Consciousness: awake  Airway & Oxygen Therapy: Patient Spontanous Breathing  Post-op Assessment: Report given to RN and Post -op Vital signs reviewed and stable  Post vital signs: Reviewed and stable  Last Vitals:  Vitals Value Taken Time  BP 126/71 07/17/23 1305  Temp 36.1 C 07/17/23 1305  Pulse 72 07/17/23 1308  Resp 11 07/17/23 1308  SpO2 97 % 07/17/23 1308  Vitals shown include unfiled device data.  Last Pain:  Vitals:   07/17/23 0855  TempSrc: Temporal  PainSc: 2          Complications: No notable events documented.

## 2023-07-17 NOTE — Plan of Care (Signed)
  Problem: Nutrition: Goal: Adequate nutrition will be maintained Outcome: Progressing   Problem: Elimination: Goal: Will not experience complications related to urinary retention Outcome: Progressing   Problem: Pain Management: Goal: General experience of comfort will improve Outcome: Progressing   Problem: Safety: Goal: Ability to remain free from injury will improve Outcome: Progressing

## 2023-07-17 NOTE — H&P (Signed)
History of Present Illness: Andre Stone is an 77 y.o. male who presents for history and physical for right anterior total hip arthroplasty with Dr. Audelia Acton on 07/17/2023. Patient has advanced right hip osteoarthritis with increasing pain of the last 2 years. He describes severe discomfort with sharp pains along the groin, lateral hip and posterior hip. He reports the pain is worse when walking or with extended activity or exercise. He reports it is limiting him from walking and participating in activities on a daily basis and he has had several episodes where the leg nearly gave out on him because of his hip pain. He is undergone previous physical therapy, home exercises, and 3 hip injections. The last hip injection was in May of this year and he reports that the first 2 shots were significantly improving his pain but this last shot only gave about half relief lasting only about a month. He takes Tylenol or Excedrin as needed for the pain but is unable to take high-dose anti-inflammatories due to kidney issues. At this point the patient is not interested in further injections or conservative measures for his hip and wants to discuss possibly fixing his right hip. The patient denies fevers, chills, numbness, tingling, shortness of breath, chest pain, recent illness, or any trauma.  Patient is a non-smoker, nondiabetic with a hemoglobin A1c of 6.2, and a BMI of 27.1  Past Medical History: Past Medical History:  Diagnosis Date  Allergic state Penicillen7  Anemia  Chronic low grade  GERD (gastroesophageal reflux disease) Medication controlled  History of cataract  Left eye but not severe enough for surgery  Hyperlipidemia Lovastatin  Hypertension  Insomnia  Kidney stones  Myocardial infarction (CMS/HHS-HCC) Possible small event based on differences in test  Reflux  Sleep apnea  Tinnitus  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(CMS/HHS-HCC)   Past Surgical History: Past Surgical History:   Procedure Laterality Date  TONSILLECTOMY 1957  COLONOSCOPY 08/12/2002  Hyperplastic Polyps  ERCP 06/15/2003  UPPER EUS 07/06/2003  [facility]  COLONOSCOPY 10/23/2012  PH Adenomatous Polyps: CBF 09/2017; Recall Ltr mailed 09/09/2017 (dh)  uteroscopy with laser to remove stones 12/29/2015  COLONOSCOPY 01/01/2018  PH Adenomatous Polyps: CBF 12/2022  CHOLECYSTECTOMY  COLONOSCOPY 03/06/2007, 01/05/2004  Adenomatous Polyps  EGD 06/09/2003, 08/12/2002  ureteroscopic removal of an 8 mm right proximal ureteral calculus on 11/02/2019   Past Family History: Family History  Problem Relation Age of Onset  Stroke Mother  Aortic aneurysm Mother  Aortic aneurysm Father  High blood pressure (Hypertension) Father  No Known Problems Sister   Medications: Current Outpatient Medications  Medication Sig Dispense Refill  atorvastatin (LIPITOR) 80 MG tablet take 1 tablet by mouth nightly 90 tablet 1  cholecalciferol (VITAMIN D3) 1000 unit capsule Take 1,000 Units by mouth once daily  cholestyramine (QUESTRAN) 4 gram oral powder Take 4 g by mouth every morning before breakfast Mix dose in 60-180 mL of water, milk or juice. AS NEEDED 378 g 3  docosahexaenoic acid/epa (FISH OIL ORAL) Take by mouth once daily  esomeprazole (NEXIUM) 20 MG DR capsule Take 1 capsule (20 mg total) by mouth once daily. 30 capsule 11  fluticasone (FLONASE) 50 mcg/actuation nasal spray Place 2 sprays into both nostrils nightly as needed for Rhinitis. 48 g 1  hydroCHLOROthiazide (HYDRODIURIL) 25 MG tablet TAKE ONE TABLET (25 MG) BY MOUTH EVERY DAY 90 tablet 1  lisinopriL (ZESTRIL) 40 MG tablet TAKE 1 TABLET BY MOUTH DAILY 90 tablet 1  multivitamin tablet Take 1 tablet by mouth once  daily.  phenylephrine/acetaminophn/cpm (DECONGESTANT ORAL) Take 1 tablet by mouth once daily  sildenafil (REVATIO) 20 mg tablet 2 OR 3 TABLETS BY MOUTH EVERY DAY AS NEEDED 30 tablet 1  traZODone (DESYREL) 150 MG tablet TAKE 1 TABLET BY MOUTH AT BEDTIME AS  NEEDED FOR SLEEP 90 tablet 1   No current facility-administered medications for this visit.   Allergies: Allergies  Allergen Reactions  Chondroitin Complex [Glucosamine-Chondroit-Vit C-Mn] Swelling  Tongue swelling  Penicillins Itching    Visit Vitals: Vitals:  07/08/23 0859  BP: 118/80    Review of Systems:  A comprehensive 14 point ROS was performed, reviewed, and the pertinent orthopaedic findings are documented in the HPI.  Physical Exam: Body mass index is 27.4 kg/m. General:  Well developed, well nourished, no apparent distress, normal affect, antalgic gait with no assistive device  HEENT: Head normocephalic, atraumatic, PERRL.   Abdomen: Soft, non tender, non distended, Bowel sounds present.  Heart: Examination of the heart reveals regular, rate, and rhythm. There is no murmur noted on ascultation. There is a normal apical pulse.  Lungs: Lungs are clear to auscultation. There is no wheeze, rhonchi, or crackles. There is normal expansion of bilateral chest walls.   Right hip exam  SKIN: intact SWELLING: none WARMTH: no warmth TENDERNESS: none, Stinchfield Positive ROM: 0 degrees internal rotation and 30 degrees external rotation and pain with internal rotation, localized to the lateral hip reproducing his pain; Hip Flexion 85 STRENGTH: normal GAIT: stiff-legged STABILITY: stable to testing CREPITUS: no LEG LENGTH DISCREPANCY: left longer by .2 cm NEUROLOGICAL EXAM: normal VASCULAR EXAM: normal LUMBAR SPINE: tenderness: no straight leg raising sign: no motor exam: normal  The contralateral hip was examined for comparison and it showed: TENDERNESS: none ROM: normal and full STRENGTH: normal STABILITY: stable to testing  Hip Imaging :  I have reviewed AP pelvis and lateral hip X-rays (3 views) taken today in the office from 2 months ago which reveal moderate/severe degenerative changes of the right hip with joint space narrowing and osteophyte  formation, subchondral cysts and sclerosis with femoral head deformity with flattening of the superior lateral aspect of the head and a cam lesion as well as an inferior osteophyte. The left hip is noted to have mild degenerative changes with some medial and superior joint space narrowing and mild acetabular sclerosis otherwise bony morphology of the femoral head is normal. No fractures or dislocations noted in either hip.   Assessment:  Encounter Diagnosis  Name Primary?  Primary osteoarthritis of right hip Yes   Right hip osteoarthritis  Plan: Remus is a 77 year old male who presents with complete loss of joint space in his right hip joint. He has advanced right hip right hip osteoarthritis. He has had 2 years of increasing pain despite conservative treatment with injections, medications. Pain interferes with his quality of life and activities of daily living. He has seen Dr. Audelia Acton, discussed total hip arthroplasty and agreed and consented procedure with Dr. Audelia Acton on 07/17/2023.  The hospitalization and post-operative care and rehabilitation were also discussed. The use of perioperative antibiotics and DVT prophylaxis were discussed. The risk, benefits and alternatives to a surgical intervention were discussed at length with the patient. The patient was also advised of risks related to the medical comorbidities and elevated body mass index (BMI). A lengthy discussion took place to review the most common complications including but not limited to: deep vein thrombosis, pulmonary embolus, heart attack, stroke, infection, wound breakdown, heterotopic ossification, dislocation, numbness, leg length in-equality,  intraoperative fracture, damage to nerves, tendon,muscles, arteries or other blood vessels, death and other possible complications from anesthesia. The patient was told that we will take steps to minimize these risks by using sterile technique, antibiotics and DVT prophylaxis when appropriate  and follow the patient postoperatively in the office setting to monitor progress. The possibility of recurrent pain, no improvement in pain and actual worsening of pain were also discussed with the patient. The risk of dislocation following total hip replacement was discussed and potential precautions to prevent dislocation were reviewed.   All questions answered patient agrees with above plan to proceed with right anterior total hip arthroplasty.

## 2023-07-17 NOTE — Interval H&P Note (Signed)
Patient history and physical updated. Consent reviewed including risks, benefits, and alternatives to surgery. Patient agrees with above plan to proceed with right anterior total hip arthroplasty.

## 2023-07-17 NOTE — Anesthesia Procedure Notes (Addendum)
Procedure Name: MAC Date/Time: 07/17/2023 11:10 AM  Performed by: Cheral Bay, CRNAPre-anesthesia Checklist: Patient identified, Emergency Drugs available, Suction available, Patient being monitored and Timeout performed Patient Re-evaluated:Patient Re-evaluated prior to induction Oxygen Delivery Method: Supernova nasal CPAP Induction Type: IV induction Placement Confirmation: positive ETCO2 and CO2 detector

## 2023-07-17 NOTE — Evaluation (Signed)
Physical Therapy Evaluation Patient Details Name: Andre Stone MRN: [id number] DOB: June 15, 1946 Today's Date: 07/17/2023  History of Present Illness  Pt is a 77 y.o. male s/p R THA secondary to R hip OA 07/17/23.  PMH includes sleep apnea (CPAP), htn, h/o MI, anemia, borderline DM, diverticulosis, insomnia, urothelial carcinoma of bladder.  Clinical Impression  Prior to surgery, pt was independent with ambulation; lives with his wife (who is a retired Engineer, civil (consulting)) on main level of home with 4 STE R railing.  2-3/10 R hip pain throughout session.  Tolerated LE ex's in bed fairly well with assist as needed.  Currently pt is SBA semi-supine to sitting EOB; CGA with transfers using RW; and CGA to ambulate a few feet bed to recliner with RW use.  Pt reporting mild dizziness when taking steps to recliner; pt's BP 142/85 with HR 84 bpm and SpO2 sats 94% on room air sitting in recliner (nurse notified).  Pt would currently benefit from skilled PT to address noted impairments and functional limitations (see below for any additional details).  Upon hospital discharge, pt would benefit from ongoing therapy.  Pt's nurse and TOC notified regarding DME needs via secure messaging.    If plan is discharge home, recommend the following: A little help with walking and/or transfers;A little help with bathing/dressing/bathroom;Assistance with cooking/housework;Assist for transportation;Help with stairs or ramp for entrance   Can travel by private vehicle    Yes    Equipment Recommendations Rolling walker (2 wheels);BSC/3in1  Recommendations for Other Services       Functional Status Assessment Patient has had a recent decline in their functional status and demonstrates the ability to make significant improvements in function in a reasonable and predictable amount of time.     Precautions / Restrictions Precautions Precautions: Fall;Anterior Hip Precaution Booklet Issued: Yes (comment) Restrictions Weight  Bearing Restrictions: Yes RLE Weight Bearing: Weight bearing as tolerated      Mobility  Bed Mobility Overal bed mobility: Needs Assistance Bed Mobility: Supine to Sit     Supine to sit: Supervision, HOB elevated     General bed mobility comments: mild increased effort to perform on own    Transfers Overall transfer level: Needs assistance Equipment used: Rolling walker (2 wheels) Transfers: Sit to/from Stand Sit to Stand: Contact guard assist           General transfer comment: vc's for UE placement and overall technique    Ambulation/Gait Ambulation/Gait assistance: Contact guard assist Gait Distance (Feet): 3 Feet (bed to recliner) Assistive device: Rolling walker (2 wheels)   Gait velocity: decreased     General Gait Details: antalgic; decreased stance time R LE; vc's for walker use and overall technique  Stairs            Wheelchair Mobility     Tilt Bed    Modified Rankin (Stroke Patients Only)       Balance Overall balance assessment: Needs assistance Sitting-balance support: No upper extremity supported, Feet supported Sitting balance-Leahy Scale: Good Sitting balance - Comments: steady reaching within BOS   Standing balance support: Bilateral upper extremity supported, Reliant on assistive device for balance Standing balance-Leahy Scale: Fair Standing balance comment: steady static standing with B UE support on RW                             Pertinent Vitals/Pain Pain Assessment Pain Assessment: 0-10 Pain Score: 3  Pain Location: R hip Pain  Descriptors / Indicators: Sore Pain Intervention(s): Limited activity within patient's tolerance, Monitored during session, Repositioned, Ice applied, Other (comment) (RN notified) Vitals (HR and SpO2 on room air) stable and WFL throughout treatment session.    Home Living Family/patient expects to be discharged to:: Private residence Living Arrangements: Spouse/significant  other Available Help at Discharge: Family;Available 24 hours/day Type of Home: House Home Access: Stairs to enter Entrance Stairs-Rails: Right Entrance Stairs-Number of Steps: 4 (from garage)   Home Layout: Two level;Able to live on main level with bedroom/bathroom Home Equipment: Gilmer Mor - single point      Prior Function Prior Level of Function : Independent/Modified Independent             Mobility Comments: No recent falls reported.       Extremity/Trunk Assessment   Upper Extremity Assessment Upper Extremity Assessment: Overall WFL for tasks assessed    Lower Extremity Assessment Lower Extremity Assessment: RLE deficits/detail (L LE WFL) RLE Deficits / Details: at least 3/5 AROM hip flexion and knee flexion/extension; DF at least 4+/5    Cervical / Trunk Assessment Cervical / Trunk Assessment: Normal  Communication   Communication Communication:  (appearing mildly HOH) Cueing Techniques: Verbal cues;Visual cues  Cognition Arousal: Alert Behavior During Therapy: WFL for tasks assessed/performed Overall Cognitive Status: Within Functional Limits for tasks assessed                                          General Comments General comments (skin integrity, edema, etc.): no drainage noted from R hip honeycomb dressing beginning/end of session.  Nursing cleared pt for participation in physical therapy.  Pt agreeable to PT session.  Pt's wife present during part of session.    Exercises Total Joint Exercises Ankle Circles/Pumps: AROM, Strengthening, Both, 10 reps, Supine Quad Sets: AROM, Strengthening, Both, 10 reps, Supine Gluteal Sets: AROM, Strengthening, Both, 10 reps, Supine Towel Squeeze: AROM, Strengthening, Both, 10 reps, Supine Short Arc Quad: AROM, Strengthening, Right, 10 reps, Supine Heel Slides: AAROM, Strengthening, Right, 10 reps, Supine Hip ABduction/ADduction: AAROM, Strengthening, Right, 10 reps, Supine   Assessment/Plan    PT  Assessment Patient needs continued PT services  PT Problem List Decreased strength;Decreased activity tolerance;Decreased balance;Decreased mobility;Decreased knowledge of use of DME;Decreased knowledge of precautions;Pain;Decreased skin integrity       PT Treatment Interventions DME instruction;Gait training;Stair training;Functional mobility training;Therapeutic activities;Therapeutic exercise;Balance training;Patient/family education    PT Goals (Current goals can be found in the Care Plan section)  Acute Rehab PT Goals Patient Stated Goal: to improve walking PT Goal Formulation: With patient Time For Goal Achievement: 07/31/23 Potential to Achieve Goals: Good    Frequency BID     Co-evaluation               AM-PAC PT "6 Clicks" Mobility  Outcome Measure Help needed turning from your back to your side while in a flat bed without using bedrails?: None Help needed moving from lying on your back to sitting on the side of a flat bed without using bedrails?: A Little Help needed moving to and from a bed to a chair (including a wheelchair)?: A Little Help needed standing up from a chair using your arms (e.g., wheelchair or bedside chair)?: A Little Help needed to walk in hospital room?: A Little Help needed climbing 3-5 steps with a railing? : A Little 6 Click Score: 19  End of Session Equipment Utilized During Treatment: Gait belt Activity Tolerance: Patient tolerated treatment well Patient left: in chair;with call bell/phone within reach;with family/visitor present;with SCD's reapplied;Other (comment) (ice pack to R hip) Nurse Communication: Mobility status;Precautions;Weight bearing status;Other (comment) (pt's pain status) PT Visit Diagnosis: Other abnormalities of gait and mobility (R26.89);Muscle weakness (generalized) (M62.81);Pain Pain - Right/Left: Right Pain - part of body: Hip    Time: 1308-6578 PT Time Calculation (min) (ACUTE ONLY): 36 min   Charges:   PT  Evaluation $PT Eval Low Complexity: 1 Low PT Treatments $Therapeutic Exercise: 8-22 mins $Therapeutic Activity: 8-22 mins PT General Charges $$ ACUTE PT VISIT: 1 Visit        Hendricks Limes, PT 07/17/23, 5:47 PM

## 2023-07-17 NOTE — Op Note (Signed)
Patient Name: Andre Stone  UEA:[id number]  Pre-Operative Diagnosis: Right hip Osteoarthritis  Post-Operative Diagnosis: (same)  Procedure: Right Total Hip Arthroplasty  Components/Implants: Cup: Trident Tritanium Clusterhole 52/E w/ x2 screws   Liner: Neutral X3 poly 36/E  Stem: Insignia #5 High Offset  Head:Biolox Ceramic 36mm +62mm  Date of Surgery: 07/17/2023  Surgeon: Reinaldo Berber MD  Assistant: Amador Cunas PA (present and scrubbed throughout the case, critical for assistance with exposure, retraction, instrumentation, and closure), Minor PAS   Anesthesiologist: Lorette Ang  Anesthesia: Spinal   EBL: 150cc  IVF:700cc  Complications: None   Brief history: The patient is a 77 year old male with a history of osteoarthritis of the right hip with pain limiting their range of motion and activities of daily living, which has failed multiple attempts at conservative therapy.  The risks and benefits of total hip arthroplasty as definitive surgical treatment were discussed with the patient, who opted to proceed with the operation.  After outpatient medical clearance and optimization was completed the patient was admitted to [facility] for the procedure.  All preoperative films were reviewed and an appropriate surgical plan was made prior to surgery.   Description of procedure: The patient was brought to the operating room where laterality was confirmed by all those present to be the right side.  The patient was administered spinal anesthesia on a stretcher prior to being moved supine on the operating room table. Patient was given an intravenous dose of antibiotics for surgical prophylaxis and TXA.  All bony prominences and extremities were well padded and the patient was securely attached to the table boots, a perineal post was placed and the patient had a safety strap placed.  Surgical site was prepped with alcohol and chlorhexidine. The surgical site over the hip  was and draped in typical sterile fashion with multiple layers of adhesive and nonadhesive drapes.  The incision site was marked out with a sterile marker and care was taken to assess the position of the ASIS and ensure appropriate position for the incision.    A surgical timeout was then called with participation of all staff in the room the patient was then a confirmed again and laterality confirmed.  Incision was made over the anterior lateral aspect of the proximal thigh in line with the TFL.  Appropriate retractors were placed and all bleeding vessels were coagulated within the subcutaneous and fatty layers.  An incision was made in the TFL fascia in the interval was carefully identified.  The lateral ascending branches of the circumflex vessels were identified, cauterized and carefully dissected. Retractors were placed around the superior lateral and inferior medial aspects of the femoral neck and a capsulotomy was performed exposing the hip joint.  Retraction stitches were placed and the capsulotomy to assist with visualization.  Femoral neck cut was then made and the femoral head was extracted after placing the leg in traction.  Bone wax was then applied to the proximal cut surface of the femur and aqua mantis was used to address any bleeding around the femoral neck cut.  Retractors were then placed around the acetabulum to fully visualize the joint space, and the remaining labral tissue was removed and pulvinar was removed.   The acetabulum was then sequentially reamed up to the appropriate size in order to get good fit and fill for the acetabular component while under fluoroscopic guidance.  Acetabular component was then placed and malleted into a secure fit while confirming position and abduction angle and anteversion utilizing  fluoroscopy.  2 screws were then placed in the acetabular cup to assist in securing the cup in place. The cup was irrigated,  a real neutral liner was placed, impacted, and  checked for stability. The femur traction was dropped and sequentially externally rotated while performing a release of the posterior and superomedial tissues off of the proximal femur to allow for mobility, care was taken to preserve the external rotators and piriformis attachments.  The remaining interval between the abductors and the capsule was dissected out and a retractor was placed over the superolateral aspect of the femur over the greater trochanter.  The leg was carefully brought down into extension and adducted to provide visualization of the proximal femur for broaching.  The femur was then sequentially broached up to an appropriate size which provided for good fill and stability to the femoral broach.  A trial neck and head were placed on the femoral broach and the leg was brought up for reduction.  The hip was reduced and manual check of stability was performed.  The hip was found to be stable in flexion internal rotation and extension external rotation.  Leg lengths were confirmed on fluoroscopy.   The hip was then dislocated the trial neck and head were removed.  The leg was then brought down into extension and adduction in the proximal femur was reexposed.  The broach trial was removed and the femur was irrigated with normal saline prior to the real femoral stem being implanted.  After the femoral stem was seated and shown to have good fit and fill the appropriate head was impacted the leg was brought up and reduced.  There was good range of motion with stability in flexion internal rotation and extension external rotation on testing.  Leg lengths were found to be appropriate on fluoroscopic evaluation at this time.  The hip was then irrigated with betdine based surgiphor solution and then saline solution.  The capsulotomy was repaired with Ethibond sutures.  A pericapsular and peritrochanteric cocktail with Exparel and bupivacaine was then injected as well as the subcutaneous tissues. The fascia  was closed with a #1 barbed running suture.  The deep tissues were closed with Vicryl sutures the subcutaneous tissues were closed with interrupted Vicryl sutures and a running barbed 4-0 suture.  The skin was then reinforced with Dermabond and a sterile dressing was placed.   The patient was awoken from anesthesia transferred off of the operating room table onto a hospital bed where examination of leg lengths found the leg lengths to be equal with a good distal pulse.  The patient was then transferred to the PACU in stable condition.

## 2023-07-17 NOTE — Anesthesia Preprocedure Evaluation (Signed)
Anesthesia Evaluation  Patient identified by MRN, date of birth, ID band Patient awake    Reviewed: Allergy & Precautions, NPO status , Patient's Chart, lab work & pertinent test results  History of Anesthesia Complications Negative for: history of anesthetic complications  Airway Mallampati: III  TM Distance: >3 FB Neck ROM: Full    Dental  (+) Missing, Dental Advidsory Given   Pulmonary neg pulmonary ROS, sleep apnea and Continuous Positive Airway Pressure Ventilation    Pulmonary exam normal breath sounds clear to auscultation       Cardiovascular hypertension, + Past MI  negative cardio ROS Normal cardiovascular exam+ dysrhythmias  Rhythm:Regular Rate:Normal  ECG 01/07/21:  Sinus rhythm IVCD, consider atypical RBBB  Myocardial perfusion 10/03/17:  LVEF= 62%  Regional wall motion:reveals normal myocardial thickening and wall motion.  The overall quality of the study is excellent.  Artifacts noted: no  Left ventricular cavity: normal.  Perfusion Analysis:SPECT images demonstrate homogeneous tracer distribution throughout the myocardium.   Neuro/Psych negative neurological ROS  negative psych ROS   GI/Hepatic negative GI ROS, Neg liver ROS,GERD  ,,Diverticulitis    Endo/Other  negative endocrine ROS  Prediabetes   Renal/GU      Musculoskeletal   Abdominal   Peds  Hematology negative hematology ROS (+)   Anesthesia Other Findings Past Medical History: No date: Anemia No date: Aortic atherosclerosis (HCC) 01/27/2014: Borderline diabetes mellitus No date: Cataract 12/25/2015: Community acquired pneumonia No date: Coronary artery disease involving native coronary artery of  native heart with angina pectoris (HCC)     Comment:  a.) cCTA 08/29/2022: Ca2+ = 937 (77th %'ile for               age/sex/race match control); 50-60% pLAD, 25-49% pRCA, <               25% dLM and LCx --> FFRct : LAD (0.85),  LCx (0.97), RCA               (0.94) No date: Diverticulitis large intestine 08/29/2022: Ectatic thoracic aorta (HCC)     Comment:  a.) cCTA 08/29/2022: asc Ao = 3.9 cm No date: Erectile dysfunction     Comment:  a.) on PDE5i (sildenafil) PRN 12/28/2015: Essential (primary) hypertension 12/28/2015: Gastro-esophageal reflux disease without esophagitis No date: History of kidney stones No date: Hypertension No date: Insomnia     Comment:  a.) takes trazodone PRN No date: Long term current use of aspirin No date: Mixed hyperlipidemia No date: Myocardial infarction [facility])     Comment:  possible small event EKG changes No date: OSA on CPAP No date: Osteoarthritis No date: RBBB (right bundle branch block) No date: 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(HCC)  Past Surgical History: No date: CHOLECYSTECTOMY No date: COLONOSCOPY     Comment:  2003, 2005, 2008 10/23/2012: COLONOSCOPY 01/01/2018: COLONOSCOPY 11/02/2019: CYSTOSCOPY W/ RETROGRADES; Right     Comment:  Procedure: CYSTOSCOPY WITH RETROGRADE PYELOGRAM;                Surgeon: Riki Altes, MD;  Location: [facility] ORS;                Service: Urology;  Laterality: Right; 01/08/2022: CYSTOSCOPY W/ RETROGRADES; N/A     Comment:  Procedure: CYSTOSCOPY WITH RETROGRADE PYELOGRAM/ BLADDER              BIOPSY;  Surgeon: Riki Altes, MD;  Location: [facility]  ORS;  Service: Urology;  Laterality: N/A; 11/02/2019: CYSTOSCOPY WITH BIOPSY; N/A     Comment:  Procedure: CYSTOSCOPY WITH BIOPSY;  Surgeon: Riki Altes, MD;  Location: [facility] ORS;  Service: Urology;                Laterality: N/A; 01/08/2022: CYSTOSCOPY/RETROGRADE/URETEROSCOPY/STONE EXTRACTION WITH  BASKET; Right     Comment:  Procedure: CYSTOSCOPY/RETROGRADE/URETEROSCOPY/STONE               EXTRACTION WITH BASKET;  Surgeon: Riki Altes, MD;                Location: [facility] ORS;  Service: Urology;  Laterality:               Right; 12/29/2015:  CYSTOSCOPY/URETEROSCOPY/HOLMIUM LASER/STENT PLACEMENT;  Right     Comment:  Procedure: CYSTOSCOPY/URETEROSCOPY/ with basketing of               stone, right ureteral /STENT PLACEMENT;  Surgeon:               Sebastian Ache, MD;  Location: [facility] ORS;  Service:               Urology;  Laterality: Right; 11/02/2019: CYSTOSCOPY/URETEROSCOPY/HOLMIUM LASER/STENT PLACEMENT;  Right     Comment:  Procedure: CYSTOSCOPY/URETEROSCOPY/HOLMIUM LASER/STENT               PLACEMENT;  Surgeon: Riki Altes, MD;  Location:               [facility] ORS;  Service: Urology;  Laterality: Right; 01/08/2022: CYSTOSCOPY/URETEROSCOPY/HOLMIUM LASER/STENT PLACEMENT;  Right     Comment:  Procedure: CYSTOSCOPY/URETEROSCOPY/HOLMIUM LASER/STENT               PLACEMENT;  Surgeon: Riki Altes, MD;  Location:               [facility] ORS;  Service: Urology;  Laterality: Right; 06/15/2003: ERCP No date: ESOPHAGOGASTRODUODENOSCOPY     Comment:  2003, 2004 1957: TONSILLECTOMY 07/06/2003: UPPER ESOPHAGEAL ENDOSCOPIC ULTRASOUND (EUS)  BMI    Body Mass Index: 26.58 kg/m      Reproductive/Obstetrics negative OB ROS                             Anesthesia Physical Anesthesia Plan  ASA: 3  Anesthesia Plan: Spinal and General   Post-op Pain Management:    Induction:   PONV Risk Score and Plan: 2 and Ondansetron, Dexamethasone, Propofol infusion, TIVA and Midazolam  Airway Management Planned: Natural Airway and Nasal Cannula  Additional Equipment:   Intra-op Plan:   Post-operative Plan:   Informed Consent: I have reviewed the patients History and Physical, chart, labs and discussed the procedure including the risks, benefits and alternatives for the proposed anesthesia with the patient or authorized representative who has indicated his/her understanding and acceptance.     Dental Advisory Given  Plan Discussed with: Anesthesiologist, CRNA and Surgeon  Anesthesia Plan Comments: (Patient  reports no bleeding problems and no anticoagulant use.  Plan for spinal with backup GA  Patient consented for risks of anesthesia including but not limited to:  - adverse reactions to medications - damage to eyes, teeth, lips or other oral mucosa - nerve damage due to positioning  - risk of bleeding, infection and or nerve damage from spinal that could lead to paralysis - risk of headache  or failed spinal - damage to teeth, lips or other oral mucosa - sore throat or hoarseness - damage to heart, brain, nerves, lungs, other parts of body or loss of life  Patient voiced understanding and assent.)        Anesthesia Quick Evaluation

## 2023-07-17 NOTE — Anesthesia Procedure Notes (Signed)
Spinal  Patient location during procedure: OR Start time: 07/17/2023 10:45 AM End time: 07/17/2023 10:48 AM Reason for block: surgical anesthesia Staffing Performed: anesthesiologist  Anesthesiologist: Stephanie Coup, MD Performed by: Stephanie Coup, MD Authorized by: Stephanie Coup, MD   Preanesthetic Checklist Completed: patient identified, IV checked, site marked, risks and benefits discussed, surgical consent, monitors and equipment checked, pre-op evaluation and timeout performed Spinal Block Patient position: sitting Prep: Betadine Patient monitoring: heart rate, continuous pulse ox, blood pressure and cardiac monitor Approach: midline Location: L4-5 Injection technique: single-shot Needle Needle type: Whitacre and Introducer  Needle gauge: 24 G Needle length: 9 cm Assessment Sensory level: T4 Events: CSF return

## 2023-07-18 ENCOUNTER — Encounter: Payer: Self-pay | Admitting: Orthopedic Surgery

## 2023-07-18 DIAGNOSIS — M1611 Unilateral primary osteoarthritis, right hip: Secondary | ICD-10-CM | POA: Diagnosis not present

## 2023-07-18 LAB — CBC
HCT: 33.2 % — ABNORMAL LOW (ref 39.0–52.0)
Hemoglobin: 11.5 g/dL — ABNORMAL LOW (ref 13.0–17.0)
MCH: 30.1 pg (ref 26.0–34.0)
MCHC: 34.6 g/dL (ref 30.0–36.0)
MCV: 86.9 fL (ref 80.0–100.0)
Platelets: 152 10*3/uL (ref 150–400)
RBC: 3.82 MIL/uL — ABNORMAL LOW (ref 4.22–5.81)
RDW: 13.2 % (ref 11.5–15.5)
WBC: 11.7 10*3/uL — ABNORMAL HIGH (ref 4.0–10.5)
nRBC: 0 % (ref 0.0–0.2)

## 2023-07-18 LAB — BASIC METABOLIC PANEL
Anion gap: 9 (ref 5–15)
BUN: 23 mg/dL (ref 8–23)
CO2: 25 mmol/L (ref 22–32)
Calcium: 8.4 mg/dL — ABNORMAL LOW (ref 8.9–10.3)
Chloride: 102 mmol/L (ref 98–111)
Creatinine, Ser: 1.12 mg/dL (ref 0.61–1.24)
GFR, Estimated: >60 mL/min (ref >60)
Glucose, Bld: 126 mg/dL — ABNORMAL HIGH (ref 70–99)
Potassium: 3.8 mmol/L (ref 3.5–5.1)
Sodium: 136 mmol/L (ref 135–145)

## 2023-07-18 MED ORDER — ONDANSETRON HCL 4 MG PO TABS: 4.0000 mg | ORAL_TABLET | Freq: Four times a day (QID) | ORAL | 0 refills | Status: DC | PRN

## 2023-07-18 MED ORDER — ENOXAPARIN SODIUM 40 MG/0.4ML IJ SOSY: 40.0000 mg | PREFILLED_SYRINGE | INTRAMUSCULAR | 0 refills | Status: DC

## 2023-07-18 MED ORDER — DOCUSATE SODIUM 100 MG PO CAPS: 100.0000 mg | ORAL_CAPSULE | Freq: Two times a day (BID) | ORAL | 0 refills | Status: DC

## 2023-07-18 MED ORDER — TRAMADOL HCL 50 MG PO TABS: 50.0000 mg | ORAL_TABLET | Freq: Four times a day (QID) | ORAL | 0 refills | Status: DC | PRN

## 2023-07-18 MED ORDER — ASPIRIN 81 MG PO CHEW
CHEWABLE_TABLET | ORAL | Status: AC
Start: 2023-07-18 — End: ?
  Filled 2023-07-18: qty 1

## 2023-07-18 MED ORDER — HYDROCODONE-ACETAMINOPHEN 5-325 MG PO TABS
ORAL_TABLET | ORAL | Status: AC
  Filled 2023-07-18: qty 2

## 2023-07-18 MED ORDER — CELECOXIB 100 MG PO CAPS: 100.0000 mg | ORAL_CAPSULE | Freq: Two times a day (BID) | ORAL | 0 refills | Status: AC

## 2023-07-18 MED ORDER — HYDROCHLOROTHIAZIDE 25 MG PO TABS
ORAL_TABLET | ORAL | Status: AC
  Filled 2023-07-18: qty 1

## 2023-07-18 MED ORDER — DOCUSATE SODIUM 100 MG PO CAPS
ORAL_CAPSULE | ORAL | Status: AC
  Filled 2023-07-18: qty 1

## 2023-07-18 MED ORDER — TRAMADOL HCL 50 MG PO TABS
ORAL_TABLET | ORAL | Status: AC
  Filled 2023-07-18: qty 1

## 2023-07-18 MED ORDER — LISINOPRIL 20 MG PO TABS
ORAL_TABLET | ORAL | Status: AC
  Filled 2023-07-18: qty 2

## 2023-07-18 MED ORDER — MORPHINE SULFATE (PF) 4 MG/ML IV SOLN
INTRAVENOUS | Status: AC
  Filled 2023-07-18: qty 1

## 2023-07-18 MED ORDER — HYDROCODONE-ACETAMINOPHEN 5-325 MG PO TABS: 1.0000 | ORAL_TABLET | ORAL | 0 refills | Status: DC | PRN

## 2023-07-18 MED ORDER — ENOXAPARIN SODIUM 40 MG/0.4ML IJ SOSY
PREFILLED_SYRINGE | INTRAMUSCULAR | Status: AC
  Filled 2023-07-18: qty 0.4

## 2023-07-18 MED ORDER — PANTOPRAZOLE SODIUM 40 MG PO TBEC
DELAYED_RELEASE_TABLET | ORAL | Status: AC
Start: 2023-07-18 — End: ?
  Filled 2023-07-18: qty 1

## 2023-07-18 NOTE — Plan of Care (Signed)
progressing 

## 2023-07-18 NOTE — Progress Notes (Signed)
Physical Therapy Treatment Patient Details Name: Andre Stone MRN: [id number] DOB: 06-May-1946 Today's Date: 07/18/2023   History of Present Illness Pt is a 77 y.o. male s/p R THA secondary to R hip OA 07/17/23.  PMH includes sleep apnea (CPAP), htn, h/o MI, anemia, borderline DM, diverticulosis, insomnia, urothelial carcinoma of bladder.    PT Comments  PT treated pt two separate sessions this morning. Gfirst session, pt's spouse was not present to observe proper stair performance. Author returned and had pt perform stairs again with different technique with much improved safety and performance. PT is cleared from an acute PT standpoint for safe DC home with HHPT to follow.    If plan is discharge home, recommend the following: A little help with walking and/or transfers;A little help with bathing/dressing/bathroom;Assistance with cooking/housework;Assist for transportation;Help with stairs or ramp for entrance     Equipment Recommendations  Rolling walker (2 wheels);BSC/3in1       Precautions / Restrictions Precautions Precautions: Fall;Anterior Hip Precaution Booklet Issued: Yes (comment) Restrictions Weight Bearing Restrictions: Yes RLE Weight Bearing: Weight bearing as tolerated     Mobility  Bed Mobility Overal bed mobility: Modified Independent Bed Mobility: Supine to Sit     Transfers Overall transfer level: Needs assistance Equipment used: Rolling walker (2 wheels) Transfers: Sit to/from Stand Sit to Stand: Supervision   Ambulation/Gait Ambulation/Gait assistance: Supervision Gait Distance (Feet): 150 Feet Assistive device: Rolling walker (2 wheels) Gait Pattern/deviations: Step-through pattern Gait velocity: decreased  General Gait Details: no LOB. antalgic gait but safe   Stairs Stairs: Yes Stairs assistance: Supervision Stair Management: No rails, Step to pattern, Backwards, With walker, Forwards Number of Stairs: 4 General stair comments: Pt  demonstrated much improved safety with stair performance with use of RW + spouse assist. no difficulty or safety concerns with perform gait on stairs with RW for UE support    Balance Overall balance assessment: Needs assistance Sitting-balance support: No upper extremity supported, Feet supported Sitting balance-Leahy Scale: Good     Standing balance support: Bilateral upper extremity supported, Reliant on assistive device for balance Standing balance-Leahy Scale: Good       Cognition Arousal: Alert Behavior During Therapy: WFL for tasks assessed/performed Overall Cognitive Status: Within Functional Limits for tasks assessed          General Comments General comments (skin integrity, edema, etc.): reviewed importance of ther ex at home and expectations with HH. discussed car transfers and education on ther ex to promote strengthening      Pertinent Vitals/Pain Pain Assessment Pain Assessment: 0-10 Pain Score: 4  Pain Location: R hip Pain Descriptors / Indicators: Sore Pain Intervention(s): Limited activity within patient's tolerance, Monitored during session, Premedicated before session, Repositioned     PT Goals (current goals can now be found in the care plan section) Acute Rehab PT Goals Patient Stated Goal: go home Progress towards PT goals: Progressing toward goals    Frequency    BID       AM-PAC PT "6 Clicks" Mobility   Outcome Measure  Help needed turning from your back to your side while in a flat bed without using bedrails?: None Help needed moving from lying on your back to sitting on the side of a flat bed without using bedrails?: A Little Help needed moving to and from a bed to a chair (including a wheelchair)?: A Little Help needed standing up from a chair using your arms (e.g., wheelchair or bedside chair)?: A Little Help needed to walk in  hospital room?: A Little Help needed climbing 3-5 steps with a railing? : A Little 6 Click Score: 19     End of Session Equipment Utilized During Treatment: Gait belt Activity Tolerance: Patient tolerated treatment well Patient left: in bed;with call bell/phone within reach;with family/visitor present Nurse Communication: Mobility status;Precautions;Weight bearing status;Other (comment) PT Visit Diagnosis: Other abnormalities of gait and mobility (R26.89);Muscle weakness (generalized) (M62.81);Pain Pain - Right/Left: Right Pain - part of body: Hip     Time: 1030-1053 PT Time Calculation (min) (ACUTE ONLY): 23 min  Charges:    $Gait Training: 8-22 mins $Therapeutic Activity: 8-22 mins PT General Charges $$ ACUTE PT VISIT: 1 Visit                     Jetta Lout PTA 07/18/23, 11:44 AM

## 2023-07-18 NOTE — Discharge Instructions (Signed)
Instructions after Anterior Total Hip Replacement        Dr. Serita Butcher., M.D.      Dept. of [facility]  [place], [place]  [address]  Phone: [phone]   Fax: [phone]    DIET: Drink plenty of non-alcoholic fluids. Resume your normal diet. Include foods high in fiber.  ACTIVITY:  You may use crutches or a walker with weight-bearing as tolerated, unless instructed otherwise. You may be weaned off of the walker or crutches by your Physical Therapist.  Continue doing gentle exercises. Exercising will reduce the pain and swelling, increase motion, and prevent muscle weakness.   Please continue to use the TED compression stockings for 2 weeks. You may remove the stockings at night, but should reapply them in the morning. Do not drive or operate any equipment until instructed.  WOUND CARE:  Continue to use ice packs periodically to reduce pain and swelling. You may shower with honeycomb dressing 3 days after your surgery. Do not submerge incision site under water. Remove honeycomb dressing 7 days after surgery and allow dermabond to fall off on its own.   MEDICATIONS: You may resume your regular medications. Please take the pain medication as prescribed on the medication list. Do not take pain medication on an empty stomach. You have been given a prescription for a blood thinner to prevent blood clots. Please take the medication as instructed. (NOTE: After completing a 2 week course of Lovenox, take one Enteric-coated 81 mg aspirin twice a day for 3 additional weeks.) Pain medications and iron supplements can cause constipation. Use a stool softener (Senokot or Colace) on a daily basis and a laxative (dulcolax or miralax) as needed. Do not drive or drink alcoholic beverages when taking pain medications.  POSTOPERATIVE CONSTIPATION PROTOCOL Constipation - defined medically as fewer than three stools per week and  severe constipation as less than one stool per week.  One of the most common issues patients have following surgery is constipation.  Even if you have a regular bowel pattern at home, your normal regimen is likely to be disrupted due to multiple reasons following surgery.  Combination of anesthesia, postoperative narcotics, change in appetite and fluid intake all can affect your bowels.  In order to avoid complications following surgery, here are some recommendations in order to help you during your recovery period.  Colace (docusate) - Pick up an over-the-counter form of Colace or another stool softener and take twice a day as long as you are requiring postoperative pain medications.  Take with a full glass of water daily.  If you experience loose stools or diarrhea, hold the colace until you stool forms back up.  If your symptoms do not get better within 1 week or if they get worse, check with your doctor.  Dulcolax (bisacodyl) - Pick up over-the-counter and take as directed by the product packaging as needed to assist with the movement of your bowels.  Take with a full glass of water.  Use this product as needed if not relieved by Colace only.   MiraLax (polyethylene glycol) - Pick up over-the-counter to have on hand.  MiraLax is a solution that will increase the amount of water in your bowels to assist with bowel movements.  Take as directed and can mix with a glass of water, juice, soda, coffee, or tea.  Take if you go more than two days without a movement. Do not use MiraLax more than  once per day. Call your doctor if you are still constipated or irregular after using this medication for 7 days in a row.  If you continue to have problems with postoperative constipation, please contact the office for further assistance and recommendations.  If you experience "the worst abdominal pain ever" or develop nausea or vomiting, please contact the office immediatly for further recommendations for  treatment.   CALL THE OFFICE FOR: Temperature above 101 degrees Excessive bleeding or drainage on the dressing. Excessive swelling, coldness, or paleness of the toes. Persistent nausea and vomiting.  FOLLOW-UP:  You should have an appointment to return to the office in 2 weeks after surgery. Arrangements have been made for continuation of Physical Therapy (either home therapy or outpatient therapy).

## 2023-07-18 NOTE — Discharge Summary (Addendum)
Physician Discharge Summary  Patient ID: Andre Stone MRN: [id number] DOB/AGE: July 21, 1946 77 y.o.  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(HCC) 11/12/2019   Essential (primary) hypertension 12/28/2015   Gastro-esophageal reflux disease without esophagitis 12/28/2015   Cannot sleep 12/28/2015   Apnea, sleep 12/28/2015   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   Community acquired pneumonia 12/25/2015   Pneumonia 12/25/2015   Borderline diabetes mellitus 01/27/2014    Past Medical History:  Diagnosis Date   Anemia    Aortic atherosclerosis (HCC)    Borderline diabetes mellitus 01/27/2014   Cataract    Community acquired pneumonia 12/25/2015   Coronary artery disease involving native coronary artery of native heart with angina pectoris (HCC)    a.) cCTA 08/29/2022: Ca2+ = 937 (77th %'ile for age/sex/race match control); 50-60% pLAD, 25-49% pRCA, < 25% dLM and LCx --> FFRct : LAD (0.85), LCx (0.97), RCA (0.94)   Diverticulitis large intestine    Ectatic thoracic aorta (HCC) 08/29/2022   a.) cCTA 08/29/2022: asc Ao = 3.9 cm   Erectile dysfunction    a.) on PDE5i (sildenafil) PRN   Essential (primary) hypertension 12/28/2015   Gastro-esophageal reflux disease without esophagitis 12/28/2015   History of kidney stones    Hypertension    Insomnia    a.) takes  trazodone PRN   Long term current use of aspirin    Mixed hyperlipidemia    Myocardial infarction (HCC)    possible small event EKG changes   OSA on CPAP    Osteoarthritis    RBBB (right bundle branch block)    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(HCC)      Transfusion: None   Consultants (if any):   Discharged Condition: Improved  Hospital Course: Andre Stone is an 77 y.o. male who was admitted 07/17/2023 with a diagnosis of S/P total right hip arthroplasty and went to the operating room on 07/17/2023 and underwent the above named procedures.    Surgeries: Procedure(s): TOTAL HIP ARTHROPLASTY ANTERIOR APPROACH on 07/17/2023 Patient tolerated the surgery well. Taken to PACU where she was stabilized and then transferred to the orthopedic floor.  Started on Lovenox 40 mg q 24 hrs. TEDs and SCDs applied bilaterally. Heels elevated on bed. No evidence of DVT. Negative Homan. Physical therapy started on day #1 for gait training and transfer. OT started day #1 for ADL and assisted devices.  Patient's IV was d/c on day #1. Patient was able to safely and independently complete all PT goals. PT recommending discharge to home.    On post op day #1 patient was stable and ready for discharge to home with home health PT.  Implants: Trident Tritanium Clusterhole 52/E w/ x2 screws   Liner: Neutral X3 poly 36/E  Stem: Insignia #5 High Offset  Head:Biolox Ceramic 36mm +12mm    He was given perioperative antibiotics:  Anti-infectives (From admission, onward)    Start     Dose/Rate Route Frequency Ordered Stop  07/17/23 1700  ceFAZolin (ANCEF) IVPB 2g/100 mL premix        2 g 200 mL/hr over 30 Minutes Intravenous Every 6 hours 07/17/23 1526 07/17/23 2244   07/17/23 0600  ceFAZolin (ANCEF) IVPB 2g/100 mL premix        2 g 200 mL/hr over 30 Minutes Intravenous On call to O.R. 07/17/23 1610 07/17/23 1054     .  He was given sequential compression devices, early ambulation, and Lovenox,  teds for DVT prophylaxis.  He benefited maximally from the hospital stay and there were no complications.    Recent vital signs:  Vitals:   07/17/23 1925 07/18/23 0515  BP: (!) 145/96 (!) 142/76  Pulse: 75 84  Resp: 18 16  Temp:  97.7 F (36.5 C)  SpO2: 99% 96%    Recent laboratory studies:  Lab Results  Component Value Date   HGB 11.5 (L) 07/18/2023   HGB 14.9 07/08/2023   HGB 13.5 03/28/2022   Lab Results  Component Value Date   WBC 11.7 (H) 07/18/2023   PLT 152 07/18/2023   No results found for: "INR" Lab Results  Component Value Date   NA 136 07/18/2023   K 3.8 07/18/2023   CL 102 07/18/2023   CO2 25 07/18/2023   BUN 23 07/18/2023   CREATININE 1.12 07/18/2023   GLUCOSE 126 (H) 07/18/2023    Discharge Medications:      Diagnostic Studies: DG HIP UNILAT WITH PELVIS 1V RIGHT  Result Date: 07/17/2023 CLINICAL DATA:  Status post total right hip arthroplasty. Intraoperative fluoroscopy. EXAM: DG HIP (WITH OR WITHOUT PELVIS) 1V RIGHT COMPARISON:  CT abdomen and pelvis 05/14/2023 FINDINGS: Images were performed intraoperatively without the presence of a radiologist. New total right hip arthroplasty. No hardware complication is seen. Total fluoroscopy images: 3 Total fluoroscopy time: 19 seconds Total dose: Radiation Exposure Index (as provided by the fluoroscopic device): 2.59 mGy air Kerma Please see intraoperative findings for further detail. IMPRESSION: Intraoperative fluoroscopy for total right hip arthroplasty. Electronically Signed   By: Neita Garnet M.D.   On: 07/17/2023 15:02   DG C-Arm 1-60 Min-No Report  Result Date: 07/17/2023 Fluoroscopy was utilized by the requesting physician.  No radiographic interpretation.   DG C-Arm 1-60 Min-No Report  Result Date: 07/17/2023 Fluoroscopy was utilized by the requesting physician.  No radiographic interpretation.    Disposition:      Follow-up Information     Evon Slack, PA-C Follow up in 2 week(s).    Specialties: Orthopedic Surgery, Emergency Medicine Contact information: [address] [place] [address] [phone]                  Signed: Patience Musca 07/18/2023, 7:57 AM

## 2023-07-18 NOTE — Progress Notes (Signed)
Patient is not able to walk the distance required to go the bathroom, or he is unable to safely negotiate stairs required to access the bathroom.  A 3in1 BSC will alleviate this problem.        T. Chris Duchess Armendarez, PA-C [facility] 

## 2023-07-18 NOTE — Anesthesia Postprocedure Evaluation (Signed)
Anesthesia Post Note  Patient: Andre Stone  Procedure(s) Performed: TOTAL HIP ARTHROPLASTY ANTERIOR APPROACH (Right: Hip)  Patient location during evaluation: PACU Anesthesia Type: Spinal Level of consciousness: awake and alert Pain management: pain level controlled Vital Signs Assessment: post-procedure vital signs reviewed and stable Respiratory status: spontaneous breathing, nonlabored ventilation, respiratory function stable and patient connected to nasal cannula oxygen Cardiovascular status: blood pressure returned to baseline and stable Postop Assessment: no apparent nausea or vomiting Anesthetic complications: no  No notable events documented.   Last Vitals:  Vitals:   07/18/23 0515 07/18/23 0855  BP: (!) 142/76 128/73  Pulse: 84 82  Resp: 16 15  Temp: 36.5 C 36.9 C  SpO2: 96% 95%    Last Pain:  Vitals:   07/18/23 0855  TempSrc: Temporal  PainSc:                  Stephanie Coup

## 2023-07-18 NOTE — TOC Transition Note (Signed)
Transition of Care [facility]) - CM/SW Discharge Note   Patient Details  Name: Andre Stone MRN: [id number] Date of Birth: 1946-09-06  Transition of Care [facility]) CM/SW Contact:  Margarito Liner, LCSW Phone Number: 07/18/2023, 9:27 AM   Clinical Narrative:   Patient has orders to discharge home today. CSW met with patient. No supports at bedside. CSW introduced role and explained that therapy recommendations would be discussed. Patient was set up with [facility] prior to admission. He is agreeable to this as well as DME orders for RW and 3-in-1. CSW ordered through [employer]. No further concerns. CSW signing off.  Final next level of care: Home w Home Health Services Barriers to Discharge: No Barriers Identified   Patient Goals and CMS Choice   Choice offered to / list presented to : Patient  Discharge Placement                  Patient to be transferred to facility by: Wife   Patient and family notified of of transfer: 07/18/23  Discharge Plan and Services Additional resources added to the After Visit Summary for                  DME Arranged: Walker rolling, 3-N-1 DME Agency: [employer] Date DME Agency Contacted: 07/18/23   Representative spoke with at DME Agency: Yvone Neu HH Arranged: PT, OT [facility] Agency: [facility] Date HH Agency Contacted: 07/18/23   Representative spoke with at [facility] Agency: Duwaine Maxin  Social Determinants of Health (SDOH) Interventions SDOH Screenings   Food Insecurity: No Food Insecurity (07/17/2023)  Housing: Low Risk  (07/17/2023)  Transportation Needs: No Transportation Needs (07/17/2023)  Utilities: Not At Risk (07/17/2023)  Financial Resource Strain: Low Risk  (05/21/2023)   Received from [facility]  Tobacco Use: Low Risk  (07/17/2023)     Readmission Risk Interventions     No data to display

## 2023-07-18 NOTE — Progress Notes (Signed)
   Subjective: 1 Day Post-Op Procedure(s) (LRB): TOTAL HIP ARTHROPLASTY ANTERIOR APPROACH (Right) Patient reports pain as mild.   Patient is well, and has had no acute complaints or problems Denies any CP, SOB, ABD pain. We will continue therapy today.  Plan is to go Home after hospital stay.  Objective: Vital signs in last 24 hours: Temp:  [96.9 F (36.1 C)-97.7 F (36.5 C)] 97.7 F (36.5 C) (10/25 0515) Pulse Rate:  [64-84] 84 (10/25 0515) Resp:  [12-18] 16 (10/25 0515) BP: (103-145)/(66-96) 142/76 (10/25 0515) SpO2:  [94 %-99 %] 96 % (10/25 0515) Weight:  [88.9 kg] 88.9 kg (10/24 0855)  Intake/Output from previous day: 10/24 0701 - 10/25 0700 In: 1830 [P.O.:480; I.V.:750; IV Piggyback:600] Out: 1525 [Urine:1375; Blood:150] Intake/Output this shift: No intake/output data recorded.  Recent Labs    07/18/23 0630  HGB 11.5*   Recent Labs    07/18/23 0630  WBC 11.7*  RBC 3.82*  HCT 33.2*  PLT 152   Recent Labs    07/18/23 0630  NA 136  K 3.8  CL 102  CO2 25  BUN 23  CREATININE 1.12  GLUCOSE 126*  CALCIUM 8.4*   No results for input(s): "LABPT", "INR" in the last 72 hours.  EXAM General - Patient is Alert, Appropriate, and Oriented Extremity - Sensation intact distally Intact pulses distally Dorsiflexion/Plantar flexion intact No cellulitis present Compartment soft Dressing - dressing C/D/I and no drainage Motor Function - intact, moving foot and toes well on exam.   Past Medical History:  Diagnosis Date   Anemia    Aortic atherosclerosis (HCC)    Borderline diabetes mellitus 01/27/2014   Cataract    Community acquired pneumonia 12/25/2015   Coronary artery disease involving native coronary artery of native heart with angina pectoris (HCC)    a.) cCTA 08/29/2022: Ca2+ = 937 (77th %'ile for age/sex/race match control); 50-60% pLAD, 25-49% pRCA, < 25% dLM and LCx --> FFRct : LAD (0.85), LCx (0.97), RCA (0.94)   Diverticulitis large intestine     Ectatic thoracic aorta (HCC) 08/29/2022   a.) cCTA 08/29/2022: asc Ao = 3.9 cm   Erectile dysfunction    a.) on PDE5i (sildenafil) PRN   Essential (primary) hypertension 12/28/2015   Gastro-esophageal reflux disease without esophagitis 12/28/2015   History of kidney stones    Hypertension    Insomnia    a.) takes trazodone PRN   Long term current use of aspirin    Mixed hyperlipidemia    Myocardial infarction (HCC)    possible small event EKG changes   OSA on CPAP    Osteoarthritis    RBBB (right bundle branch block)    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(HCC)     Assessment/Plan:   1 Day Post-Op Procedure(s) (LRB): TOTAL HIP ARTHROPLASTY ANTERIOR APPROACH (Right) Principal Problem:   S/P total right hip arthroplasty  Estimated body mass index is 26.58 kg/m as calculated from the following:   Height as of this encounter: 6' (1.829 m).   Weight as of this encounter: 88.9 kg. Advance diet Up with therapy Pain well-controlled Labs and vital signs are stable Care management to assist with discharge to home with home health PT today.  DVT Prophylaxis - Lovenox, TED hose, and SCDs Weight-Bearing as tolerated to right leg   T. Cranston Neighbor, PA-C [facility] 07/18/2023, 7:52 AM

## 2023-07-21 LAB — SURGICAL PATHOLOGY

## 2023-07-28 ENCOUNTER — Encounter: Payer: Self-pay | Admitting: Urology

## 2023-07-30 ENCOUNTER — Encounter: Payer: Self-pay | Admitting: Urology

## 2023-07-30 ENCOUNTER — Ambulatory Visit: Payer: Medicare PPO | Admitting: Urology

## 2023-07-30 VITALS — BP 116/75 | HR 80 | Ht 72.0 in | Wt 195.0 lb

## 2023-07-30 DIAGNOSIS — Z08 Encounter for follow-up examination after completed treatment for malignant neoplasm: Secondary | ICD-10-CM

## 2023-07-30 DIAGNOSIS — Z8551 Personal history of malignant neoplasm of bladder: Secondary | ICD-10-CM | POA: Diagnosis not present

## 2023-07-30 LAB — URINALYSIS, COMPLETE
Bilirubin, UA: NEGATIVE
Glucose, UA: NEGATIVE
Ketones, UA: NEGATIVE
Leukocytes,UA: NEGATIVE
Nitrite, UA: NEGATIVE
Protein,UA: NEGATIVE
RBC, UA: NEGATIVE
Specific Gravity, UA: 1.015 (ref 1.005–1.030)
Urobilinogen, Ur: 0.2 mg/dL (ref 0.2–1.0)
pH, UA: 5.5 (ref 5.0–7.5)

## 2023-07-30 LAB — MICROSCOPIC EXAMINATION: Bacteria, UA: NONE SEEN

## 2023-07-30 MED ORDER — SULFAMETHOXAZOLE-TRIMETHOPRIM 800-160 MG PO TABS
1.0000 | ORAL_TABLET | Freq: Once | ORAL | Status: AC
  Administered 2023-07-30: 1 via ORAL

## 2023-07-30 NOTE — Progress Notes (Signed)
   07/30/23  CC:  Chief Complaint  Patient presents with   Cysto    Urologic history: 1.  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  Uric acid nephrolithiasis Hypocitraturia, mild hyperuricemia and hyperuricosuria.  Urine pH 5.9 On potassium citrate Ureteroscopic stone removal 12/2015, 10/2019, 12/2021 Stone analysis 100% uric acid   HPI: 77 y.o. male presents for surveillance cystoscopy. He has no complaints.  Underwent recent right hip replacement UA today negative microscopy.  Follow-up ultrasound was showing persistent mild left hydronephrosis.  A CT scan was obtained which did not show hydronephrosis.  Blood pressure 129/77, pulse 75, height 6' (1.829 m), weight 197 lb (89.4 kg). NED. A&Ox3.   No respiratory distress   Abd soft, NT, ND Normal phallus with bilateral descended testicles  Cystoscopy Procedure Note  Patient identification was confirmed, informed consent was obtained, and patient was prepped using Betadine solution.  Lidocaine jelly was administered per urethral meatus.     Pre-Procedure: - Inspection reveals a normal caliber urethral meatus.  Procedure: The flexible cystoscope was introduced without difficulty - No urethral strictures/lesions are present. - Moderate lateral lobe enlargement prostate; moderate median lobe - Normal bladder neck - Bilateral ureteral orifices identified - Bladder mucosa  reveals no ulcers, tumors, or lesions - No bladder stones - Mild trabeculation  Retroflexion  shows no intravesical median lobe or tumor   Post-Procedure: - Patient tolerated the procedure well  Assessment/ Plan: No evidence recurrent urothelial carcinoma Follow-up surveillance cystoscopy February 2025 3 week maintenance BCG late November 2024 Septra DS given post cystoscopy with recent hip replacement    Riki Altes, MD

## 2023-08-06 ENCOUNTER — Encounter: Payer: Self-pay | Admitting: Ophthalmology

## 2023-08-12 NOTE — Anesthesia Preprocedure Evaluation (Signed)
Anesthesia Evaluation  Patient identified by MRN, date of birth, ID band Patient awake    Reviewed: Allergy & Precautions, H&P , NPO status , Patient's Chart, lab work & pertinent test results  Airway Mallampati: III  TM Distance: >3 FB Neck ROM: Full    Dental no notable dental hx.    Pulmonary sleep apnea , pneumonia   Pulmonary exam normal breath sounds clear to auscultation       Cardiovascular hypertension, + angina  + CAD and + Past MI  Normal cardiovascular exam+ dysrhythmias  Rhythm:Regular Rate:Normal  ECG 01/07/21:  Sinus rhythm IVCD, consider atypical RBBB   Myocardial perfusion 10/03/17:  LVEF= 62%  Regional wall motion:  reveals normal myocardial thickening and wall motion.  The overall quality of the study is excellent.    Artifacts noted: no  Left ventricular cavity: normal.  Perfusion Analysis:  SPECT images demonstrate homogeneous tracer distribution throughout the myocardium.        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)  Anemia Cataract  Myocardial infarction (HCC) OSA on CPAP  Diverticulitis large intestine Ectatic thoracic aora Insomnia Aortic atherosclerosis  Osteoarthritis RBBB (right bundle branch block)       Reproductive/Obstetrics negative OB ROS                              Anesthesia Physical Anesthesia Plan  ASA: 3  Anesthesia Plan: MAC   Post-op Pain Management:    Induction: Intravenous  PONV Risk Score and Plan:   Airway Management Planned: Natural Airway and Nasal Cannula  Additional Equipment:   Intra-op Plan:   Post-operative Plan:   Informed Consent: I have reviewed the patients History and Physical, chart, labs and discussed the procedure including the risks, benefits and alternatives for the proposed anesthesia with the patient or authorized representative who has indicated his/her understanding and acceptance.     Dental Advisory Given  Plan Discussed with: Anesthesiologist, CRNA and Surgeon  Anesthesia Plan Comments: (Patient consented for risks of anesthesia including but not limited to:  - adverse reactions to medications - damage to eyes, teeth, lips or other oral mucosa - nerve damage due to positioning  - sore throat or hoarseness - Damage to heart, brain, nerves, lungs, other parts of body or loss of life  Patient voiced understanding and assent.)         Anesthesia Quick Evaluation

## 2023-08-12 NOTE — Discharge Instructions (Signed)

## 2023-08-13 ENCOUNTER — Ambulatory Visit: Payer: Medicare PPO | Admitting: Anesthesiology

## 2023-08-13 ENCOUNTER — Encounter: Payer: Self-pay | Admitting: Ophthalmology

## 2023-08-13 ENCOUNTER — Ambulatory Visit
Admission: RE | Admit: 2023-08-13 | Discharge: 2023-08-13 | Disposition: A | Discharge status: Home / Self Care | Payer: Medicare PPO | Attending: Ophthalmology | Admitting: Ophthalmology

## 2023-08-13 ENCOUNTER — Encounter
Admission: RE | Disposition: A | Discharge status: Home / Self Care | Payer: Self-pay | Source: Home / Self Care | Attending: Ophthalmology

## 2023-08-13 ENCOUNTER — Other Ambulatory Visit: Payer: Self-pay

## 2023-08-13 ENCOUNTER — Ambulatory Visit: Payer: Self-pay | Admitting: Anesthesiology

## 2023-08-13 DIAGNOSIS — G4733 Obstructive sleep apnea (adult) (pediatric): Secondary | ICD-10-CM | POA: Diagnosis not present

## 2023-08-13 DIAGNOSIS — I700 Atherosclerosis of aorta: Secondary | ICD-10-CM | POA: Diagnosis not present

## 2023-08-13 DIAGNOSIS — Z8551 Personal history of malignant neoplasm of bladder: Secondary | ICD-10-CM | POA: Diagnosis not present

## 2023-08-13 DIAGNOSIS — I2510 Atherosclerotic heart disease of native coronary artery without angina pectoris: Secondary | ICD-10-CM | POA: Diagnosis not present

## 2023-08-13 DIAGNOSIS — K219 Gastro-esophageal reflux disease without esophagitis: Secondary | ICD-10-CM | POA: Insufficient documentation

## 2023-08-13 DIAGNOSIS — I10 Essential (primary) hypertension: Secondary | ICD-10-CM | POA: Diagnosis not present

## 2023-08-13 DIAGNOSIS — H2512 Age-related nuclear cataract, left eye: Secondary | ICD-10-CM | POA: Diagnosis present

## 2023-08-13 DIAGNOSIS — I4510 Unspecified right bundle-branch block: Secondary | ICD-10-CM | POA: Insufficient documentation

## 2023-08-13 DIAGNOSIS — I252 Old myocardial infarction: Secondary | ICD-10-CM | POA: Insufficient documentation

## 2023-08-13 HISTORY — PX: CATARACT EXTRACTION W/PHACO: SHX586

## 2023-08-13 SURGERY — PHACOEMULSIFICATION, CATARACT, WITH IOL INSERTION
Anesthesia: Monitor Anesthesia Care | Laterality: Left

## 2023-08-13 MED ORDER — TETRACAINE HCL 0.5 % OP SOLN
1.0000 [drp] | OPHTHALMIC | Status: DC | PRN
  Administered 2023-08-13 (×3): 1 [drp] via OPHTHALMIC

## 2023-08-13 MED ORDER — SIGHTPATH DOSE#1 BSS IO SOLN
INTRAOCULAR | Status: DC | PRN
  Administered 2023-08-13: 62 mL via OPHTHALMIC

## 2023-08-13 MED ORDER — SIGHTPATH DOSE#1 BSS IO SOLN
INTRAOCULAR | Status: DC | PRN
  Administered 2023-08-13: 2 mL

## 2023-08-13 MED ORDER — ARMC OPHTHALMIC DILATING DROPS
1.0000 | OPHTHALMIC | Status: DC | PRN
  Administered 2023-08-13 (×3): 1 via OPHTHALMIC

## 2023-08-13 MED ORDER — FENTANYL CITRATE (PF) 100 MCG/2ML IJ SOLN
INTRAMUSCULAR | Status: AC
  Filled 2023-08-13: qty 2

## 2023-08-13 MED ORDER — SIGHTPATH DOSE#1 NA HYALUR & NA CHOND-NA HYALUR IO KIT
PACK | INTRAOCULAR | Status: DC | PRN
  Administered 2023-08-13: 1 via OPHTHALMIC

## 2023-08-13 MED ORDER — TETRACAINE HCL 0.5 % OP SOLN
OPHTHALMIC | Status: AC
  Filled 2023-08-13: qty 4

## 2023-08-13 MED ORDER — MOXIFLOXACIN HCL 0.5 % OP SOLN
OPHTHALMIC | Status: DC | PRN
  Administered 2023-08-13: .2 mL via OPHTHALMIC

## 2023-08-13 MED ORDER — MIDAZOLAM HCL 2 MG/2ML IJ SOLN
INTRAMUSCULAR | Status: DC | PRN
  Administered 2023-08-13: 2 mg via INTRAVENOUS

## 2023-08-13 MED ORDER — SIGHTPATH DOSE#1 BSS IO SOLN
INTRAOCULAR | Status: DC | PRN
  Administered 2023-08-13: 15 mL via INTRAOCULAR

## 2023-08-13 MED ORDER — ARMC OPHTHALMIC DILATING DROPS
OPHTHALMIC | Status: AC
  Filled 2023-08-13: qty 0.5

## 2023-08-13 MED ORDER — FENTANYL CITRATE (PF) 100 MCG/2ML IJ SOLN
INTRAMUSCULAR | Status: DC | PRN
  Administered 2023-08-13: 50 ug via INTRAVENOUS

## 2023-08-13 MED ORDER — MIDAZOLAM HCL 2 MG/2ML IJ SOLN
INTRAMUSCULAR | Status: AC
  Filled 2023-08-13: qty 2

## 2023-08-13 MED ORDER — BRIMONIDINE TARTRATE-TIMOLOL 0.2-0.5 % OP SOLN
OPHTHALMIC | Status: DC | PRN
  Administered 2023-08-13: 1 [drp] via OPHTHALMIC

## 2023-08-13 SURGICAL SUPPLY — 9 items
CATARACT SUITE SIGHTPATH (MISCELLANEOUS) ×1
FEE CATARACT SUITE SIGHTPATH (MISCELLANEOUS) ×1 IMPLANT
GLOVE SRG 8 PF TXTR STRL LF DI (GLOVE) ×1 IMPLANT
GLOVE SURG ENC TEXT LTX SZ7.5 (GLOVE) ×1 IMPLANT
LENS CLAREON VIVITY 20 ×1 IMPLANT
LENS IOL CLRN VT YLW 20.0 IMPLANT
NDL FILTER BLUNT 18X1 1/2 (NEEDLE) ×1 IMPLANT
NEEDLE FILTER BLUNT 18X1 1/2 (NEEDLE) ×1
SYR 3ML LL SCALE MARK (SYRINGE) ×1 IMPLANT

## 2023-08-13 NOTE — Anesthesia Postprocedure Evaluation (Signed)
Anesthesia Post Note  Patient: Andre Stone  Procedure(s) Performed: CATARACT EXTRACTION PHACO AND INTRAOCULAR LENS PLACEMENT (IOC) LEFT CLAREON VIVITY 4.78 00:36.5 (Left)  Patient location during evaluation: PACU Anesthesia Type: MAC Level of consciousness: awake and alert Pain management: pain level controlled Vital Signs Assessment: post-procedure vital signs reviewed and stable Respiratory status: spontaneous breathing, nonlabored ventilation, respiratory function stable and patient connected to nasal cannula oxygen Cardiovascular status: stable and blood pressure returned to baseline Postop Assessment: no apparent nausea or vomiting Anesthetic complications: no   No notable events documented.   Last Vitals:  Vitals:   08/13/23 1247 08/13/23 1256  BP: 112/87 (!) 125/97  Pulse: 67 68  Resp: 20 12  Temp: 36.5 C 36.6 C  SpO2: 97% 97%    Last Pain:  Vitals:   08/13/23 1256  TempSrc:   PainSc: 0-No pain                 Annis Lagoy C Deitrich Steve

## 2023-08-13 NOTE — Op Note (Signed)
OPERATIVE NOTE  Andre Stone [id number] 08/13/2023   PREOPERATIVE DIAGNOSIS:  Nuclear sclerotic cataract left eye. H25.12   POSTOPERATIVE DIAGNOSIS:    Nuclear sclerotic cataract left eye.     PROCEDURE:  Phacoemusification with posterior chamber intraocular lens placement of the left eye  Ultrasound time: Procedure(s): CATARACT EXTRACTION PHACO AND INTRAOCULAR LENS PLACEMENT (IOC) LEFT CLAREON VIVITY 4.78 00:36.5 (Left)  LENS:   Implant Name Type Inv. Item Serial No. Manufacturer Lot No. LRB No. Used Action  LENS CLAREON VIVITY 20 - [id number]  LENS CLAREON VIVITY 20 [id number] [employer]  Left 1 Implanted    [id number] 20.0  SURGEON:  Deirdre Evener, MD   ANESTHESIA:  Topical with tetracaine drops and 2% Xylocaine jelly, augmented with 1% preservative-free intracameral lidocaine.    COMPLICATIONS:  None.   DESCRIPTION OF PROCEDURE:  The patient was identified in the holding room and transported to the operating room and placed in the supine position under the operating microscope.  The left eye was identified as the operative eye and it was prepped and draped in the usual sterile ophthalmic fashion.   A 1 millimeter clear-corneal paracentesis was made at the 1:30 position.  0.5 ml of preservative-free 1% lidocaine was injected into the anterior chamber.  The anterior chamber was filled with Viscoat viscoelastic.  A 2.4 millimeter keratome was used to make a near-clear corneal incision at the 10:30 position.  .  A curvilinear capsulorrhexis was made with a cystotome and capsulorrhexis forceps.  Balanced salt solution was used to hydrodissect and hydrodelineate the nucleus.   Phacoemulsification was then used in stop and chop fashion to remove the lens nucleus and epinucleus.  The remaining cortex was then removed using the irrigation and aspiration handpiece. Provisc was then placed into the capsular bag to distend it for lens placement.  A lens was then injected into  the capsular bag.  The remaining viscoelastic was aspirated.   Wounds were hydrated with balanced salt solution.  The anterior chamber was inflated to a physiologic pressure with balanced salt solution.  No wound leaks were noted. Vigamox 0.2 ml of a 1mg  per ml solution was injected into the anterior chamber for a dose of 0.2 mg of intracameral antibiotic at the completion of the case.   Timolol and Brimonidine drops were applied to the eye.  The patient was taken to the recovery room in stable condition without complications of anesthesia or surgery.  [name] 08/13/2023, 12:46 PM

## 2023-08-13 NOTE — H&P (Signed)
[facility]   Primary Care Physician:  Marisue Ivan, MD Ophthalmologist: Dr. Lockie Mola  Pre-Procedure History & Physical: HPI:  Andre Stone is a 77 y.o. male here for ophthalmic surgery.   Past Medical History:  Diagnosis Date   Anemia    Aortic atherosclerosis (HCC)    Borderline diabetes mellitus 01/27/2014   Cataract    Community acquired pneumonia 12/25/2015   Coronary artery disease involving native coronary artery of native heart with angina pectoris (HCC)    a.) cCTA 08/29/2022: Ca2+ = 937 (77th %'ile for age/sex/race match control); 50-60% pLAD, 25-49% pRCA, < 25% dLM and LCx --> FFRct : LAD (0.85), LCx (0.97), RCA (0.94)   Diverticulitis large intestine    Ectatic thoracic aorta (HCC) 08/29/2022   a.) cCTA 08/29/2022: asc Ao = 3.9 cm   Erectile dysfunction    a.) on PDE5i (sildenafil) PRN   Essential (primary) hypertension 12/28/2015   Gastro-esophageal reflux disease without esophagitis 12/28/2015   History of kidney stones    Hypertension    Insomnia    a.) takes trazodone PRN   Long term current use of aspirin    Mixed hyperlipidemia    Myocardial infarction [facility])    possible small event EKG changes   OSA on CPAP    Osteoarthritis    RBBB (right bundle branch block)    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[facility] [place])     Past Surgical History:  Procedure Laterality Date   CHOLECYSTECTOMY     COLONOSCOPY     2003, 2005, 2008   COLONOSCOPY  10/23/2012   COLONOSCOPY  01/01/2018   CYSTOSCOPY W/ RETROGRADES Right 11/02/2019   Procedure: CYSTOSCOPY WITH RETROGRADE PYELOGRAM;  Surgeon: Riki Altes, MD;  Location: [facility] ORS;  Service: Urology;  Laterality: Right;   CYSTOSCOPY W/ RETROGRADES N/A 01/08/2022   Procedure: CYSTOSCOPY WITH RETROGRADE PYELOGRAM/ BLADDER BIOPSY;  Surgeon: Riki Altes, MD;  Location: [facility] OR[facility];  Service: Urology;  Laterality: N/A;   CYSTOSCOPY WITH BIOPSY N/A 11/02/2019   Procedure: CYSTOSCOPY WITH BIOPSY;   Surgeon: Riki Altes, MD;  Location: [facility] OR[facility];  Service: Urology;  Laterality: N/A;   CYSTOSCOPY/RETROGRADE/URETEROSCOPY/STONE EXTRACTION WITH BASKET Right 01/08/2022   Procedure: CYSTOSCOPY/RETROGRADE/URETEROSCOPY/STONE EXTRACTION WITH BASKET;  Surgeon: Riki Altes, MD;  Location: [facility] OR[facility];  Service: Urology;  Laterality: Right;   CYSTOSCOPY/URETEROSCOPY/HOLMIUM LASER/STENT PLACEMENT Right 12/29/2015   Procedure: CYSTOSCOPY/URETEROSCOPY/ with basketing of stone, right ureteral /STENT PLACEMENT;  Surgeon: Sebastian Ache, MD;  Location: [facility] OR[facility];  Service: Urology;  Laterality: Right;   CYSTOSCOPY/URETEROSCOPY/HOLMIUM LASER/STENT PLACEMENT Right 11/02/2019   Procedure: CYSTOSCOPY/URETEROSCOPY/HOLMIUM LASER/STENT PLACEMENT;  Surgeon: Riki Altes, MD;  Location: [facility];  Service: Urology;  Laterality: Right;   CYSTOSCOPY/URETEROSCOPY/HOLMIUM LASER/STENT PLACEMENT Right 01/08/2022   Procedure: CYSTOSCOPY/URETEROSCOPY/HOLMIUM LASER/STENT PLACEMENT;  Surgeon: Riki Altes, MD;  Location: [facility];  Service: Urology;  Laterality: Right;   ERCP  06/15/2003   ESOPHAGOGASTRODUODENOSCOPY     2003, 2004   TONSILLECTOMY  1957   TOTAL HIP ARTHROPLASTY Right 07/17/2023   Procedure: TOTAL HIP ARTHROPLASTY ANTERIOR APPROACH;  Surgeon: Reinaldo Berber, MD;  Location: [facility];  Service: Orthopedics;  Laterality: Right;   UPPER ESOPHAGEAL ENDOSCOPIC ULTRASOUND (EUS)  07/06/2003    Prior to Admission medications   Medication Sig Start Date End Date Taking? Authorizing Provider  aspirin EC 81 MG tablet Take 81 mg by mouth daily.   Yes [provider]  atorvastatin (LIPITOR) 80 MG tablet Take 80 mg by mouth at bedtime. 06/11/22  Yes [provider]  celecoxib (CELEBREX) 100 MG capsule Take 100 mg by mouth daily.   Yes [provider]  docusate sodium (COLACE) 100 MG capsule Take 1 capsule (100 mg total) by mouth 2 (two) times daily. 07/18/23  Yes Evon Slack,  PA-C  hydrochlorothiazide (HYDRODIURIL) 25 MG tablet Take 25 mg by mouth daily.   Yes [provider]  lisinopril (PRINIVIL,ZESTRIL) 40 MG tablet Take 40 mg by mouth daily.   Yes [provider]  Multiple Vitamins-Minerals (MULTIVITAMIN WITH MINERALS) tablet Take 1 tablet by mouth daily.   Yes [provider]  omeprazole (PRILOSEC) 20 MG capsule Take 20 mg by mouth daily.   Yes [provider]  Cholecalciferol 25 MCG (1000 UT) capsule Take 1,000 Units by mouth daily. Patient not taking: Reported on 08/06/2023    [provider]  enoxaparin (LOVENOX) 40 MG/0.4ML injection Inject 0.4 mLs (40 mg total) into the skin daily for 14 days. 07/18/23 08/01/23  Evon Slack, PA-C  HYDROcodone-acetaminophen (NORCO/VICODIN) 5-325 MG tablet Take 1-2 tablets by mouth every 4 (four) hours as needed for severe pain (pain score 7-10) (pain score 4-6). Patient not taking: Reported on 08/13/2023 07/18/23   Evon Slack, PA-C  Omega-3 Fatty Acids (FISH OIL) 1000 MG CAPS Take 1,000 mg by mouth daily. Patient not taking: Reported on 08/06/2023    [provider]  sildenafil (REVATIO) 20 MG tablet Take 20 mg by mouth daily as needed (ED). Patient not taking: Reported on 08/06/2023    [provider]  traMADol (ULTRAM) 50 MG tablet Take 1 tablet (50 mg total) by mouth every 6 (six) hours as needed for moderate pain (pain score 4-6). Patient not taking: Reported on 08/06/2023 07/18/23   Evon Slack, PA-C  traZODone (DESYREL) 150 MG tablet Take 75 mg by mouth at bedtime. Patient not taking: Reported on 08/06/2023    [provider]    Allergies as of 07/28/2023 - Review Complete 07/17/2023  Allergen Reaction Noted   Glucosamine-chondroitin Swelling 02/18/2018   Penicillins Rash and Other (See Comments) 12/25/2015    Family History  Problem Relation Age of Onset   Stroke Mother    AAA (abdominal aortic aneurysm) Father     Hypertension Father    Bladder Cancer Neg Hx    Kidney cancer Neg Hx    Prostate cancer Neg Hx     Social History   Socioeconomic History   Marital status: Married    Spouse name: Gaynelle   Number of children: Not on file   Years of education: Not on file   Highest education level: Not on file  Occupational History   Occupation: works as Education administrator in [employer]  Tobacco Use   Smoking status: Never    Passive exposure: Never   Smokeless tobacco: Never  Vaping Use   Vaping status: Never Used  Substance and Sexual Activity   Alcohol use: Yes    Alcohol/week: 1.0 standard drink of alcohol    Types: 1 Shots of liquor per week    Comment: regular, several times per week, liquor   Drug use: No   Sexual activity: Yes  Other Topics Concern   Not on file  Social History Narrative   Two daughters.  Lives with wife.  Retired superior court judge.    Social Determinants of Health   Financial Resource Strain: Low Risk  (05/21/2023)   Received from [facility]   Overall Financial Resource Strain (CARDIA)  Difficulty of Paying Living Expenses: Not hard at all  Food Insecurity: No Food Insecurity (07/17/2023)   Hunger Vital Sign    Worried About Running Out of Food in the Last Year: Never true    Ran Out of Food in the Last Year: Never true  Transportation Needs: No Transportation Needs (07/17/2023)   PRAPARE - Administrator, Civil Service (Medical): No    Lack of Transportation (Non-Medical): No  Physical Activity: Not on file  Stress: Not on file  Social Connections: Not on file  Intimate Partner Violence: Not At Risk (07/17/2023)   Humiliation, Afraid, Rape, and Kick questionnaire    Fear of Current or Ex-Partner: No    Emotionally Abused: No    Physically Abused: No    Sexually Abused: No    Review of Systems: See HPI, otherwise negative ROS  Physical Exam: BP 126/87   Temp (!) 97.3 F (36.3 C) (Temporal)   Ht 6' 0.01" (1.829 m)    Wt 87.8 kg   SpO2 99%   BMI 26.24 kg/m  General:   Alert,  pleasant and cooperative in NAD Head:  Normocephalic and atraumatic. Lungs:  Clear to auscultation.    Heart:  Regular rate and rhythm.   Impression/Plan: Andre Stone is here for ophthalmic surgery.  Risks, benefits, limitations, and alternatives regarding ophthalmic surgery have been reviewed with the patient.  Questions have been answered.  All parties agreeable.   Lockie Mola, MD  08/13/2023, 11:39 AM

## 2023-08-13 NOTE — Transfer of Care (Signed)
Immediate Anesthesia Transfer of Care Note  Patient: Andre Stone  Procedure(s) Performed: CATARACT EXTRACTION PHACO AND INTRAOCULAR LENS PLACEMENT (IOC) LEFT CLAREON VIVITY 4.78 00:36.5 (Left)  Patient Location: PACU  Anesthesia Type: MAC  Level of Consciousness: awake, alert  and patient cooperative  Airway and Oxygen Therapy: Patient Spontanous Breathing and Patient connected to supplemental oxygen  Post-op Assessment: Post-op Vital signs reviewed, Patient's Cardiovascular Status Stable, Respiratory Function Stable, Patent Airway and No signs of Nausea or vomiting  Post-op Vital Signs: Reviewed and stable  Complications: No notable events documented.

## 2023-08-14 ENCOUNTER — Ambulatory Visit: Payer: Medicare PPO

## 2023-08-14 ENCOUNTER — Encounter: Payer: Self-pay | Admitting: Ophthalmology

## 2023-08-14 DIAGNOSIS — Z23 Encounter for immunization: Secondary | ICD-10-CM | POA: Diagnosis not present

## 2023-08-14 DIAGNOSIS — Z719 Counseling, unspecified: Secondary | ICD-10-CM

## 2023-08-14 NOTE — Progress Notes (Signed)
In nurse clinic for immunizations. Patient requesting Covid Comirnaty 12+ and Tdap vaccines. Voices no concerns. VIS reviewed and given to patient. Vaccines tolerated well; no issues noted. [facility] updated and copy given to patient.  Abagail Kitchens, RN

## 2023-08-25 NOTE — Discharge Instructions (Signed)

## 2023-08-26 ENCOUNTER — Encounter: Payer: Self-pay | Admitting: Physician Assistant

## 2023-08-26 ENCOUNTER — Ambulatory Visit: Payer: Medicare PPO | Admitting: Physician Assistant

## 2023-08-26 VITALS — BP 122/76 | HR 80

## 2023-08-26 DIAGNOSIS — C679 Malignant neoplasm of bladder, unspecified: Secondary | ICD-10-CM

## 2023-08-26 LAB — MICROSCOPIC EXAMINATION

## 2023-08-26 LAB — URINALYSIS, COMPLETE
Bilirubin, UA: NEGATIVE
Glucose, UA: NEGATIVE
Ketones, UA: NEGATIVE
Leukocytes,UA: NEGATIVE
Nitrite, UA: NEGATIVE
Protein,UA: NEGATIVE
RBC, UA: NEGATIVE
Specific Gravity, UA: 1.015 (ref 1.005–1.030)
Urobilinogen, Ur: 0.2 mg/dL (ref 0.2–1.0)
pH, UA: 6 (ref 5.0–7.5)

## 2023-08-26 MED ORDER — BCG LIVE 50 MG IS SUSR
3.2400 mL | Freq: Once | INTRAVESICAL | Status: AC
  Administered 2023-08-26: 81 mg via INTRAVESICAL

## 2023-08-26 NOTE — Progress Notes (Signed)
BCG Bladder Instillation  BCG # 1 of 3  Due to Bladder Cancer patient is present today for a BCG treatment. Patient was cleaned and prepped in a sterile fashion with betadine. A 14FR catheter was inserted, urine return was noted 25ml, urine was yellow in color.  50ml of reconstituted BCG was instilled into the bladder. The catheter was then removed. Patient tolerated well, no complications were noted  Performed by: Carman Ching, PA-C and Humberta Magallon-Mariche, CMA  Follow up: 1 week for BCG #2

## 2023-08-27 ENCOUNTER — Encounter
Admission: RE | Disposition: A | Discharge status: Home / Self Care | Payer: Self-pay | Source: Home / Self Care | Attending: Ophthalmology

## 2023-08-27 ENCOUNTER — Ambulatory Visit: Payer: Self-pay | Admitting: Anesthesiology

## 2023-08-27 ENCOUNTER — Other Ambulatory Visit: Payer: Self-pay

## 2023-08-27 ENCOUNTER — Ambulatory Visit
Admission: RE | Admit: 2023-08-27 | Discharge: 2023-08-27 | Disposition: A | Discharge status: Home / Self Care | Payer: Medicare PPO | Attending: Ophthalmology | Admitting: Ophthalmology

## 2023-08-27 ENCOUNTER — Encounter: Payer: Self-pay | Admitting: Ophthalmology

## 2023-08-27 DIAGNOSIS — Z8249 Family history of ischemic heart disease and other diseases of the circulatory system: Secondary | ICD-10-CM | POA: Diagnosis not present

## 2023-08-27 DIAGNOSIS — I10 Essential (primary) hypertension: Secondary | ICD-10-CM | POA: Diagnosis not present

## 2023-08-27 DIAGNOSIS — R7303 Prediabetes: Secondary | ICD-10-CM

## 2023-08-27 DIAGNOSIS — H2511 Age-related nuclear cataract, right eye: Secondary | ICD-10-CM | POA: Diagnosis present

## 2023-08-27 DIAGNOSIS — I252 Old myocardial infarction: Secondary | ICD-10-CM | POA: Insufficient documentation

## 2023-08-27 DIAGNOSIS — E1136 Type 2 diabetes mellitus with diabetic cataract: Secondary | ICD-10-CM | POA: Diagnosis not present

## 2023-08-27 DIAGNOSIS — I2510 Atherosclerotic heart disease of native coronary artery without angina pectoris: Secondary | ICD-10-CM | POA: Diagnosis not present

## 2023-08-27 DIAGNOSIS — G4733 Obstructive sleep apnea (adult) (pediatric): Secondary | ICD-10-CM | POA: Insufficient documentation

## 2023-08-27 HISTORY — PX: CATARACT EXTRACTION W/PHACO: SHX586

## 2023-08-27 SURGERY — PHACOEMULSIFICATION, CATARACT, WITH IOL INSERTION
Anesthesia: Monitor Anesthesia Care | Laterality: Right

## 2023-08-27 MED ORDER — SODIUM CHLORIDE 0.9% FLUSH: 10.0000 mL | Freq: Two times a day (BID) | INTRAVENOUS | Status: DC

## 2023-08-27 MED ORDER — MOXIFLOXACIN HCL 0.5 % OP SOLN
OPHTHALMIC | Status: DC | PRN
  Administered 2023-08-27: .2 mL via OPHTHALMIC

## 2023-08-27 MED ORDER — BRIMONIDINE TARTRATE-TIMOLOL 0.2-0.5 % OP SOLN
OPHTHALMIC | Status: DC | PRN
  Administered 2023-08-27: 1 [drp] via OPHTHALMIC

## 2023-08-27 MED ORDER — SIGHTPATH DOSE#1 BSS IO SOLN
INTRAOCULAR | Status: DC | PRN
  Administered 2023-08-27: 64 mL via OPHTHALMIC

## 2023-08-27 MED ORDER — MIDAZOLAM HCL 2 MG/2ML IJ SOLN
INTRAMUSCULAR | Status: DC | PRN
  Administered 2023-08-27: 2 mg via INTRAVENOUS

## 2023-08-27 MED ORDER — TETRACAINE HCL 0.5 % OP SOLN
OPHTHALMIC | Status: AC
  Filled 2023-08-27: qty 4

## 2023-08-27 MED ORDER — FENTANYL CITRATE (PF) 100 MCG/2ML IJ SOLN
INTRAMUSCULAR | Status: AC
  Filled 2023-08-27: qty 2

## 2023-08-27 MED ORDER — TETRACAINE HCL 0.5 % OP SOLN
1.0000 [drp] | OPHTHALMIC | Status: DC | PRN
  Administered 2023-08-27 (×3): 1 [drp] via OPHTHALMIC

## 2023-08-27 MED ORDER — SIGHTPATH DOSE#1 BSS IO SOLN
INTRAOCULAR | Status: DC | PRN
  Administered 2023-08-27: 15 mL via INTRAOCULAR

## 2023-08-27 MED ORDER — MIDAZOLAM HCL 2 MG/2ML IJ SOLN
INTRAMUSCULAR | Status: AC
  Filled 2023-08-27: qty 2

## 2023-08-27 MED ORDER — ARMC OPHTHALMIC DILATING DROPS
1.0000 | OPHTHALMIC | Status: DC | PRN
  Administered 2023-08-27 (×3): 1 via OPHTHALMIC

## 2023-08-27 MED ORDER — SIGHTPATH DOSE#1 BSS IO SOLN
INTRAOCULAR | Status: DC | PRN
  Administered 2023-08-27: 2 mL

## 2023-08-27 MED ORDER — SIGHTPATH DOSE#1 NA HYALUR & NA CHOND-NA HYALUR IO KIT
PACK | INTRAOCULAR | Status: DC | PRN
  Administered 2023-08-27: 1

## 2023-08-27 MED ORDER — FENTANYL CITRATE (PF) 100 MCG/2ML IJ SOLN
INTRAMUSCULAR | Status: DC | PRN
  Administered 2023-08-27 (×2): 50 ug via INTRAVENOUS

## 2023-08-27 SURGICAL SUPPLY — 10 items
CATARACT SUITE SIGHTPATH (MISCELLANEOUS) ×1
FEE CATARACT SUITE SIGHTPATH (MISCELLANEOUS) ×1 IMPLANT
GLOVE SRG 8 PF TXTR STRL LF DI (GLOVE) ×1 IMPLANT
GLOVE SURG ENC TEXT LTX SZ7.5 (GLOVE) ×1 IMPLANT
LENS CLRN VIVITY TORIC 3 18.5 ×1 IMPLANT
LENS IOL CLRN VT TRC 3 18.5 IMPLANT
NDL FILTER BLUNT 18X1 1/2 (NEEDLE) ×1 IMPLANT
NEEDLE FILTER BLUNT 18X1 1/2 (NEEDLE) ×1
SYR 3ML LL SCALE MARK (SYRINGE) ×1 IMPLANT
TIP IRRIGATON/ASPIRATION (MISCELLANEOUS) IMPLANT

## 2023-08-27 NOTE — Anesthesia Postprocedure Evaluation (Signed)
Anesthesia Post Note  Patient: Andre Stone  Procedure(s) Performed: CATARACT EXTRACTION PHACO AND INTRAOCULAR LENS PLACEMENT (IOC) RIGHT DIABETIC CLAREON VIVITY TORIC 5.55 00:25.6 (Right)  Patient location during evaluation: PACU Anesthesia Type: MAC Level of consciousness: awake and alert Pain management: pain level controlled Vital Signs Assessment: post-procedure vital signs reviewed and stable Respiratory status: spontaneous breathing, nonlabored ventilation, respiratory function stable and patient connected to nasal cannula oxygen Cardiovascular status: stable and blood pressure returned to baseline Postop Assessment: no apparent nausea or vomiting Anesthetic complications: no   No notable events documented.   Last Vitals:  Vitals:   08/27/23 0755 08/27/23 0800  BP:  109/74  Pulse: 73 69  Resp: 14 14  Temp:  36.5 C  SpO2: 95% 95%    Last Pain:  Vitals:   08/27/23 0800  TempSrc:   PainSc: 0-No pain                 Yevette Edwards

## 2023-08-27 NOTE — Anesthesia Preprocedure Evaluation (Signed)
Anesthesia Evaluation  Patient identified by MRN, date of birth, ID band Patient awake    Reviewed: Allergy & Precautions, H&P , NPO status , Patient's Chart, lab work & pertinent test results, reviewed documented beta blocker date and time   Airway Mallampati: II  TM Distance: >3 FB Neck ROM: full    Dental no notable dental hx. (+) Teeth Intact   Pulmonary sleep apnea , pneumonia, resolved   Pulmonary exam normal breath sounds clear to auscultation       Cardiovascular Exercise Tolerance: Good hypertension, + CAD and + Past MI  + dysrhythmias  Rhythm:regular Rate:Normal     Neuro/Psych negative neurological ROS  negative psych ROS   GI/Hepatic Neg liver ROS,GERD  ,,  Endo/Other  negative endocrine ROSdiabetes    Renal/GU Renal disease     Musculoskeletal   Abdominal   Peds  Hematology  (+) Blood dyscrasia, anemia   Anesthesia Other Findings   Reproductive/Obstetrics negative OB ROS                             Anesthesia Physical Anesthesia Plan  ASA: 3  Anesthesia Plan: MAC   Post-op Pain Management:    Induction:   PONV Risk Score and Plan:   Airway Management Planned:   Additional Equipment:   Intra-op Plan:   Post-operative Plan:   Informed Consent: I have reviewed the patients History and Physical, chart, labs and discussed the procedure including the risks, benefits and alternatives for the proposed anesthesia with the patient or authorized representative who has indicated his/her understanding and acceptance.       Plan Discussed with: CRNA  Anesthesia Plan Comments:        Anesthesia Quick Evaluation

## 2023-08-27 NOTE — H&P (Signed)
[facility]   Primary Care Physician:  Marisue Ivan, MD Ophthalmologist: Dr. Lockie Mola  Pre-Procedure History & Physical: HPI:  Andre Stone is a 77 y.o. male here for ophthalmic surgery.   Past Medical History:  Diagnosis Date   Anemia    Aortic atherosclerosis (HCC)    Borderline diabetes mellitus 01/27/2014   Cataract    Community acquired pneumonia 12/25/2015   Coronary artery disease involving native coronary artery of native heart with angina pectoris (HCC)    a.) cCTA 08/29/2022: Ca2+ = 937 (77th %'ile for age/sex/race match control); 50-60% pLAD, 25-49% pRCA, < 25% dLM and LCx --> FFRct : LAD (0.85), LCx (0.97), RCA (0.94)   Diverticulitis large intestine    Ectatic thoracic aorta (HCC) 08/29/2022   a.) cCTA 08/29/2022: asc Ao = 3.9 cm   Erectile dysfunction    a.) on PDE5i (sildenafil) PRN   Essential (primary) hypertension 12/28/2015   Gastro-esophageal reflux disease without esophagitis 12/28/2015   History of kidney stones    Hypertension    Insomnia    a.) takes trazodone PRN   Long term current use of aspirin    Mixed hyperlipidemia    Myocardial infarction (HCC)    possible small event EKG changes   OSA on CPAP    Osteoarthritis    RBBB (right bundle branch block)    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[facility])     Past Surgical History:  Procedure Laterality Date   CATARACT EXTRACTION W/PHACO Left 08/13/2023   Procedure: CATARACT EXTRACTION PHACO AND INTRAOCULAR LENS PLACEMENT (IOC) LEFT CLAREON VIVITY 4.78 00:36.5;  Surgeon: Lockie Mola, MD;  Location: [facility];  Service: Ophthalmology;  Laterality: Left;   CHOLECYSTECTOMY     COLONOSCOPY     2003, 2005, 2008   COLONOSCOPY  10/23/2012   COLONOSCOPY  01/01/2018   CYSTOSCOPY W/ RETROGRADES Right 11/02/2019   Procedure: CYSTOSCOPY WITH RETROGRADE PYELOGRAM;  Surgeon: Riki Altes, MD;  Location: [facility];  Service: Urology;  Laterality: Right;   CYSTOSCOPY  W/ RETROGRADES N/A 01/08/2022   Procedure: CYSTOSCOPY WITH RETROGRADE PYELOGRAM/ BLADDER BIOPSY;  Surgeon: Riki Altes, MD;  Location: [facility];  Service: Urology;  Laterality: N/A;   CYSTOSCOPY WITH BIOPSY N/A 11/02/2019   Procedure: CYSTOSCOPY WITH BIOPSY;  Surgeon: Riki Altes, MD;  Location: [facility];  Service: Urology;  Laterality: N/A;   CYSTOSCOPY/RETROGRADE/URETEROSCOPY/STONE EXTRACTION WITH BASKET Right 01/08/2022   Procedure: CYSTOSCOPY/RETROGRADE/URETEROSCOPY/STONE EXTRACTION WITH BASKET;  Surgeon: Riki Altes, MD;  Location: [facility];  Service: Urology;  Laterality: Right;   CYSTOSCOPY/URETEROSCOPY/HOLMIUM LASER/STENT PLACEMENT Right 12/29/2015   Procedure: CYSTOSCOPY/URETEROSCOPY/ with basketing of stone, right ureteral /STENT PLACEMENT;  Surgeon: Sebastian Ache, MD;  Location: [facility];  Service: Urology;  Laterality: Right;   CYSTOSCOPY/URETEROSCOPY/HOLMIUM LASER/STENT PLACEMENT Right 11/02/2019   Procedure: CYSTOSCOPY/URETEROSCOPY/HOLMIUM LASER/STENT PLACEMENT;  Surgeon: Riki Altes, MD;  Location: [facility];  Service: Urology;  Laterality: Right;   CYSTOSCOPY/URETEROSCOPY/HOLMIUM LASER/STENT PLACEMENT Right 01/08/2022   Procedure: CYSTOSCOPY/URETEROSCOPY/HOLMIUM LASER/STENT PLACEMENT;  Surgeon: Riki Altes, MD;  Location: [facility];  Service: Urology;  Laterality: Right;   ERCP  06/15/2003   ESOPHAGOGASTRODUODENOSCOPY     2003, 2004   TONSILLECTOMY  1957   TOTAL HIP ARTHROPLASTY Right 07/17/2023   Procedure: TOTAL HIP ARTHROPLASTY ANTERIOR APPROACH;  Surgeon: Reinaldo Berber, MD;  Location: [facility];  Service: Orthopedics;  Laterality: Right;   UPPER ESOPHAGEAL ENDOSCOPIC ULTRASOUND (EUS)  07/06/2003    Prior to Admission medications   Medication Sig Start  Date End Date Taking? Authorizing Provider  aspirin EC 81 MG tablet Take 81 mg by mouth daily.   Yes [provider]  atorvastatin (LIPITOR) 80 MG tablet Take 80 mg by mouth at bedtime.  06/11/22  Yes [provider]  Cholecalciferol 25 MCG (1000 UT) capsule Take 1,000 Units by mouth daily.   Yes [provider]  hydrochlorothiazide (HYDRODIURIL) 25 MG tablet Take 25 mg by mouth daily.   Yes [provider]  lisinopril (PRINIVIL,ZESTRIL) 40 MG tablet Take 40 mg by mouth daily.   Yes [provider]  Multiple Vitamins-Minerals (MULTIVITAMIN WITH MINERALS) tablet Take 1 tablet by mouth daily.   Yes [provider]  Omega-3 Fatty Acids (FISH OIL) 1000 MG CAPS Take 1,000 mg by mouth daily.   Yes [provider]  omeprazole (PRILOSEC) 20 MG capsule Take 20 mg by mouth daily.   Yes [provider]  sildenafil (REVATIO) 20 MG tablet Take 20 mg by mouth daily as needed (ED).   Yes [provider]  traZODone (DESYREL) 150 MG tablet Take 75 mg by mouth at bedtime.   Yes [provider]    Allergies as of 07/28/2023 - Review Complete 07/17/2023  Allergen Reaction Noted   Glucosamine-chondroitin Swelling 02/18/2018   Penicillins Rash and Other (See Comments) 12/25/2015    Family History  Problem Relation Age of Onset   Stroke Mother    AAA (abdominal aortic aneurysm) Father    Hypertension Father    Bladder Cancer Neg Hx    Kidney cancer Neg Hx    Prostate cancer Neg Hx     Social History   Socioeconomic History   Marital status: Married    Spouse name: Gaynelle   Number of children: Not on file   Years of education: Not on file   Highest education level: Not on file  Occupational History   Occupation: works as Education administrator in [employer]  Tobacco Use   Smoking status: Never    Passive exposure: Never   Smokeless tobacco: Never  Vaping Use   Vaping status: Never Used  Substance and Sexual Activity   Alcohol use: Yes    Alcohol/week: 1.0 standard drink of alcohol    Types: 1 Shots of liquor per week    Comment: regular, several times per week, liquor   Drug use: No   Sexual activity: Yes   Other Topics Concern   Not on file  Social History Narrative   Two daughters.  Lives with wife.  Retired superior court judge.    Social Determinants of Health   Financial Resource Strain: Low Risk  (05/21/2023)   Received from [facility]   Overall Financial Resource Strain (CARDIA)    Difficulty of Paying Living Expenses: Not hard at all  Food Insecurity: No Food Insecurity (07/17/2023)   Hunger Vital Sign    Worried About Running Out of Food in the Last Year: Never true    Ran Out of Food in the Last Year: Never true  Transportation Needs: No Transportation Needs (07/17/2023)   PRAPARE - Administrator, Civil Service (Medical): No    Lack of Transportation (Non-Medical): No  Physical Activity: Not on file  Stress: Not on file  Social Connections: Not on file  Intimate Partner Violence: Not At Risk (07/17/2023)   Humiliation, Afraid, Rape, and Kick questionnaire    Fear of Current or Ex-Partner: No    Emotionally Abused: No    Physically Abused:  No    Sexually Abused: No    Review of Systems: See HPI, otherwise negative ROS  Physical Exam: BP 130/77   Pulse 75   Temp (!) 97.4 F (36.3 C) (Temporal)   Resp 10   Ht 6' 0.01" (1.829 m)   Wt 90.7 kg   SpO2 96%   BMI 27.12 kg/m  General:   Alert,  pleasant and cooperative in NAD Head:  Normocephalic and atraumatic. Lungs:  Clear to auscultation.    Heart:  Regular rate and rhythm.   Impression/Plan: Andre Stone is here for ophthalmic surgery.  Risks, benefits, limitations, and alternatives regarding ophthalmic surgery have been reviewed with the patient.  Questions have been answered.  All parties agreeable.   Lockie Mola, MD  08/27/2023, 7:29 AM

## 2023-08-27 NOTE — Op Note (Signed)
LOCATION:  [facility]   PREOPERATIVE DIAGNOSIS:  Nuclear sclerotic cataract of the right eye.  H25.11   POSTOPERATIVE DIAGNOSIS:  Nuclear sclerotic cataract of the right eye.   PROCEDURE:  Phacoemulsification with Toric posterior chamber intraocular lens placement of the right eye.  Ultrasound time: Procedure(s): CATARACT EXTRACTION PHACO AND INTRAOCULAR LENS PLACEMENT (IOC) RIGHT DIABETIC CLAREON VIVITY TORIC 5.55 00:25.6 (Right)  LENS:   Implant Name Type Inv. Item Serial No. Manufacturer Lot No. LRB No. Used Action  LENS CLRN VIVITY TORIC 3 18.5 - [id number]  LENS CLRN VIVITY TORIC 3 18.5 [id number] [employer]  Right 1 Implanted     CNWET3 Vivity Toric intraocular lens with 1.5 diopters of cylindrical power with axis orientation at 8 degrees.   SURGEON:  Deirdre Evener, MD   ANESTHESIA: Topical with tetracaine drops and 2% Xylocaine jelly, augmented with 1% preservative-free intracameral lidocaine. .   COMPLICATIONS:  None.   DESCRIPTION OF PROCEDURE:  The patient was identified in the holding room and transported to the operating suite and placed in the supine position under the operating microscope.  The right eye was identified as the operative eye, and it was prepped and draped in the usual sterile ophthalmic fashion.    A clear-corneal paracentesis incision was made at the 12:00 position.  0.5 ml of preservative-free 1% lidocaine was injected into the anterior chamber. The anterior chamber was filled with Viscoat.  A 2.4 millimeter near clear corneal incision was then made at the 9:00 position.  A cystotome and capsulorrhexis forceps were then used to make a curvilinear capsulorrhexis.  Hydrodissection and hydrodelineation were then performed using balanced salt solution.   Phacoemulsification was then used in stop and chop fashion to remove the lens, nucleus and epinucleus.  The remaining cortex was aspirated using the irrigation and aspiration handpiece.   Provisc viscoelastic was then placed into the capsular bag to distend it for lens placement.  The Verion digital marker was used to align the implant at the intended axis.   A Toric lens was then injected into the capsular bag.  It was rotated clockwise until the axis marks on the lens were approximately 15 degrees in the counterclockwise direction to the intended alignment.  The viscoelastic was aspirated from the eye using the irrigation aspiration handpiece.  Then, a Koch spatula through the sideport incision was used to rotate the lens in a clockwise direction until the axis markings of the intraocular lens were lined up with the Verion alignment.  Balanced salt solution was then used to hydrate the wounds. Vigamox 0.2 ml of a 1mg  per ml solution was injected into the anterior chamber for a dose of 0.2 mg of intracameral antibiotic at the completion of the case.    The eye was noted to have a physiologic pressure and there was no wound leak noted.   Timolol and Brimonidine drops were applied to the eye.  The patient was taken to the recovery room in stable condition having had no complications of anesthesia or surgery.  Ayala Ribble 08/27/2023, 7:53 AM

## 2023-08-27 NOTE — Transfer of Care (Signed)
Immediate Anesthesia Transfer of Care Note  Patient: Andre Stone  Procedure(s) Performed: CATARACT EXTRACTION PHACO AND INTRAOCULAR LENS PLACEMENT (IOC) RIGHT DIABETIC CLAREON VIVITY TORIC 5.55 00:25.6 (Right)  Patient Location: PACU  Anesthesia Type: MAC  Level of Consciousness: awake, alert  and patient cooperative  Airway and Oxygen Therapy: Patient Spontanous Breathing and Patient connected to supplemental oxygen  Post-op Assessment: Post-op Vital signs reviewed, Patient's Cardiovascular Status Stable, Respiratory Function Stable, Patent Airway and No signs of Nausea or vomiting  Post-op Vital Signs: Reviewed and stable  Complications: No notable events documented.

## 2023-08-29 ENCOUNTER — Encounter: Payer: Self-pay | Admitting: *Deleted

## 2023-09-01 ENCOUNTER — Encounter: Payer: Self-pay | Admitting: Ophthalmology

## 2023-09-02 ENCOUNTER — Encounter: Payer: Self-pay | Admitting: Physician Assistant

## 2023-09-02 ENCOUNTER — Ambulatory Visit: Payer: Medicare PPO | Admitting: Physician Assistant

## 2023-09-02 DIAGNOSIS — C679 Malignant neoplasm of bladder, unspecified: Secondary | ICD-10-CM

## 2023-09-02 LAB — MICROSCOPIC EXAMINATION

## 2023-09-02 LAB — URINALYSIS, COMPLETE
Bilirubin, UA: NEGATIVE
Glucose, UA: NEGATIVE
Ketones, UA: NEGATIVE
Leukocytes,UA: NEGATIVE
Nitrite, UA: NEGATIVE
Protein,UA: NEGATIVE
RBC, UA: NEGATIVE
Specific Gravity, UA: >1.03 — ABNORMAL HIGH (ref 1.005–1.030)
Urobilinogen, Ur: 0.2 mg/dL (ref 0.2–1.0)
pH, UA: 5.5 (ref 5.0–7.5)

## 2023-09-02 MED ORDER — BCG LIVE 50 MG IS SUSR
3.2400 mL | Freq: Once | INTRAVESICAL | Status: AC
  Administered 2023-09-02: 81 mg via INTRAVESICAL

## 2023-09-02 NOTE — Progress Notes (Signed)
BCG Bladder Instillation  BCG # 2 of 3  Due to Bladder Cancer patient is present today for a BCG treatment. Patient was cleaned and prepped in a sterile fashion with betadine. A 14FR catheter was inserted, urine return was noted 25ml, urine was yellow in color.  50ml of reconstituted BCG was instilled into the bladder. The catheter was then removed. Patient tolerated well, no complications were noted  Performed by: Carman Ching, PA-C and Ples Specter, CMA  Follow up/ Additional notes: 1 week for BCG #3

## 2023-09-09 ENCOUNTER — Encounter: Payer: Self-pay | Admitting: Physician Assistant

## 2023-09-09 ENCOUNTER — Ambulatory Visit: Payer: Medicare PPO | Admitting: Physician Assistant

## 2023-09-09 DIAGNOSIS — C679 Malignant neoplasm of bladder, unspecified: Secondary | ICD-10-CM

## 2023-09-09 LAB — URINALYSIS, COMPLETE
Bilirubin, UA: NEGATIVE
Glucose, UA: NEGATIVE
Ketones, UA: NEGATIVE
Leukocytes,UA: NEGATIVE
Nitrite, UA: NEGATIVE
Protein,UA: NEGATIVE
RBC, UA: NEGATIVE
Specific Gravity, UA: 1.025 (ref 1.005–1.030)
Urobilinogen, Ur: 0.2 mg/dL (ref 0.2–1.0)
pH, UA: 5.5 (ref 5.0–7.5)

## 2023-09-09 LAB — MICROSCOPIC EXAMINATION

## 2023-09-09 MED ORDER — BCG LIVE 50 MG IS SUSR
3.2400 mL | Freq: Once | INTRAVESICAL | Status: AC
  Administered 2023-09-09: 81 mg via INTRAVESICAL

## 2023-09-09 NOTE — Progress Notes (Signed)
Patient ID: Andre Stone, male   DOB: 04/04/1946, 77 y.o.   MRN: [id number] BCG Bladder Instillation  BCG # 3 of 3  Due to Bladder Cancer patient is present today for a BCG treatment. Patient was cleaned and prepped in a sterile fashion with betadine. A 14FR catheter was inserted, urine return was noted 10ml, urine was yellow in color.  50ml of reconstituted BCG was instilled into the bladder. The catheter was then removed. Patient tolerated well, no complications were noted  Performed by: Carman Ching, PA an Mervin Hack, CMA  Follow up/ Additional notes: Cysto 10/2023

## 2023-09-18 NOTE — Progress Notes (Cosign Needed)
 Patient ID: Andre Stone, male   DOB: 04/04/1946, 77 y.o.   MRN: [id number] BCG Bladder Instillation  BCG # 3 of 3  Due to Bladder Cancer patient is present today for a BCG treatment. Patient was cleaned and prepped in a sterile fashion with betadine. A 14FR catheter was inserted, urine return was noted 10ml, urine was yellow in color.  50ml of reconstituted BCG was instilled into the bladder. The catheter was then removed. Patient tolerated well, no complications were noted  Performed by: Carman Ching, PA an Mervin Hack, CMA  Follow up/ Additional notes: Cysto 10/2023

## 2023-10-16 ENCOUNTER — Encounter: Payer: Self-pay | Admitting: Urology

## 2023-11-12 ENCOUNTER — Encounter: Payer: Self-pay | Admitting: Urology

## 2023-11-12 ENCOUNTER — Ambulatory Visit: Payer: Medicare PPO | Admitting: Urology

## 2023-11-12 VITALS — BP 130/84 | HR 69 | Ht 72.0 in | Wt 197.0 lb

## 2023-11-12 DIAGNOSIS — C679 Malignant neoplasm of bladder, unspecified: Secondary | ICD-10-CM

## 2023-11-12 DIAGNOSIS — Z8551 Personal history of malignant neoplasm of bladder: Secondary | ICD-10-CM

## 2023-11-12 LAB — URINALYSIS, COMPLETE
Bilirubin, UA: NEGATIVE
Glucose, UA: NEGATIVE
Ketones, UA: NEGATIVE
Leukocytes,UA: NEGATIVE
Nitrite, UA: NEGATIVE
Protein,UA: NEGATIVE
Specific Gravity, UA: 1.025 (ref 1.005–1.030)
Urobilinogen, Ur: 0.2 mg/dL (ref 0.2–1.0)
pH, UA: 5.5 (ref 5.0–7.5)

## 2023-11-12 LAB — MICROSCOPIC EXAMINATION: Bacteria, UA: NONE SEEN

## 2023-11-12 NOTE — Progress Notes (Unsigned)
   11/12/23  CC:  Chief Complaint  Patient presents with   Cysto    Urologic history: 1.  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  Uric acid nephrolithiasis Hypocitraturia, mild hyperuricemia and hyperuricosuria.  Urine pH 5.9 On potassium citrate Ureteroscopic stone removal 12/2015, 10/2019, 12/2021 Stone analysis 100% uric acid   HPI: 78 y.o. male presents for surveillance cystoscopy. He has no complaints.  UA today trace blood dipstick/microscopy negative  See rooming tab for vitals NED. A&Ox3.   No respiratory distress   Abd soft, NT, ND Normal phallus with bilateral descended testicles  Cystoscopy Procedure Note  Patient identification was confirmed, informed consent was obtained, and patient was prepped using Betadine solution.  Lidocaine jelly was administered per urethral meatus.     Pre-Procedure: - Inspection reveals a normal caliber urethral meatus.  Procedure: The flexible cystoscope was introduced without difficulty - No urethral strictures/lesions are present. - Moderate lateral lobe enlargement prostate; moderate median lobe - Normal bladder neck - Bilateral ureteral orifices identified - Bladder mucosa  reveals no ulcers, tumors, or lesions - No bladder stones - Mild trabeculation  Retroflexion shows no intravesical median lobe or tumor   Post-Procedure: - Patient tolerated the procedure well  Assessment/ Plan: No evidence recurrent urothelial carcinoma Follow-up surveillance  cystoscopy May 2025 If next cystoscopy negative will move to every 6 month surveillance cystoscopy He would be due for a 3-week course of maintenance BCG late May 2025 and will discuss further at time of cystoscopy    Riki Altes, MD

## 2023-11-24 DIAGNOSIS — N1831 Chronic kidney disease, stage 3a: Secondary | ICD-10-CM | POA: Insufficient documentation

## 2023-12-02 ENCOUNTER — Encounter: Payer: Self-pay | Admitting: *Deleted

## 2023-12-04 ENCOUNTER — Encounter: Payer: Self-pay | Admitting: *Deleted

## 2023-12-12 ENCOUNTER — Ambulatory Visit: Admitting: Anesthesiology

## 2023-12-12 ENCOUNTER — Ambulatory Visit
Admission: RE | Admit: 2023-12-12 | Discharge: 2023-12-12 | Disposition: A | Discharge status: Home / Self Care | Payer: Medicare PPO | Attending: Gastroenterology | Admitting: Gastroenterology

## 2023-12-12 ENCOUNTER — Encounter: Payer: Self-pay | Admitting: *Deleted

## 2023-12-12 ENCOUNTER — Encounter
Admission: RE | Disposition: A | Discharge status: Home / Self Care | Payer: Self-pay | Source: Home / Self Care | Attending: Gastroenterology

## 2023-12-12 DIAGNOSIS — I2510 Atherosclerotic heart disease of native coronary artery without angina pectoris: Secondary | ICD-10-CM | POA: Insufficient documentation

## 2023-12-12 DIAGNOSIS — I252 Old myocardial infarction: Secondary | ICD-10-CM | POA: Diagnosis not present

## 2023-12-12 DIAGNOSIS — Z1211 Encounter for screening for malignant neoplasm of colon: Secondary | ICD-10-CM | POA: Insufficient documentation

## 2023-12-12 DIAGNOSIS — Z860100 Personal history of colon polyps, unspecified: Secondary | ICD-10-CM | POA: Insufficient documentation

## 2023-12-12 DIAGNOSIS — K640 First degree hemorrhoids: Secondary | ICD-10-CM | POA: Diagnosis not present

## 2023-12-12 DIAGNOSIS — K219 Gastro-esophageal reflux disease without esophagitis: Secondary | ICD-10-CM | POA: Insufficient documentation

## 2023-12-12 DIAGNOSIS — I10 Essential (primary) hypertension: Secondary | ICD-10-CM | POA: Diagnosis not present

## 2023-12-12 DIAGNOSIS — I700 Atherosclerosis of aorta: Secondary | ICD-10-CM | POA: Diagnosis not present

## 2023-12-12 DIAGNOSIS — G4733 Obstructive sleep apnea (adult) (pediatric): Secondary | ICD-10-CM | POA: Insufficient documentation

## 2023-12-12 HISTORY — PX: COLONOSCOPY WITH PROPOFOL: SHX5780

## 2023-12-12 SURGERY — COLONOSCOPY WITH PROPOFOL
Anesthesia: General

## 2023-12-12 MED ORDER — LIDOCAINE HCL (CARDIAC) PF 100 MG/5ML IV SOSY
PREFILLED_SYRINGE | INTRAVENOUS | Status: DC | PRN
  Administered 2023-12-12: 50 mg via INTRAVENOUS

## 2023-12-12 MED ORDER — PROPOFOL 10 MG/ML IV BOLUS
INTRAVENOUS | Status: DC | PRN
  Administered 2023-12-12: 50 mg via INTRAVENOUS
  Administered 2023-12-12: 30 mg via INTRAVENOUS

## 2023-12-12 MED ORDER — PROPOFOL 500 MG/50ML IV EMUL
INTRAVENOUS | Status: DC | PRN
Start: 2023-12-12 — End: 2023-12-12
  Administered 2023-12-12: 75 ug/kg/min via INTRAVENOUS

## 2023-12-12 MED ORDER — STERILE WATER FOR IRRIGATION IR SOLN
Status: DC | PRN
  Administered 2023-12-12: 60 mL

## 2023-12-12 MED ORDER — DEXMEDETOMIDINE HCL IN NACL 80 MCG/20ML IV SOLN
INTRAVENOUS | Status: DC | PRN
Start: 2023-12-12 — End: 2023-12-12
  Administered 2023-12-12: 20 ug via INTRAVENOUS

## 2023-12-12 MED ORDER — EPHEDRINE SULFATE-NACL 50-0.9 MG/10ML-% IV SOSY
PREFILLED_SYRINGE | INTRAVENOUS | Status: DC | PRN
  Administered 2023-12-12: 15 mg via INTRAVENOUS
  Administered 2023-12-12: 10 mg via INTRAVENOUS

## 2023-12-12 MED ORDER — SODIUM CHLORIDE 0.9 % IV SOLN
INTRAVENOUS | Status: DC
  Administered 2023-12-12: 1000 mL via INTRAVENOUS

## 2023-12-12 NOTE — Op Note (Signed)
 [facility] Patient Name: Andre Stone Procedure Date: 12/12/2023 8:51 AM MRN: [id number] Account #: [id number] Date of Birth: 1946/03/22 Admit Type: Outpatient Age: 78 Room: [facility] ENDO ROOM 3 Gender: Male Note Status: Finalized Instrument Name: Prentice Docker [id number] Procedure:             Colonoscopy Indications:           High risk colon cancer surveillance: Personal history                         of colonic polyps, Last colonoscopy 5 years ago Providers:             Eather Colas MD, MD Medicines:             Monitored Anesthesia Care Complications:         No immediate complications. Procedure:             Pre-Anesthesia Assessment:                        - Prior to the procedure, a History and Physical was                         performed, and patient medications and allergies were                         reviewed. The patient is competent. The risks and                         benefits of the procedure and the sedation options and                         risks were discussed with the patient. All questions                         were answered and informed consent was obtained.                         Patient identification and proposed procedure were                         verified by the physician, the nurse, the                         anesthesiologist, the anesthetist and the technician                         in the endoscopy suite. Mental Status Examination:                         alert and oriented. Airway Examination: normal                         oropharyngeal airway and neck mobility. Respiratory                         Examination: clear to auscultation. CV Examination:                         normal. Prophylactic Antibiotics: The patient does  not                         require prophylactic antibiotics. Prior                         Anticoagulants: The patient has taken no anticoagulant                         or  antiplatelet agents. ASA Grade Assessment: III - A                         patient with severe systemic disease. After reviewing                         the risks and benefits, the patient was deemed in                         satisfactory condition to undergo the procedure. The                         anesthesia plan was to use monitored anesthesia care                         (MAC). Immediately prior to administration of                         medications, the patient was re-assessed for adequacy                         to receive sedatives. The heart rate, respiratory                         rate, oxygen saturations, blood pressure, adequacy of                         pulmonary ventilation, and response to care were                         monitored throughout the procedure. The physical                         status of the patient was re-assessed after the                         procedure.                        After obtaining informed consent, the colonoscope was                         passed under direct vision. Throughout the procedure,                         the patient's blood pressure, pulse, and oxygen                         saturations were monitored continuously. The  Colonoscope was introduced through the anus and                         advanced to the the cecum, identified by appendiceal                         orifice and ileocecal valve. The colonoscopy was                         performed without difficulty. The patient tolerated                         the procedure well. The quality of the bowel                         preparation was good. The ileocecal valve, appendiceal                         orifice, and rectum were photographed. Findings:      The perianal and digital rectal examinations were normal.      Internal hemorrhoids were found during retroflexion. The hemorrhoids       were Grade I (internal hemorrhoids that do not  prolapse).      The exam was otherwise without abnormality on direct and retroflexion       views. Impression:            - Internal hemorrhoids.                        - The examination was otherwise normal on direct and                         retroflexion views.                        - No specimens collected. Recommendation:        - Discharge patient to home.                        - Resume previous diet.                        - Continue present medications.                        - Repeat colonoscopy is not recommended due to current                         age (22 years or older) for surveillance.                        - Return to referring physician as previously                         scheduled. Procedure Code(s):     --- Professional ---                        N8295, Colorectal cancer screening; colonoscopy on  individual at high risk Diagnosis Code(s):     --- Professional ---                        Z86.010, Personal history of colonic polyps                        K64.0, First degree hemorrhoids CPT copyright 2022 [employer]. All rights reserved. The codes documented in this report are preliminary and upon coder review may  be revised to meet current compliance requirements. Eather Colas MD, MD 12/12/2023 9:30:19 AM Number of Addenda: 0 Note Initiated On: 12/12/2023 8:51 AM Scope Withdrawal Time: 0 hours 12 minutes 26 seconds  Total Procedure Duration: 0 hours 17 minutes 4 seconds  Estimated Blood Loss:  Estimated blood loss: none.      [facility]

## 2023-12-12 NOTE — H&P (Signed)
 Outpatient short stay form Pre-procedure 12/12/2023  Regis Bill, MD  Primary Physician: Marisue Ivan, MD  Reason for visit:  Surveillance colonoscopy  History of present illness:    78 y/o gentleman with history of hypertension, OSA, and arthritis here for surveillance colonoscopy. Last colonoscopy in 2019 was unremarkable. No blood thinners. History of cholecystectomy. No family history of GI malignancies.    Current Facility-Administered Medications:    0.9 %  sodium chloride infusion, , Intravenous, Continuous, Sandrine Bloodsworth, Rossie Muskrat, MD  Medications Prior to Admission  Medication Sig Dispense Refill Last Dose/Taking   aspirin EC 81 MG tablet Take 81 mg by mouth daily.   Past Week   atorvastatin (LIPITOR) 80 MG tablet Take 80 mg by mouth at bedtime.   Past Week   Cholecalciferol 25 MCG (1000 UT) capsule Take 1,000 Units by mouth daily.   Past Week   hydrochlorothiazide (HYDRODIURIL) 25 MG tablet Take 25 mg by mouth daily.   Past Week   lisinopril (PRINIVIL,ZESTRIL) 40 MG tablet Take 40 mg by mouth daily.   12/12/2023   lovastatin (ALTOPREV) 20 MG 24 hr tablet Take 20 mg by mouth at bedtime.   Past Week   Multiple Vitamins-Minerals (MULTIVITAMIN WITH MINERALS) tablet Take 1 tablet by mouth daily.   Past Week   Omega-3 Fatty Acids (FISH OIL) 1000 MG CAPS Take 1,000 mg by mouth daily.   Past Week   omeprazole (PRILOSEC) 20 MG capsule Take 20 mg by mouth daily.   12/11/2023 Morning   sildenafil (REVATIO) 20 MG tablet Take 20 mg by mouth daily as needed (ED).   Taking As Needed   traZODone (DESYREL) 150 MG tablet Take 75 mg by mouth at bedtime.   Past Week        Past Medical History:  Diagnosis Date   Anemia    Aortic atherosclerosis (HCC)    Borderline diabetes mellitus 01/27/2014   Cataract    Community acquired pneumonia 12/25/2015   Coronary artery disease involving native coronary artery of native heart with angina pectoris (HCC)    a.) cCTA 08/29/2022: Ca2+ =  937 (77th %'ile for age/sex/race match control); 50-60% pLAD, 25-49% pRCA, < 25% dLM and LCx --> FFRct : LAD (0.85), LCx (0.97), RCA (0.94)   Diverticulitis large intestine    Ectatic thoracic aorta (HCC) 08/29/2022   a.) cCTA 08/29/2022: asc Ao = 3.9 cm   Erectile dysfunction    a.) on PDE5i (sildenafil) PRN   Essential (primary) hypertension 12/28/2015   Gastro-esophageal reflux disease without esophagitis 12/28/2015   History of kidney stones    Hypertension    Insomnia    a.) takes trazodone PRN   Long term current use of aspirin    Mixed hyperlipidemia    Myocardial infarction (HCC)    possible small event EKG changes   OSA on CPAP    Osteoarthritis    RBBB (right bundle branch block)    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(HCC)     Review of systems:  Otherwise negative.    Physical Exam  Gen: Alert, oriented. Appears stated age.  HEENT: PERRLA. Lungs: No respiratory distress CV: RRR Abd: soft, benign, no masses Ext: No edema    Planned procedures: Proceed with colonoscopy. The patient understands the nature of the planned procedure, indications, risks, alternatives and potential complications including but not limited to bleeding, infection, perforation, damage to internal organs and possible oversedation/side effects from anesthesia. The patient agrees and gives consent to proceed.  Please refer  to procedure notes for findings, recommendations and patient disposition/instructions.     Regis Bill, MD [facility]

## 2023-12-12 NOTE — Anesthesia Postprocedure Evaluation (Signed)
 Anesthesia Post Note  Patient: Andre Stone  Procedure(s) Performed: COLONOSCOPY WITH PROPOFOL  Patient location during evaluation: PACU Anesthesia Type: General Level of consciousness: awake Pain management: pain level controlled Vital Signs Assessment: post-procedure vital signs reviewed and stable Respiratory status: respiratory function unstable Cardiovascular status: stable Anesthetic complications: no   No notable events documented.   Last Vitals:  Vitals:   12/12/23 0946 12/12/23 0955  BP: 127/78 122/72  Pulse: 73   Resp: 12   Temp:    SpO2: 96%     Last Pain:  Vitals:   12/12/23 0946  TempSrc:   PainSc: 0-No pain                 VAN STAVEREN,Cherina Dhillon

## 2023-12-12 NOTE — Interval H&P Note (Signed)
 History and Physical Interval Note:  12/12/2023 9:00 AM  Andre Stone  has presented today for surgery, with the diagnosis of hx of adenoamtous polyp of colon.  The various methods of treatment have been discussed with the patient and family. After consideration of risks, benefits and other options for treatment, the patient has consented to  Procedure(s) with comments: COLONOSCOPY WITH PROPOFOL (N/A) - PREFERS MID MORNING, IF POSSIBLE as a surgical intervention.  The patient's history has been reviewed, patient examined, no change in status, stable for surgery.  I have reviewed the patient's chart and labs.  Questions were answered to the patient's satisfaction.     Regis Bill  Ok to proceed with colonoscopy

## 2023-12-12 NOTE — Transfer of Care (Signed)
 Immediate Anesthesia Transfer of Care Note  Patient: Andre Stone  Procedure(s) Performed: COLONOSCOPY WITH PROPOFOL  Patient Location: PACU  Anesthesia Type:General  Level of Consciousness: sedated  Airway & Oxygen Therapy: Patient Spontanous Breathing  Post-op Assessment: Report given to RN and Post -op Vital signs reviewed and stable  Post vital signs: Reviewed and stable  Last Vitals:  Vitals Value Taken Time  BP 80/63 12/12/23 0930  Temp    Pulse 64 12/12/23 0930  Resp 13 12/12/23 0930  SpO2 96 % 12/12/23 0930    Last Pain:  Vitals:   12/12/23 0930  TempSrc:   PainSc: Asleep         Complications: No notable events documented.

## 2023-12-12 NOTE — Anesthesia Preprocedure Evaluation (Signed)
 Anesthesia Evaluation  Patient identified by MRN, date of birth, ID band Patient awake    Reviewed: Allergy & Precautions, NPO status , Patient's Chart, lab work & pertinent test results  Airway Mallampati: II  TM Distance: >3 FB Neck ROM: full    Dental  (+) Teeth Intact   Pulmonary neg pulmonary ROS, sleep apnea and Continuous Positive Airway Pressure Ventilation    Pulmonary exam normal breath sounds clear to auscultation       Cardiovascular Exercise Tolerance: Good hypertension, + CAD and + Past MI  negative cardio ROS Normal cardiovascular exam+ dysrhythmias  Rhythm:Regular Rate:Normal     Neuro/Psych negative neurological ROS  negative psych ROS   GI/Hepatic negative GI ROS, Neg liver ROS,GERD  ,,  Endo/Other  negative endocrine ROS    Renal/GU Renal diseasenegative Renal ROS  negative genitourinary   Musculoskeletal   Abdominal Normal abdominal exam  (+)   Peds negative pediatric ROS (+)  Hematology negative hematology ROS (+)   Anesthesia Other Findings Past Medical History: No date: Anemia No date: Aortic atherosclerosis (HCC) 01/27/2014: Borderline diabetes mellitus No date: Cataract 12/25/2015: Community acquired pneumonia No date: Coronary artery disease involving native coronary artery of  native heart with angina pectoris (HCC)     Comment:  a.) cCTA 08/29/2022: Ca2+ = 937 (77th %'ile for               age/sex/race match control); 50-60% pLAD, 25-49% pRCA, <               25% dLM and LCx --> FFRct : LAD (0.85), LCx (0.97), RCA               (0.94) No date: Diverticulitis large intestine 08/29/2022: Ectatic thoracic aorta (HCC)     Comment:  a.) cCTA 08/29/2022: asc Ao = 3.9 cm No date: Erectile dysfunction     Comment:  a.) on PDE5i (sildenafil) PRN 12/28/2015: Essential (primary) hypertension 12/28/2015: Gastro-esophageal reflux disease without esophagitis No date: History of kidney  stones No date: Hypertension No date: Insomnia     Comment:  a.) takes trazodone PRN No date: Long term current use of aspirin No date: Mixed hyperlipidemia No date: Myocardial infarction [facility])     Comment:  possible small event EKG changes No date: OSA on CPAP No date: Osteoarthritis No date: RBBB (right bundle branch block) No date: 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[facility])  Past Surgical History: 08/13/2023: CATARACT EXTRACTION W/PHACO; Left     Comment:  Procedure: CATARACT EXTRACTION PHACO AND INTRAOCULAR               LENS PLACEMENT (IOC) LEFT CLAREON VIVITY 4.78 00:36.5;                Surgeon: Lockie Mola, MD;  Location: [facility]               SURGERY CNTR;  Service: Ophthalmology;  Laterality: Left; 08/27/2023: CATARACT EXTRACTION W/PHACO; Right     Comment:  Procedure: CATARACT EXTRACTION PHACO AND INTRAOCULAR               LENS PLACEMENT (IOC) RIGHT DIABETIC CLAREON VIVITY TORIC               5.55 00:25.6;  Surgeon: Lockie Mola, MD;                Location: [facility] SURGERY [facility];  Service: Ophthalmology;                Laterality: Right; No  date: CHOLECYSTECTOMY No date: COLONOSCOPY     Comment:  2003, 2005, 2008 10/23/2012: COLONOSCOPY 01/01/2018: COLONOSCOPY 11/02/2019: CYSTOSCOPY W/ RETROGRADES; Right     Comment:  Procedure: CYSTOSCOPY WITH RETROGRADE PYELOGRAM;                Surgeon: Riki Altes, MD;  Location: [facility] ORS;                Service: Urology;  Laterality: Right; 01/08/2022: CYSTOSCOPY W/ RETROGRADES; N/A     Comment:  Procedure: CYSTOSCOPY WITH RETROGRADE PYELOGRAM/ BLADDER              BIOPSY;  Surgeon: Riki Altes, MD;  Location: [facility]               ORS;  Service: Urology;  Laterality: N/A; 11/02/2019: CYSTOSCOPY WITH BIOPSY; N/A     Comment:  Procedure: CYSTOSCOPY WITH BIOPSY;  Surgeon: Riki Altes, MD;  Location: [facility] ORS;  Service: Urology;                Laterality: N/A; 01/08/2022:  CYSTOSCOPY/RETROGRADE/URETEROSCOPY/STONE EXTRACTION WITH  BASKET; Right     Comment:  Procedure: CYSTOSCOPY/RETROGRADE/URETEROSCOPY/STONE               EXTRACTION WITH BASKET;  Surgeon: Riki Altes, MD;                Location: [facility] ORS;  Service: Urology;  Laterality:               Right; 12/29/2015: CYSTOSCOPY/URETEROSCOPY/HOLMIUM LASER/STENT PLACEMENT;  Right     Comment:  Procedure: CYSTOSCOPY/URETEROSCOPY/ with basketing of               stone, right ureteral /STENT PLACEMENT;  Surgeon:               Sebastian Ache, MD;  Location: [facility] ORS;  Service:               Urology;  Laterality: Right; 11/02/2019: CYSTOSCOPY/URETEROSCOPY/HOLMIUM LASER/STENT PLACEMENT;  Right     Comment:  Procedure: CYSTOSCOPY/URETEROSCOPY/HOLMIUM LASER/STENT               PLACEMENT;  Surgeon: Riki Altes, MD;  Location:               [facility] ORS;  Service: Urology;  Laterality: Right; 01/08/2022: CYSTOSCOPY/URETEROSCOPY/HOLMIUM LASER/STENT PLACEMENT;  Right     Comment:  Procedure: CYSTOSCOPY/URETEROSCOPY/HOLMIUM LASER/STENT               PLACEMENT;  Surgeon: Riki Altes, MD;  Location:               [facility] ORS;  Service: Urology;  Laterality: Right; 06/15/2003: ERCP No date: ESOPHAGOGASTRODUODENOSCOPY     Comment:  2003, 2004 No date: EYE SURGERY No date: JOINT REPLACEMENT 1957: TONSILLECTOMY 07/17/2023: TOTAL HIP ARTHROPLASTY; Right     Comment:  Procedure: TOTAL HIP ARTHROPLASTY ANTERIOR APPROACH;                Surgeon: Reinaldo Berber, MD;  Location: [facility] ORS;                Service: Orthopedics;  Laterality: Right; 07/06/2003: UPPER ESOPHAGEAL ENDOSCOPIC ULTRASOUND (EUS)  BMI    Body Mass Index: 27.26 kg/m      Reproductive/Obstetrics negative OB ROS  Anesthesia Physical Anesthesia Plan  ASA: 3  Anesthesia Plan: General   Post-op Pain Management:    Induction: Intravenous  PONV Risk Score and Plan: Propofol infusion  and TIVA  Airway Management Planned: Natural Airway and Nasal Cannula  Additional Equipment:   Intra-op Plan:   Post-operative Plan:   Informed Consent: I have reviewed the patients History and Physical, chart, labs and discussed the procedure including the risks, benefits and alternatives for the proposed anesthesia with the patient or authorized representative who has indicated his/her understanding and acceptance.     Dental Advisory Given  Plan Discussed with: CRNA  Anesthesia Plan Comments:        Anesthesia Quick Evaluation

## 2024-02-11 ENCOUNTER — Ambulatory Visit: Payer: Medicare PPO | Admitting: Urology

## 2024-02-11 ENCOUNTER — Encounter: Payer: Self-pay | Admitting: Urology

## 2024-02-11 VITALS — BP 112/66 | HR 67 | Ht 72.0 in | Wt 195.0 lb

## 2024-02-11 DIAGNOSIS — Z8551 Personal history of malignant neoplasm of bladder: Secondary | ICD-10-CM | POA: Diagnosis not present

## 2024-02-11 LAB — URINALYSIS, COMPLETE
Bilirubin, UA: NEGATIVE
Glucose, UA: NEGATIVE
Ketones, UA: NEGATIVE
Leukocytes,UA: NEGATIVE
Nitrite, UA: NEGATIVE
Protein,UA: NEGATIVE
RBC, UA: NEGATIVE
Specific Gravity, UA: 1.025 (ref 1.005–1.030)
Urobilinogen, Ur: 0.2 mg/dL (ref 0.2–1.0)
pH, UA: 6 (ref 5.0–7.5)

## 2024-02-11 LAB — MICROSCOPIC EXAMINATION

## 2024-02-11 NOTE — Progress Notes (Signed)
   02/11/24  CC:  Chief Complaint  Patient presents with   Cysto    Urologic history: 1.  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  Uric acid nephrolithiasis Hypocitraturia, mild hyperuricemia and hyperuricosuria.  Urine pH 5.9 On potassium citrate Ureteroscopic stone removal 12/2015, 10/2019, 12/2021 Stone analysis 100% uric acid   HPI: 78 y.o. male presents for surveillance cystoscopy. He has no complaints.  UA today dipstick/microscopy negative  See rooming tab for vitals   Cystoscopy Procedure Note  Patient identification was confirmed, informed consent was obtained, and patient was prepped using Betadine solution.  Lidocaine  jelly was administered per urethral meatus.     Pre-Procedure: - Inspection reveals a normal caliber urethral meatus.  Procedure: The flexible cystoscope was introduced without difficulty - No urethral strictures/lesions are present. - Moderate lateral lobe enlargement prostate; moderate median lobe - Normal bladder neck - Bilateral ureteral orifices identified - Bladder mucosa  reveals no ulcers, tumors, or lesions - No bladder stones - Mild trabeculation  Retroflexion shows no intravesical median lobe or tumor   Post-Procedure: - Patient tolerated the procedure well  Assessment/ Plan: No evidence recurrent urothelial carcinoma 2 years out without evidence of recurrent tumor and will move to every 6 months surveillance cystoscopy He desires to continue maintenance BCG and  will schedule late May/early June   Geraline Knapp, MD

## 2024-03-04 ENCOUNTER — Ambulatory Visit: Admitting: Physician Assistant

## 2024-03-04 VITALS — BP 106/54 | HR 130 | Ht 72.0 in | Wt 196.0 lb

## 2024-03-04 DIAGNOSIS — C679 Malignant neoplasm of bladder, unspecified: Secondary | ICD-10-CM

## 2024-03-04 DIAGNOSIS — Z8551 Personal history of malignant neoplasm of bladder: Secondary | ICD-10-CM

## 2024-03-04 LAB — URINALYSIS, COMPLETE
Bilirubin, UA: NEGATIVE
Glucose, UA: NEGATIVE
Ketones, UA: NEGATIVE
Leukocytes,UA: NEGATIVE
Nitrite, UA: NEGATIVE
Protein,UA: NEGATIVE
RBC, UA: NEGATIVE
Specific Gravity, UA: 1.02 (ref 1.005–1.030)
Urobilinogen, Ur: 0.2 mg/dL (ref 0.2–1.0)
pH, UA: 6 (ref 5.0–7.5)

## 2024-03-04 LAB — MICROSCOPIC EXAMINATION

## 2024-03-04 MED ORDER — BCG LIVE 50 MG IS SUSR
3.2400 mL | Freq: Once | INTRAVESICAL | Status: AC
  Administered 2024-03-04: 81 mg via INTRAVESICAL

## 2024-03-04 NOTE — Progress Notes (Signed)
 BCG Bladder Instillation  BCG # 1  Due to Bladder Cancer patient is present today for a BCG treatment. Patient was cleaned and prepped in a sterile fashion with betadine. A 14FR catheter was inserted, urine return was noted 25ml, urine was yellow in color.  50ml of reconstituted BCG was instilled into the bladder. The catheter was then removed. Patient tolerated well, no complications were noted  Performed by: Ryland Tungate, PA-C and Jola Nash, CMA  Follow up: 1 week

## 2024-03-11 ENCOUNTER — Ambulatory Visit: Admitting: Physician Assistant

## 2024-03-11 VITALS — BP 120/73 | HR 80

## 2024-03-11 DIAGNOSIS — C679 Malignant neoplasm of bladder, unspecified: Secondary | ICD-10-CM | POA: Diagnosis not present

## 2024-03-11 DIAGNOSIS — I4510 Unspecified right bundle-branch block: Secondary | ICD-10-CM | POA: Insufficient documentation

## 2024-03-11 DIAGNOSIS — Z8551 Personal history of malignant neoplasm of bladder: Secondary | ICD-10-CM

## 2024-03-11 LAB — MICROSCOPIC EXAMINATION

## 2024-03-11 LAB — URINALYSIS, COMPLETE
Bilirubin, UA: NEGATIVE
Glucose, UA: NEGATIVE
Ketones, UA: NEGATIVE
Leukocytes,UA: NEGATIVE
Nitrite, UA: NEGATIVE
Protein,UA: NEGATIVE
Specific Gravity, UA: 1.02 (ref 1.005–1.030)
Urobilinogen, Ur: 0.2 mg/dL (ref 0.2–1.0)
pH, UA: 6 (ref 5.0–7.5)

## 2024-03-11 MED ORDER — BCG LIVE 50 MG IS SUSR
3.2400 mL | Freq: Once | INTRAVESICAL | Status: AC
  Administered 2024-03-11: 81 mg via INTRAVESICAL

## 2024-03-11 NOTE — Progress Notes (Signed)
 BCG Bladder Instillation  BCG # 2 of 3  Due to Bladder Cancer patient is present today for a BCG treatment. Patient was cleaned and prepped in a sterile fashion with betadine. A 14FR catheter was inserted, urine return was noted 50ml, urine was yellow in color.  50ml of reconstituted BCG was instilled into the bladder. The catheter was then removed. Patient tolerated well, no complications were noted  Performed by: Sharen Youngren, PA-C an Anastasiya Hopkins, CMA  Follow up: 1 week

## 2024-03-11 NOTE — Progress Notes (Signed)
  Cardiology Office Note:   Date:  03/12/2024  ID:  Koben, Daman 1946/04/08, MRN [id number] PCP: Alla Amis, MD  [facility] Providers Cardiologist:  Lynwood Schilling, MD {  History of Present Illness:   Andre Stone is a 78 y.o. male who presents for evaluation of elevated coronary calcium .  He having chest discomfort which is why he had the coronary calcium  score ordered.  His only past cardiac history was stress perfusion study in 2019 for evaluation of chest discomfort.  He had a well-preserved ejection fraction of 62% and no ischemia.  I reviewed these records for this visit.  His calcium  score done recently was 937 with 3 vessels demonstrating calcium .  He was 77th percentile.  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     Since I last saw him he has had no cardiac problems.   He does get fleeting rare chest discomfort sporadically.  However, this is not changed from previous.  He actually went through hip replacement rehab and he had no issues with this.  He takes 8000 steps a day although he is starting to have some foot pain and knee problems with this.  With all of this activity and with his surgeries he had no cardiovascular complaints. The patient denies any new symptoms such as chest discomfort, neck or arm discomfort. There has been no new shortness of breath, PND or orthopnea. There have been no reported palpitations, presyncope or syncope.   ROS: As stated in the HPI and negative for all other systems.  Studies Reviewed:    EKG:     Sinus rhythm, rate 70, axis within normal limits, intervals within normal limits, incomplete right bundle branch block, 07/08/2023    Risk Assessment/Calculations:              Physical Exam:   VS:  BP 137/81   Pulse 63   Ht 6' (1.829 m)   Wt 194 lb (88 kg)   SpO2 97%   BMI 26.31 kg/m    Wt Readings from Last 3 Encounters:  03/12/24 194 lb (88 kg)  03/04/24 196 lb  (88.9 kg)  02/11/24 195 lb (88.5 kg)     GEN: Well nourished, well developed in no acute distress NECK: No JVD; No carotid bruits CARDIAC: RRR, no murmurs, rubs, gallops RESPIRATORY:  Clear to auscultation without rales, wheezing or rhonchi  ABDOMEN: Soft, non-tender, non-distended EXTREMITIES:  No edema; No deformity   ASSESSMENT AND PLAN:   CAD: The patient has no new sypmtoms.  No further cardiovascular testing is indicated.  We will continue with aggressive risk reduction and meds as listed.  HTN:   The blood pressure at target.  No change in therapy  Dyslipidemia: LDL was 53 in February.  No change in therapy.    Right bundle branch block:   This has been an incomplete right bundle branch block and unchanged on previous EKGs.   Sleep apnea:    He uses a CPAP.    Follow up with me in 2 years.  Signed, Lynwood Schilling, MD

## 2024-03-12 ENCOUNTER — Encounter: Payer: Self-pay | Admitting: Cardiology

## 2024-03-12 ENCOUNTER — Ambulatory Visit: Attending: Cardiology | Admitting: Cardiology

## 2024-03-12 VITALS — BP 137/81 | HR 63 | Ht 72.0 in | Wt 194.0 lb

## 2024-03-12 DIAGNOSIS — I10 Essential (primary) hypertension: Secondary | ICD-10-CM | POA: Diagnosis not present

## 2024-03-12 DIAGNOSIS — I4510 Unspecified right bundle-branch block: Secondary | ICD-10-CM | POA: Diagnosis not present

## 2024-03-12 DIAGNOSIS — I2510 Atherosclerotic heart disease of native coronary artery without angina pectoris: Secondary | ICD-10-CM

## 2024-03-12 DIAGNOSIS — G4730 Sleep apnea, unspecified: Secondary | ICD-10-CM

## 2024-03-12 DIAGNOSIS — E785 Hyperlipidemia, unspecified: Secondary | ICD-10-CM

## 2024-03-12 NOTE — Patient Instructions (Signed)
 Medication Instructions:  Your physician recommends that you continue on your current medications as directed. Please refer to the Current Medication list given to you today.  *If you need a refill on your cardiac medications before your next appointment, please call your pharmacy*  Lab Work: NONE If you have labs (blood work) drawn today and your tests are completely normal, you will receive your results only by: MyChart Message (if you have MyChart) OR A paper copy in the mail If you have any lab test that is abnormal or we need to change your treatment, we will call you to review the results.  Testing/Procedures: NONE  Follow-Up: At [facility], you and your health needs are our priority.  As part of our continuing mission to provide you with exceptional heart care, our providers are all part of one team.  This team includes your primary Cardiologist (physician) and Advanced Practice Providers or APPs (Physician Assistants and Nurse Practitioners) who all work together to provide you with the care you need, when you need it.  Your next appointment:   2 year(s)  Provider:   Lavonne Prairie, MD  We recommend signing up for the patient portal called MyChart.  Sign up information is provided on this After Visit Summary.  MyChart is used to connect with patients for Virtual Visits (Telemedicine).  Patients are able to view lab/test results, encounter notes, upcoming appointments, etc.  Non-urgent messages can be sent to your provider as well.   To learn more about what you can do with MyChart, go to [email or web address].

## 2024-03-17 ENCOUNTER — Ambulatory Visit: Admitting: Physician Assistant

## 2024-03-17 VITALS — BP 143/90 | HR 66 | Ht 72.0 in | Wt 198.0 lb

## 2024-03-17 DIAGNOSIS — C679 Malignant neoplasm of bladder, unspecified: Secondary | ICD-10-CM | POA: Diagnosis not present

## 2024-03-17 DIAGNOSIS — Z8551 Personal history of malignant neoplasm of bladder: Secondary | ICD-10-CM

## 2024-03-17 LAB — URINALYSIS, COMPLETE
Bilirubin, UA: NEGATIVE
Glucose, UA: NEGATIVE
Ketones, UA: NEGATIVE
Leukocytes,UA: NEGATIVE
Nitrite, UA: NEGATIVE
Protein,UA: NEGATIVE
RBC, UA: NEGATIVE
Specific Gravity, UA: 1.015 (ref 1.005–1.030)
Urobilinogen, Ur: 0.2 mg/dL (ref 0.2–1.0)
pH, UA: 6 (ref 5.0–7.5)

## 2024-03-17 LAB — MICROSCOPIC EXAMINATION: Bacteria, UA: NONE SEEN

## 2024-03-17 MED ORDER — BCG LIVE 50 MG IS SUSR
3.2400 mL | Freq: Once | INTRAVESICAL | Status: AC
Start: 2024-03-17 — End: 2024-03-17
  Administered 2024-03-17: 81 mg via INTRAVESICAL

## 2024-03-17 NOTE — Progress Notes (Signed)
 BCG Bladder Instillation  BCG # 3 of 3  Due to Bladder Cancer patient is present today for a BCG treatment. Patient was cleaned and prepped in a sterile fashion with betadine. A 14FR catheter was inserted, urine return was noted 10ml, urine was yellow in color.  50ml of reconstituted BCG was instilled into the bladder. The catheter was then removed. Patient tolerated well, no complications were noted  Performed by: Tiyah Zelenak, PA-C   Follow up: Cysto in November

## 2024-03-18 ENCOUNTER — Ambulatory Visit: Admitting: Physician Assistant

## 2024-07-12 ENCOUNTER — Ambulatory Visit

## 2024-07-12 DIAGNOSIS — Z23 Encounter for immunization: Secondary | ICD-10-CM | POA: Diagnosis not present

## 2024-07-12 DIAGNOSIS — Z719 Counseling, unspecified: Secondary | ICD-10-CM

## 2024-07-12 NOTE — Progress Notes (Signed)
 In nurse clinic for covid vaccine today. Tolerated vaccine well L delt. VIS provided and copy of [facility].

## 2024-08-13 ENCOUNTER — Encounter: Payer: Self-pay | Admitting: Urology

## 2024-08-13 ENCOUNTER — Ambulatory Visit: Admitting: Urology

## 2024-08-13 VITALS — BP 150/76 | HR 80 | Ht 72.0 in | Wt 194.0 lb

## 2024-08-13 DIAGNOSIS — C679 Malignant neoplasm of bladder, unspecified: Secondary | ICD-10-CM

## 2024-08-13 LAB — URINALYSIS, COMPLETE
Bilirubin, UA: NEGATIVE
Glucose, UA: NEGATIVE
Ketones, UA: NEGATIVE
Leukocytes,UA: NEGATIVE
Nitrite, UA: NEGATIVE
Protein,UA: NEGATIVE
RBC, UA: NEGATIVE
Specific Gravity, UA: 1.025 (ref 1.005–1.030)
Urobilinogen, Ur: 0.2 mg/dL (ref 0.2–1.0)
pH, UA: 6 (ref 5.0–7.5)

## 2024-08-13 LAB — MICROSCOPIC EXAMINATION

## 2024-08-13 NOTE — Progress Notes (Signed)
   08/13/24  CC:  Chief Complaint  Patient presents with   Cysto    Urologic history: 1.  Ta urothelial carcinoma bladder Incidentally discovered < 5 mm papillary bladder tumor at time of ureteroscopy February 2021; Ta low-grade Right ureteroscopic stone removal 01/08/2022 and incidentally noted to have multifocal papillary tumors right bladder base, left posterior wall and left bladder base; biopsy/fulguration performed which showed high-grade papillary urothelial carcinoma without lamina propria/muscle invasion Induction BCG x6 completed 04/11/2022 Maintenance BCG x 3: 08/09/2022, 02/13/2023, 09/09/2023 03/17/2024   2.  Uric acid nephrolithiasis Hypocitraturia, mild hyperuricemia and hyperuricosuria.  Urine pH 5.9 On potassium citrate Ureteroscopic stone removal 12/2015, 10/2019, 12/2021 Stone analysis 100% uric acid   HPI: 78 y.o. male presents for surveillance cystoscopy. He has no complaints.  UA today dipstick/microscopy negative  See rooming tab for vitals   Cystoscopy Procedure Note  Patient identification was confirmed, informed consent was obtained, and patient was prepped using Betadine solution.  Lidocaine  jelly was administered per urethral meatus.     Pre-Procedure: - Inspection reveals a normal caliber urethral meatus.  Procedure: The flexible cystoscope was introduced without difficulty - No urethral strictures/lesions are present. - Moderate lateral lobe enlargement prostate; moderate median lobe - Normal bladder neck - Bilateral ureteral orifices identified - Bladder mucosa  reveals no ulcers, tumors, or lesions - No bladder stones - Mild trabeculation  Retroflexion shows no intravesical median lobe or tumor   Post-Procedure: - Patient tolerated the procedure well  Assessment/ Plan: No evidence recurrent urothelial carcinoma Follow-up surveillance cystoscopy 6 months Maintenance BCG completed   Glendia JAYSON Barba, MD

## 2024-10-12 ENCOUNTER — Other Ambulatory Visit: Payer: Self-pay | Admitting: Physical Medicine & Rehabilitation

## 2024-10-12 DIAGNOSIS — G8929 Other chronic pain: Secondary | ICD-10-CM

## 2024-10-14 ENCOUNTER — Observation Stay
Admission: EM | Admit: 2024-10-14 | Discharge: 2024-10-15 | Disposition: A | Discharge status: Home / Self Care | Source: Ambulatory Visit | Attending: Emergency Medicine | Admitting: Emergency Medicine

## 2024-10-14 ENCOUNTER — Telehealth: Payer: Self-pay | Admitting: Cardiology

## 2024-10-14 ENCOUNTER — Encounter: Payer: Self-pay | Admitting: Intensive Care

## 2024-10-14 ENCOUNTER — Emergency Department

## 2024-10-14 ENCOUNTER — Other Ambulatory Visit: Payer: Self-pay

## 2024-10-14 DIAGNOSIS — I10 Essential (primary) hypertension: Secondary | ICD-10-CM | POA: Diagnosis not present

## 2024-10-14 DIAGNOSIS — Z96641 Presence of right artificial hip joint: Secondary | ICD-10-CM | POA: Diagnosis not present

## 2024-10-14 DIAGNOSIS — D696 Thrombocytopenia, unspecified: Secondary | ICD-10-CM | POA: Insufficient documentation

## 2024-10-14 DIAGNOSIS — E1159 Type 2 diabetes mellitus with other circulatory complications: Secondary | ICD-10-CM

## 2024-10-14 DIAGNOSIS — I129 Hypertensive chronic kidney disease with stage 1 through stage 4 chronic kidney disease, or unspecified chronic kidney disease: Secondary | ICD-10-CM | POA: Diagnosis not present

## 2024-10-14 DIAGNOSIS — I2081 Angina pectoris with coronary microvascular dysfunction: Secondary | ICD-10-CM | POA: Diagnosis not present

## 2024-10-14 DIAGNOSIS — I25110 Atherosclerotic heart disease of native coronary artery with unstable angina pectoris: Secondary | ICD-10-CM | POA: Diagnosis not present

## 2024-10-14 DIAGNOSIS — E782 Mixed hyperlipidemia: Secondary | ICD-10-CM | POA: Diagnosis not present

## 2024-10-14 DIAGNOSIS — E1122 Type 2 diabetes mellitus with diabetic chronic kidney disease: Secondary | ICD-10-CM | POA: Insufficient documentation

## 2024-10-14 DIAGNOSIS — I2510 Atherosclerotic heart disease of native coronary artery without angina pectoris: Secondary | ICD-10-CM | POA: Diagnosis present

## 2024-10-14 DIAGNOSIS — Z7982 Long term (current) use of aspirin: Secondary | ICD-10-CM | POA: Diagnosis not present

## 2024-10-14 DIAGNOSIS — R079 Chest pain, unspecified: Principal | ICD-10-CM | POA: Insufficient documentation

## 2024-10-14 DIAGNOSIS — N182 Chronic kidney disease, stage 2 (mild): Secondary | ICD-10-CM | POA: Insufficient documentation

## 2024-10-14 DIAGNOSIS — I252 Old myocardial infarction: Secondary | ICD-10-CM | POA: Diagnosis not present

## 2024-10-14 DIAGNOSIS — Z79899 Other long term (current) drug therapy: Secondary | ICD-10-CM | POA: Diagnosis not present

## 2024-10-14 DIAGNOSIS — I200 Unstable angina: Principal | ICD-10-CM | POA: Insufficient documentation

## 2024-10-14 DIAGNOSIS — E119 Type 2 diabetes mellitus without complications: Secondary | ICD-10-CM

## 2024-10-14 LAB — TROPONIN T, HIGH SENSITIVITY
Troponin T High Sensitivity: 10 ng/L (ref 0–19)
Troponin T High Sensitivity: 9 ng/L (ref 0–19)

## 2024-10-14 LAB — CBC
HCT: 42.3 % (ref 39.0–52.0)
Hemoglobin: 14.5 g/dL (ref 13.0–17.0)
MCH: 29.8 pg (ref 26.0–34.0)
MCHC: 34.3 g/dL (ref 30.0–36.0)
MCV: 86.9 fL (ref 80.0–100.0)
Platelets: 169 K/uL (ref 150–400)
RBC: 4.87 MIL/uL (ref 4.22–5.81)
RDW: 13.3 % (ref 11.5–15.5)
WBC: 6.3 K/uL (ref 4.0–10.5)
nRBC: 0 % (ref 0.0–0.2)

## 2024-10-14 LAB — BASIC METABOLIC PANEL WITH GFR
Anion gap: 12 (ref 5–15)
BUN: 26 mg/dL — ABNORMAL HIGH (ref 8–23)
CO2: 24 mmol/L (ref 22–32)
Calcium: 9.7 mg/dL (ref 8.9–10.3)
Chloride: 102 mmol/L (ref 98–111)
Creatinine, Ser: 1.26 mg/dL — ABNORMAL HIGH (ref 0.61–1.24)
GFR, Estimated: 58 mL/min — ABNORMAL LOW
Glucose, Bld: 129 mg/dL — ABNORMAL HIGH (ref 70–99)
Potassium: 3.9 mmol/L (ref 3.5–5.1)
Sodium: 138 mmol/L (ref 135–145)

## 2024-10-14 LAB — PROTIME-INR
INR: 1 (ref 0.8–1.2)
Prothrombin Time: 13.5 s (ref 11.4–15.2)

## 2024-10-14 LAB — APTT: aPTT: 26 s (ref 24–36)

## 2024-10-14 MED ORDER — TRAZODONE HCL 50 MG PO TABS
150.0000 mg | ORAL_TABLET | Freq: Every evening | ORAL | Status: DC | PRN
  Administered 2024-10-14: 150 mg via ORAL
  Filled 2024-10-14: qty 1

## 2024-10-14 MED ORDER — SODIUM CHLORIDE 0.9 % IV SOLN: INTRAVENOUS | Status: DC

## 2024-10-14 MED ORDER — HEPARIN (PORCINE) 25000 UT/250ML-% IV SOLN
1150.0000 [IU]/h | INTRAVENOUS | Status: DC
  Administered 2024-10-14: 1150 [IU]/h via INTRAVENOUS
  Filled 2024-10-14: qty 250

## 2024-10-14 MED ORDER — ASPIRIN 81 MG PO TBEC
81.0000 mg | DELAYED_RELEASE_TABLET | Freq: Every day | ORAL | Status: DC
  Administered 2024-10-14: 81 mg via ORAL
  Filled 2024-10-14: qty 1

## 2024-10-14 MED ORDER — ATORVASTATIN CALCIUM 20 MG PO TABS
80.0000 mg | ORAL_TABLET | Freq: Every day | ORAL | Status: DC
  Administered 2024-10-14: 80 mg via ORAL
  Filled 2024-10-14: qty 4

## 2024-10-14 MED ORDER — HEPARIN BOLUS VIA INFUSION
4000.0000 [IU] | Freq: Once | INTRAVENOUS | Status: AC
  Administered 2024-10-14: 4000 [IU] via INTRAVENOUS
  Filled 2024-10-14: qty 4000

## 2024-10-14 NOTE — H&P (Signed)
 " History and Physical    Andre Stone FMW:[id number] DOB: [birth year] DOA: 10/14/2024  PCP: Alla Amis, MD (Confirm with patient/family/NH records and if not entered, this has to be entered at [facility] point of entry) Patient coming from: Home  I have personally briefly reviewed patient's old medical records in [facility]  Chief Complaint: Chest pain  HPI: Andre Stone is a 79 y.o. male with medical history significant of HTN, HLD, CKD stage III, GERD, presented with new onset chest pains.  Patient reported that he used to have intermittent chest pain sharp-like in the center of the chest nonradiating, self-limited, used to happen only once a month or so.  For which he went to see cardiology 2 years ago and underwent CTA of coronary arteries which showed low calcium  score and patient has been followed up outpatient since.  At this time, the same chest pain became more frequent  10-15 times a day, similarly in the center of her chest nonradiating denied any associated symptoms of shortness of breath lightheadedness palpitations nauseous vomiting.  He described the pain as different from indigestion or kidney stone associated pain.  He even had several episodes at night which woke him up.  However he did not take any additional aspirin  or nitroglycerin  for it and for all occasions he just sit down or lie down flat episode past usually lasted for somewhere 3 to 6 minutes.  ED Course: Afebrile, nontachycardic blood pressure 160/90 utilization 98% room air.  Troponin negative x 2, EKG showed sinus rhythm, chronic RBBB, chronic nonspecific ST changes.  Cardiology consulted and patient was started on heparin  drip.  Review of Systems: As per HPI otherwise 14 point review of systems negative.    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   a.) cCTA 08/29/2022: asc Ao = 3.9 cm   Erectile dysfunction    a.) on PDE5i (sildenafil ) PRN   Essential (primary) hypertension 12/28/2015   Gastro-esophageal reflux disease without esophagitis 12/28/2015   History of kidney stones    Hypertension    Insomnia    a.) takes trazodone  PRN   Long term current use of aspirin     Mixed hyperlipidemia    Myocardial infarction (HCC)    possible small event EKG changes   OSA on CPAP    Osteoarthritis    RBBB (right bundle branch block)    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[facility])     Past Surgical History:  Procedure Laterality Date   CATARACT EXTRACTION W/PHACO Left 08/13/2023   Procedure: CATARACT EXTRACTION PHACO AND INTRAOCULAR LENS PLACEMENT (IOC) LEFT CLAREON VIVITY 4.78 00:36.5;  Surgeon: Mittie Gaskin, MD;  Location: [facility];  Service: Ophthalmology;  Laterality: Left;   CATARACT EXTRACTION W/PHACO Right 08/27/2023   Procedure: CATARACT EXTRACTION PHACO AND INTRAOCULAR LENS PLACEMENT (IOC) RIGHT DIABETIC CLAREON VIVITY TORIC 5.55 00:25.6;  Surgeon: Mittie Gaskin, MD;  Location: [facility];  Service: Ophthalmology;  Laterality: Right;   CHOLECYSTECTOMY     COLONOSCOPY  2003, 2005, 2008   COLONOSCOPY  10/23/2012   COLONOSCOPY  01/01/2018   COLONOSCOPY WITH PROPOFOL  N/A 12/12/2023   Procedure: COLONOSCOPY WITH PROPOFOL ;  Surgeon: Maryruth Ole DASEN, MD;  Location: [facility];  Service: Endoscopy;  Laterality: N/A;  PREFERS MID MORNING, IF POSSIBLE   CYSTOSCOPY W/ RETROGRADES Right 11/02/2019   Procedure: CYSTOSCOPY WITH RETROGRADE PYELOGRAM;  Surgeon: Twylla Glendia BROCKS, MD;   Location: [facility];  Service: Urology;  Laterality: Right;   CYSTOSCOPY W/ RETROGRADES N/A 01/08/2022   Procedure: CYSTOSCOPY WITH RETROGRADE PYELOGRAM/ BLADDER BIOPSY;  Surgeon: Twylla Glendia BROCKS, MD;  Location: [facility];  Service: Urology;  Laterality: N/A;   CYSTOSCOPY WITH BIOPSY N/A 11/02/2019   Procedure: CYSTOSCOPY WITH BIOPSY;  Surgeon: Twylla Glendia BROCKS, MD;  Location: [facility];  Service: Urology;  Laterality: N/A;   CYSTOSCOPY/RETROGRADE/URETEROSCOPY/STONE EXTRACTION WITH BASKET Right 01/08/2022   Procedure: CYSTOSCOPY/RETROGRADE/URETEROSCOPY/STONE EXTRACTION WITH BASKET;  Surgeon: Twylla Glendia BROCKS, MD;  Location: [facility];  Service: Urology;  Laterality: Right;   CYSTOSCOPY/URETEROSCOPY/HOLMIUM LASER/STENT PLACEMENT Right 12/29/2015   Procedure: CYSTOSCOPY/URETEROSCOPY/ with basketing of stone, right ureteral /STENT PLACEMENT;  Surgeon: Ricardo Likens, MD;  Location: [facility];  Service: Urology;  Laterality: Right;   CYSTOSCOPY/URETEROSCOPY/HOLMIUM LASER/STENT PLACEMENT Right 11/02/2019   Procedure: CYSTOSCOPY/URETEROSCOPY/HOLMIUM LASER/STENT PLACEMENT;  Surgeon: Twylla Glendia BROCKS, MD;  Location: [facility];  Service: Urology;  Laterality: Right;   CYSTOSCOPY/URETEROSCOPY/HOLMIUM LASER/STENT PLACEMENT Right 01/08/2022   Procedure: CYSTOSCOPY/URETEROSCOPY/HOLMIUM LASER/STENT PLACEMENT;  Surgeon: Twylla Glendia BROCKS, MD;  Location: [facility];  Service: Urology;  Laterality: Right;   ERCP  06/15/2003   ESOPHAGOGASTRODUODENOSCOPY     2003, 2004   EYE SURGERY     JOINT REPLACEMENT     TONSILLECTOMY  1957   TOTAL HIP ARTHROPLASTY Right 07/17/2023   Procedure: TOTAL HIP ARTHROPLASTY ANTERIOR APPROACH;  Surgeon: Lorelle Hussar, MD;  Location: [facility];  Service: Orthopedics;  Laterality: Right;   UPPER ESOPHAGEAL ENDOSCOPIC ULTRASOUND (EUS)  07/06/2003     reports that he has never smoked. He has never been exposed to tobacco smoke. He has never used smokeless tobacco. He reports current alcohol  use of about 1.0 standard drink of alcohol per week. He reports that he does not use drugs.  Allergies Allergen Reactions   Glucosamine-Chondroitin Swelling    Tongue swelling   Penicillins Rash and Other (See Comments)    Childhood allergy.      Family History  Problem Relation Age of Onset   Stroke Mother    AAA (abdominal aortic aneurysm) Father    Hypertension Father    Bladder Cancer Neg Hx    Kidney cancer Neg Hx    Prostate cancer Neg Hx      Prior to Admission medications  Medication Sig Start Date End Date Taking? Authorizing Provider  aspirin  EC 81 MG tablet Take 81 mg by mouth daily.   Yes [provider]  atorvastatin  (LIPITOR) 80 MG tablet Take 80 mg by mouth at bedtime. 06/11/22  Yes [provider]  Cholecalciferol  25 MCG (1000 UT) capsule Take 1,000 Units by mouth daily.   Yes [provider]  hydrochlorothiazide  (HYDRODIURIL ) 25 MG tablet Take 25 mg by mouth daily.   Yes [provider]  lisinopril  (PRINIVIL ,ZESTRIL ) 40 MG tablet Take 40 mg by mouth daily.   Yes [provider]  Multiple Vitamins-Minerals (MULTIVITAMIN WITH MINERALS) tablet Take 1 tablet by mouth daily.   Yes [provider]  Omega-3 Fatty Acids (FISH OIL ) 1000 MG CAPS Take 1,000 mg by  mouth daily.   Yes [provider]  omeprazole (PRILOSEC) 20 MG capsule Take 20 mg by mouth daily.   Yes [provider]  sildenafil  (REVATIO ) 20 MG tablet Take 20 mg by mouth daily as needed (ED).   Yes [provider]  traZODone  (DESYREL ) 150 MG tablet Take 75 mg by mouth at bedtime.   Yes [provider]    Physical Exam: Vitals:   10/14/24 1331 10/14/24 1333 10/14/24 1406  BP:  (!) 162/90   Pulse:  74   Resp:  16   Temp:  97.8 F (36.6 C)   TempSrc:  Oral   SpO2:  96% 97%  Weight: 88.5 kg    Height: 6' (1.829 m)      Constitutional: NAD, calm, comfortable Vitals:   10/14/24 1331 10/14/24 1333 10/14/24 1406   BP:  (!) 162/90   Pulse:  74   Resp:  16   Temp:  97.8 F (36.6 C)   TempSrc:  Oral   SpO2:  96% 97%  Weight: 88.5 kg    Height: 6' (1.829 m)     Eyes: PERRL, lids and conjunctivae normal ENMT: Mucous membranes are moist. Posterior pharynx clear of any exudate or lesions.Normal dentition.  Neck: normal, supple, no masses, no thyromegaly Respiratory: clear to auscultation bilaterally, no wheezing, no crackles. Normal respiratory effort. No accessory muscle use.  Cardiovascular: Regular rate and rhythm, no murmurs / rubs / gallops. No extremity edema. 2+ pedal pulses. No carotid bruits.  Abdomen: no tenderness, no masses palpated. No hepatosplenomegaly. Bowel sounds positive.  Musculoskeletal: no clubbing / cyanosis. No joint deformity upper and lower extremities. Good ROM, no contractures. Normal muscle tone.  Skin: no rashes, lesions, ulcers. No induration Neurologic: CN 2-12 grossly intact. Sensation intact, DTR normal. Strength 5/5 in all 4.  Psychiatric: Normal judgment and insight. Alert and oriented x 3. Normal mood.    Labs on Admission: I have personally reviewed following labs and imaging studies  CBC: Recent Labs  Lab 10/14/24 1335  WBC 6.3  HGB 14.5  HCT 42.3  MCV 86.9  PLT 169   Basic Metabolic Panel: Recent Labs  Lab 10/14/24 1335  NA 138  K 3.9  CL 102  CO2 24  GLUCOSE 129*  BUN 26*  CREATININE 1.26*  CALCIUM  9.7   GFR: Estimated Creatinine Clearance: 53 mL/min (A) (by C-G formula based on SCr of 1.26 mg/dL (H)). Liver Function Tests: No results for input(s): AST, ALT, ALKPHOS, BILITOT, PROT, ALBUMIN in the last 168 hours. No results for input(s): LIPASE, AMYLASE in the last 168 hours. No results for input(s): AMMONIA in the last 168 hours. Coagulation Profile: Recent Labs  Lab 10/14/24 1449  INR 1.0   Cardiac Enzymes: No results for input(s): CKTOTAL, CKMB, CKMBINDEX, TROPONINI in the last 168 hours. BNP (last 3  results) No results for input(s): PROBNP in the last 8760 hours. HbA1C: No results for input(s): HGBA1C in the last 72 hours. CBG: No results for input(s): GLUCAP in the last 168 hours. Lipid Profile: No results for input(s): CHOL, HDL, LDLCALC, TRIG, CHOLHDL, LDLDIRECT in the last 72 hours. Thyroid  Function Tests: No results for input(s): TSH, T4TOTAL, FREET4, T3FREE, THYROIDAB in the last 72 hours. Anemia Panel: No results for input(s): VITAMINB12, FOLATE, FERRITIN, TIBC, IRON, RETICCTPCT in the last 72 hours. Urine analysis:    Component Value Date/Time   COLORURINE YELLOW (A) 07/08/2023 1050   APPEARANCEUR Clear 08/13/2024 0759   LABSPEC 1.013 07/08/2023 1050  PHURINE 6.0 07/08/2023 1050   GLUCOSEU Negative 08/13/2024 0759   HGBUR NEGATIVE 07/08/2023 1050   BILIRUBINUR Negative 08/13/2024 0759   KETONESUR NEGATIVE 07/08/2023 1050   PROTEINUR Negative 08/13/2024 0759   PROTEINUR NEGATIVE 07/08/2023 1050   NITRITE Negative 08/13/2024 0759   NITRITE NEGATIVE 07/08/2023 1050   LEUKOCYTESUR Negative 08/13/2024 0759   LEUKOCYTESUR NEGATIVE 07/08/2023 1050    Radiological Exams on Admission: DG Chest 2 View Result Date: 10/14/2024 CLINICAL DATA:  Chest pain. EXAM: CHEST - 2 VIEW COMPARISON:  None Available. FINDINGS: The heart size and mediastinal contours are within normal limits. Both lungs are clear. The visualized skeletal structures are unremarkable. IMPRESSION: No active cardiopulmonary disease. Electronically Signed   By: Suzen Dials M.D.   On: 10/14/2024 13:54    EKG: Sinus rhythm, nonspecific ST changes on multiple leads.  Assessment/Plan Principal Problem:   Chest pain Active Problems:   Unstable angina (HCC)  (please populate well all problems here in Problem List. (For example, if patient is on BP meds at home and you resume or decide to hold them, it is a problem that needs to be her. Same for CAD, COPD, HLD and so  on)  Unstable angina - Change of frequency and duration of chest pain with concern about unstable angina given the history of known obstructive CAD show on CTA coronary artery 2 years ago. - Agreed with ACS medication including heparin  drip - Continue aspirin  and statin - LHC tomorrow - Risk factor modification, check lipid panel.  CKD II - IV hydration x 10 hours for kidney protection for the incoming IV contrast tomorrow.  HTN, uncontrolled - Resume home BP meds  DVT prophylaxis: Heparin  drip Code Status: Full code Family Communication: None at bedside Disposition Plan: Expect less than 2 midnight hospital stay Consults called: Cardiology Admission status: PCU observation   Cort ONEIDA Mana MD [facility] Pager [phone]  10/14/2024, 4:19 PM    "

## 2024-10-14 NOTE — Consult Note (Signed)
 "  Cardiology Consultation:   Patient ID: Andre Stone; [id number]; [birth year]   Admit date: 10/14/2024 Date of Consult: 10/14/2024  Primary Care Provider: Alla Amis, MD Primary Cardiologist: Hochrein Primary Electrophysiologist:  None   Patient Profile:   Andre Stone is a 80 y.o. male with a hx of CAD, HTN, HLD, RBBB, and OSA on CPAP who is being seen today for the evaluation of unstable angina at the request of Dr. Arlander.  History of Present Illness:   Andre Stone underwent prior stress test in 2019 for evaluation of chest discomfort that showed no ischemia with preserved LV systolic function.  In the setting of intermittent chest pain he underwent coronary calcium  score with his PCP in 05/2022 that showed a score of 937 which was the 77th percentile.  In this setting he underwent coronary CTA in 08/2022 that showed a calcium  score of 937 with less than 25% stenosis in the distal left main 50 to 69% stenosis in the proximal LAD, 25 to 49%, and stenosis in the proximal RCA.  CT FFR showed no significant stenosis throughout the epicardial arteries.  Noncardiac overread was notable for ectatic ascending thoracic aorta measuring up to 3.9 cm.  He was last seen by cardiology for routine follow-up in 02/2024 and was doing well from a cardiac perspective, noting rare sporadic episodes of chest discomfort that were unchanged from prior.  Over the years, he has reported randomly occurring intermittent episodes of sharp chest discomfort without radiation that will typically last for 3 to 6 minutes and spontaneously resolve.  He presented to [facility] ED on 10/14/2024 with and increase in left-sided sharp chest discomfort when compared to his baseline.  Again, these episodes continue to be randomly occurring and will last for 3 to 6 minutes.  No radiation or associated symptoms.  Over the past 24 hours he believes he may have had approximately 15-20 episodes like this prompting ER  evaluation.  Initial BP 162/90, heart rate 74 bpm, oxygen saturation 96% on room air, and afebrile.  EKG showed NSR, 73 bpm, RBBB with nonspecific ST-T changes.  Initial high-sensitivity troponin 10 with delta troponin pending.  CBC unremarkable.  Potassium 3.9, BUN 26, serum creatinine 1.26.  Chest x-ray without active cardiopulmonary disease.  At initial time of cardiology consult he was without symptoms of chest pain.  During MD consultation he developed pinpoint left-sided sharp chest discomfort that lasted for several minutes and spontaneously resolved.    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   a.) cCTA 08/29/2022: asc Ao = 3.9 cm   Erectile dysfunction    a.) on PDE5i (sildenafil ) PRN   Essential (primary) hypertension 12/28/2015   Gastro-esophageal reflux disease without esophagitis 12/28/2015   History of kidney stones    Hypertension    Insomnia    a.) takes trazodone  PRN   Long term current use of aspirin     Mixed hyperlipidemia    Myocardial infarction (HCC)    possible small event EKG changes  OSA on CPAP    Osteoarthritis    RBBB (right bundle branch block)    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[facility])     Past Surgical History:  Procedure Laterality Date   CATARACT EXTRACTION W/PHACO Left 08/13/2023   Procedure: CATARACT EXTRACTION PHACO AND INTRAOCULAR LENS PLACEMENT (IOC) LEFT CLAREON VIVITY 4.78 00:36.5;  Surgeon: Mittie Gaskin, MD;  Location: [facility];  Service: Ophthalmology;   Laterality: Left;   CATARACT EXTRACTION W/PHACO Right 08/27/2023   Procedure: CATARACT EXTRACTION PHACO AND INTRAOCULAR LENS PLACEMENT (IOC) RIGHT DIABETIC CLAREON VIVITY TORIC 5.55 00:25.6;  Surgeon: Mittie Gaskin, MD;  Location: [facility];  Service: Ophthalmology;  Laterality: Right;   CHOLECYSTECTOMY     COLONOSCOPY     2003, 2005, 2008   COLONOSCOPY  10/23/2012   COLONOSCOPY  01/01/2018   COLONOSCOPY WITH PROPOFOL  N/A 12/12/2023   Procedure: COLONOSCOPY WITH PROPOFOL ;  Surgeon: Maryruth Ole DASEN, MD;  Location: [facility] ENDOSCOPY;  Service: Endoscopy;  Laterality: N/A;  PREFERS MID MORNING, IF POSSIBLE   CYSTOSCOPY W/ RETROGRADES Right 11/02/2019   Procedure: CYSTOSCOPY WITH RETROGRADE PYELOGRAM;  Surgeon: Twylla Glendia BROCKS, MD;  Location: [facility] ORS;  Service: Urology;  Laterality: Right;   CYSTOSCOPY W/ RETROGRADES N/A 01/08/2022   Procedure: CYSTOSCOPY WITH RETROGRADE PYELOGRAM/ BLADDER BIOPSY;  Surgeon: Twylla Glendia BROCKS, MD;  Location: [facility] ORS;  Service: Urology;  Laterality: N/A;   CYSTOSCOPY WITH BIOPSY N/A 11/02/2019   Procedure: CYSTOSCOPY WITH BIOPSY;  Surgeon: Twylla Glendia BROCKS, MD;  Location: [facility] ORS;  Service: Urology;  Laterality: N/A;   CYSTOSCOPY/RETROGRADE/URETEROSCOPY/STONE EXTRACTION WITH BASKET Right 01/08/2022   Procedure: CYSTOSCOPY/RETROGRADE/URETEROSCOPY/STONE EXTRACTION WITH BASKET;  Surgeon: Twylla Glendia BROCKS, MD;  Location: [facility] ORS;  Service: Urology;  Laterality: Right;   CYSTOSCOPY/URETEROSCOPY/HOLMIUM LASER/STENT PLACEMENT Right 12/29/2015   Procedure: CYSTOSCOPY/URETEROSCOPY/ with basketing of stone, right ureteral /STENT PLACEMENT;  Surgeon: Ricardo Likens, MD;  Location: [facility] ORS;  Service: Urology;  Laterality: Right;   CYSTOSCOPY/URETEROSCOPY/HOLMIUM LASER/STENT PLACEMENT Right 11/02/2019   Procedure: CYSTOSCOPY/URETEROSCOPY/HOLMIUM LASER/STENT PLACEMENT;  Surgeon: Twylla Glendia BROCKS, MD;  Location: [facility] ORS;  Service: Urology;  Laterality: Right;    CYSTOSCOPY/URETEROSCOPY/HOLMIUM LASER/STENT PLACEMENT Right 01/08/2022   Procedure: CYSTOSCOPY/URETEROSCOPY/HOLMIUM LASER/STENT PLACEMENT;  Surgeon: Twylla Glendia BROCKS, MD;  Location: [facility] OR[facility];  Service: Urology;  Laterality: Right;   ERCP  06/15/2003   ESOPHAGOGASTRODUODENOSCOPY     2003, 2004   EYE SURGERY     JOINT REPLACEMENT     TONSILLECTOMY  1957   TOTAL HIP ARTHROPLASTY Right 07/17/2023   Procedure: TOTAL HIP ARTHROPLASTY ANTERIOR APPROACH;  Surgeon: Lorelle Hussar, MD;  Location: [facility] ORS;  Service: Orthopedics;  Laterality: Right;   UPPER ESOPHAGEAL ENDOSCOPIC ULTRASOUND (EUS)  07/06/2003     Home Meds: Prior to Admission medications  Medication Sig Start Date End Date Taking? Authorizing Provider  aspirin  EC 81 MG tablet Take 81 mg by mouth daily.    [provider]  atorvastatin  (LIPITOR) 80 MG tablet Take 80 mg by mouth at bedtime. 06/11/22   [provider]  Cholecalciferol  25 MCG (1000 UT) capsule Take 1,000 Units by mouth daily.    [provider]  hydrochlorothiazide  (HYDRODIURIL ) 25 MG tablet Take 25 mg by mouth daily.    [provider]  lisinopril  (PRINIVIL ,ZESTRIL ) 40 MG tablet Take 40 mg by mouth daily.    [provider]  Multiple Vitamins-Minerals (MULTIVITAMIN WITH MINERALS) tablet Take 1 tablet by mouth daily.    [provider]  Omega-3 Fatty Acids (FISH OIL ) 1000 MG CAPS  Take 1,000 mg by mouth daily.    [provider]  omeprazole (PRILOSEC) 20 MG capsule Take 20 mg by mouth daily.    [provider]  sildenafil  (REVATIO ) 20 MG tablet Take 20 mg by mouth daily as needed (ED).    [provider]  traZODone  (DESYREL ) 150 MG tablet Take 75 mg by mouth at bedtime.    [provider]    Inpatient Medications: Scheduled Meds:  heparin   4,000 Units Intravenous Once   Continuous Infusions:  heparin      PRN Meds:   Allergies:   Allergies  Allergen Reactions    Glucosamine-Chondroitin Swelling    Tongue swelling   Penicillins Rash and Other (See Comments)    Childhood allergy.  Has patient had a PCN reaction causing immediate rash, facial/tongue/throat swelling, SOB or lightheadedness with hypotension:  Has patient had a PCN reaction causing severe rash involving mucus membranes or skin necrosis:  Has patient had a PCN reaction that required hospitalization  Has patient had a PCN reaction occurring within the last 10 years:  If all of the above answers are NO, then may proceed with Cephalosporin use.     Social History:   Social History   Socioeconomic History   Marital status: Married    Spouse name: Gaynelle   Number of children: Not on file   Years of education: Not on file   Highest education level: Not on file  Occupational History   Occupation: works as Education Administrator in [place]  Tobacco Use   Smoking status: Never    Passive exposure: Never   Smokeless tobacco: Never  Vaping Use   Vaping status: Never Used  Substance and Sexual Activity   Alcohol use: Yes    Alcohol/week: 1.0 standard drink of alcohol    Types: 1 Shots of liquor per week    Comment: regular, several times per week, liquor   Drug use: No   Sexual activity: Yes  Other Topics Concern   Not on file  Social History Narrative   Two daughters.  Lives with wife.  Retired superior court judge.    Social Drivers of Health   Tobacco Use: Low Risk (10/14/2024)   Patient History    Smoking Tobacco Use: Never    Smokeless Tobacco Use: Never    Passive Exposure: Never  Financial Resource Strain: Low Risk  (05/30/2024)   Received from [facility]   Overall Financial Resource Strain (CARDIA)    Difficulty of Paying Living Expenses: Not hard at all  Food Insecurity: No Food Insecurity (05/30/2024)   Received from [facility]   Epic    Within the past 12 months, you worried that your food would run out before you got the money to  buy more.: Never true    Within the past 12 months, the food you bought just didn't last and you didn't have money to get more.: Never true  Transportation Needs: No Transportation Needs (05/30/2024)   Received from [facility] - Transportation    In the past 12 months, has lack of transportation kept you from medical appointments or from getting medications?: No    Lack of Transportation (Non-Medical): No  Physical Activity: Not on file  Stress: Not on file  Social Connections: Not on file  Intimate Partner Violence: Not At Risk (07/17/2023)   Humiliation, Afraid, Rape, and Kick questionnaire    Fear of Current or Ex-Partner: No  Emotionally Abused: No    Physically Abused: No    Sexually Abused: No  Depression (PHQ2-9): Not on file  Alcohol Screen: Not on file  Housing: Low Risk  (05/30/2024)   Received from [facility]   Epic    In the last 12 months, was there a time when you were not able to pay the mortgage or rent on time?: No    In the past 12 months, how many times have you moved where you were living?: 0    At any time in the past 12 months, were you homeless or living in a shelter (including now)?: No  Utilities: Not At Risk (05/30/2024)   Received from [facility]   Epic    In the past 12 months has the electric, gas, oil, or water  company threatened to shut off services in your home?: No  Health Literacy: Not on file     Family History:   Family History  Problem Relation Age of Onset   Stroke Mother    AAA (abdominal aortic aneurysm) Father    Hypertension Father    Bladder Cancer Neg Hx    Kidney cancer Neg Hx    Prostate cancer Neg Hx     ROS:  Review of Systems  Constitutional:  Negative for chills, diaphoresis, fever, malaise/fatigue and weight loss.  HENT:  Negative for congestion.   Eyes:  Negative for discharge and redness.  Respiratory:  Negative for cough, sputum production, shortness of  breath and wheezing.   Cardiovascular:  Positive for chest pain. Negative for palpitations, orthopnea, claudication, leg swelling and PND.  Gastrointestinal:  Negative for abdominal pain, heartburn, nausea and vomiting.  Musculoskeletal:  Negative for falls and myalgias.  Skin:  Negative for rash.  Neurological:  Negative for dizziness, tingling, tremors, sensory change, speech change, focal weakness, loss of consciousness and weakness.  Endo/Heme/Allergies:  Does not bruise/bleed easily.  Psychiatric/Behavioral:  Negative for substance abuse. The patient is not nervous/anxious.   All other systems reviewed and are negative.     Physical Exam/Data:   Vitals:   10/14/24 1331 10/14/24 1333 10/14/24 1406  BP:  (!) 162/90   Pulse:  74   Resp:  16   Temp:  97.8 F (36.6 C)   TempSrc:  Oral   SpO2:  96% 97%  Weight: 88.5 kg    Height: 6' (1.829 m)     No intake or output data in the 24 hours ending 10/14/24 1544 Filed Weights   10/14/24 1331  Weight: 88.5 kg   Body mass index is 26.45 kg/m.   Physical Exam (as per MD evaluation): General: Well developed, well nourished, in no acute distress. Head: Normocephalic, atraumatic, sclera non-icteric, no xanthomas, nares without discharge.  Neck: Negative for carotid bruits. JVD not elevated. Lungs: Clear bilaterally to auscultation without wheezes, rales, or rhonchi. Breathing is unlabored. Heart: RRR with S1 S2. No murmurs, rubs, or gallops appreciated. Abdomen: Soft, non-tender, non-distended with normoactive bowel sounds. No hepatomegaly. No rebound/guarding. No obvious abdominal masses. Msk:  Strength and tone appear normal for age. Extremities: No clubbing or cyanosis. No edema. Distal pedal pulses are 2+ and equal bilaterally. Neuro: Alert and oriented X 3. No facial asymmetry. No focal deficit. Moves all extremities spontaneously. Psych:  Responds to questions appropriately with a normal affect.   EKG:  The EKG was personally  reviewed and demonstrates: NSR, 73 bpm, RBBB with nonspecific ST-T changes Telemetry:  Telemetry was personally reviewed  and demonstrates: Not currently on tele  Weights: Filed Weights   10/14/24 1331  Weight: 88.5 kg    Relevant CV Studies:  Coronary CTA 08/29/2022: Aorta: Normal size. Mild aortic root and descending aorta calcifications. No dissection.   Aortic Valve:  Trileaflet.  No calcifications.   Coronary Arteries:  Normal coronary origin.  Right dominance.   RCA is a large dominant artery that gives rise to PDA and PLA. There is calcified plaque in the proximal and distal RCA causing mild stenosis (25-49%).   Left main gives rise to LAD and LCX arteries. Calcified plaque in the distal LM causing minimal stenosis (<25%).   LAD has calcified and non calcified plaque proximally causing moderate stenosis (50-69%)   LCX is a non-dominant artery that gives rise to one large OM1 branch. There is calcified plaque in the proximal LCx causing minimal stenosis (<25%).   Other findings:   Normal pulmonary vein drainage into the left atrium.   Normal left atrial appendage without a thrombus.   Normal size of the pulmonary artery.   IMPRESSION: 1. Coronary calcium  score previously calculated 937. 2. Normal coronary origin with right dominance. 3. Moderate proximal LAD stenosis (50-69%). 4. Mild proxima RCA stenosis (25-49%). 5. Minimal stenosis in the distal LM, LCx (<25%).  ctFFR: 1. Left Main:  No significant stenosis. 2. LAD: No significant stenosis.  FFRct 0.85 3. LCX: No significant stenosis.  FFRct 0.97 4. RCA: No significant stenosis.  FFRct 0.94   IMPRESSION: 1.  CT FFR analysis didn't show any significant stenosis. __________  Calcium  score 06/07/2022: Left Main: 0   LAD: 796   LCx: 35.2   RCA: 105   Total Agatston Score: 937   MESA database percentile: 77   AORTA MEASUREMENTS:   Ascending Aorta: 4.2 cm   Descending Aorta:2.6 cm   OTHER  FINDINGS:   Cardiovascular: Aortic atherosclerosis and 3 vessel coronary artery disease. Normal heart size. No pericardial effusion.   Mediastinum/Nodes: No adenopathy or acute process in the mediastinum.   Lungs/Pleura: Lungs are clear and airways are patent to the extent evaluated.   Upper Abdomen: No acute findings in the upper abdomen.   Musculoskeletal: No acute bone finding. No destructive bone process. Spinal degenerative changes. Degenerative changes are mild-to-moderate.   IMPRESSION: 1. Coronary artery calcium  score of 937. [place] database percentile: 68. 2. Aortic atherosclerosis and 3 vessel coronary artery disease.    Laboratory Data:  Chemistry Recent Labs  Lab 10/14/24 1335  NA 138  K 3.9  CL 102  CO2 24  GLUCOSE 129*  BUN 26*  CREATININE 1.26*  CALCIUM  9.7  GFRNONAA 58*  ANIONGAP 12    No results for input(s): PROT, ALBUMIN, AST, ALT, ALKPHOS, BILITOT in the last 168 hours. Hematology Recent Labs  Lab 10/14/24 1335  WBC 6.3  RBC 4.87  HGB 14.5  HCT 42.3  MCV 86.9  MCH 29.8  MCHC 34.3  RDW 13.3  PLT 169   Cardiac EnzymesNo results for input(s): TROPONINI in the last 168 hours. No results for input(s): TROPIPOC in the last 168 hours.  BNPNo results for input(s): BNP, PROBNP in the last 168 hours.  DDimer No results for input(s): DDIMER in the last 168 hours.  Radiology/Studies:  DG Chest 2 View Result Date: 10/14/2024 IMPRESSION: No active cardiopulmonary disease. Electronically Signed   By: Suzen Dials M.D.   On: 10/14/2024 13:54    Assessment and Plan:   1. CAD involving the native coronary arteries with unstable  angina: - Currently, without symptoms of angina or cardiac decompensation - He reports an increase in sporadic left-sided chest discomfort over the past 24 hours from baseline - Coronary CTA in 08/2022 demonstrated moderate nonobstructive LAD stenosis estimated at 50 to 69% that was not  hemodynamically significant by CT FFR - Initial high-sensitivity troponin negative with delta troponin pending - Given concern for unstable angina the patient has been started on heparin  drip - N.p.o. at midnight - Plan for LHC on 10/15/2024 - Obtain echo - ASA 81 mg daily - PTA atorvastatin  80 mg - LP(a) pending  2.  HTN: - Blood pressure 162/90 upon presentation to the ED - Trend with recommendation to escalate antihypertensive pharmacotherapy as indicated - With intermittent renal dysfunction noted on labs, may need to consider transitioning HCTZ to alternative antihypertensive medication - PTA lisinopril  40 mg  3.  HLD: - LDL 46 in 05/2024 - PTA atorvastatin  80 mg  4.  Renal dysfunction: - Patient intermittently noted to have elevations in serum creatinine with GFR typically in the 50s - Monitor   5.  Hyperglycemia: - A1c 6.0 in 05/2024   Informed Consent   Shared Decision Making/Informed Consent{  The risks [stroke (1 in 1000), death (1 in 1000), kidney failure [usually temporary] (1 in 500), bleeding (1 in 200), allergic reaction [possibly serious] (1 in 200)], benefits (diagnostic support and management of coronary artery disease) and alternatives of a cardiac catheterization were discussed in detail with Andre Stone and he is willing to proceed.       For questions or updates, please contact [facility] Please consult [email or web address] for contact info under Cardiology/STEMI.   Signed, Bernardino Bring, PA-C [facility] Pager: [phone] 10/14/2024, 3:44 PM    "

## 2024-10-14 NOTE — Telephone Encounter (Signed)
 Patient seen in ED today for unstable angina, heart cath scheduled for tomorrow

## 2024-10-14 NOTE — Consult Note (Signed)
 Pharmacy Consult Note - Anticoagulation  Pharmacy Consult for heparin  infusion Indication: chest pain/ACS   PATIENT MEASUREMENTS: Height: 6' (182.9 cm) Weight: 88.5 kg (195 lb) IBW/kg (Calculated) : 77.6 HEPARIN  DW (KG): 88.5  VITAL SIGNS: Temp: 97.8 F (36.6 C) (01/22 1333) Temp Source: Oral (01/22 1333) BP: 162/90 (01/22 1333) Pulse Rate: 74 (01/22 1333)  Recent Labs    10/14/24 1335  HGB 14.5  HCT 42.3  PLT 169  CREATININE 1.26*    Estimated Creatinine Clearance: 53 mL/min (A) (by C-G formula based on SCr of 1.26 mg/dL (H)).  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   a.) cCTA 08/29/2022: asc Ao = 3.9 cm   Erectile dysfunction    a.) on PDE5i (sildenafil ) PRN   Essential (primary) hypertension 12/28/2015   Gastro-esophageal reflux disease without esophagitis 12/28/2015   History of kidney stones    Hypertension    Insomnia    a.) takes trazodone  PRN   Long term current use of aspirin     Mixed hyperlipidemia    Myocardial infarction (HCC)    possible small event EKG changes   OSA on CPAP    Osteoarthritis    RBBB (right bundle branch block)    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(HCC)     Medications:  (Not in a hospital admission)  Scheduled:   heparin   4,000 Units Intravenous Once   Infusions:   heparin        ASSESSMENT: 79 y.o. male with PMH HTN and arthritis is presenting with chest pain. Per MD Arlander, will treat with heparin  infusion for unstable angina. Patient  is not on chronic anticoagulation per chart review. Pharmacy has been consulted to initiate and manage heparin  intravenous infusion.   Goal(s) of therapy: Heparin  level 0.3 - 0.7 units/mL aPTT 66 - 102 seconds Monitor platelets by anticoagulation protocol: Yes   Baseline anticoagulation labs: Recent Labs    10/14/24 1335  HGB 14.5  PLT 169   Baseline INR amd aPTT pending   PLAN:  Give 4,000 units bolus x1; then start heparin  infusion at 1,150 units/hour.  Check heparin  level in 8 hours, then daily once at least two levels are consecutively therapeutic.  Monitor CBC daily while on heparin  infusion.  Annabella LOISE Banks, PharmD Clinical Pharmacist 10/14/2024 2:47 PM

## 2024-10-14 NOTE — ED Provider Notes (Signed)
 "  [facility] Provider Note    Event Date/Time   First MD Initiated Contact with Patient 10/14/24 1403     (approximate)   History   Chest Pain   HPI  Andre Stone is a 79 y.o. male with a history of CAD, diabetes, hypertension, mixed hyperlipidemia who presents with complaints of chest pain.  Patient reports multiple brief episodes of chest pressure moderate to severe over the last 24 hours, he reports at least 15-20 episodes.  Review of records demonstrates he sees Dr. Lavona of cardiology, follows with them annually.  Has had a calcium  scoring study, 77th percentile.     Physical Exam   Triage Vital Signs: ED Triage Vitals  Encounter Vitals Group     BP 10/14/24 1333 (!) 162/90     Girls Systolic BP Percentile --      Girls Diastolic BP Percentile --      Boys Systolic BP Percentile --      Boys Diastolic BP Percentile --      Pulse Rate 10/14/24 1333 74     Resp 10/14/24 1333 16     Temp 10/14/24 1333 97.8 F (36.6 C)     Temp Source 10/14/24 1333 Oral     SpO2 10/14/24 1333 96 %     Weight 10/14/24 1331 88.5 kg (195 lb)     Height 10/14/24 1331 1.829 m (6')     Head Circumference --      Peak Flow --      Pain Score 10/14/24 1330 0     Pain Loc --      Pain Education --      Exclude from Growth Chart --     Most recent vital signs: Vitals:   10/14/24 1333  BP: (!) 162/90  Pulse: 74  Resp: 16  Temp: 97.8 F (36.6 C)  SpO2: 96%     General: Awake, no distress. Pleasant and interactive CV:  Good peripheral perfusion.  Resp:  Normal effort.  Abd:  No distention.  Other:     ED Results / Procedures / Treatments   Labs (all labs ordered are listed, but only abnormal results are displayed) Labs Reviewed  BASIC METABOLIC PANEL WITH GFR - Abnormal; Notable for the following components:      Result Value   Glucose, Bld 129 (*)    BUN 26 (*)    Creatinine, Ser 1.26 (*)    GFR, Estimated 58 (*)    All other components  within normal limits  CBC  APTT  PROTIME-INR  HEPARIN  LEVEL (UNFRACTIONATED)  TROPONIN T, HIGH SENSITIVITY  TROPONIN T, HIGH SENSITIVITY     EKG  ED ECG REPORT I, Lamar Price, the attending physician, personally viewed and interpreted this ECG.  Date: 10/14/2024  Rhythm: normal sinus rhythm QRS Axis: normal Intervals: normal ST/T Wave abnormalities: normal Narrative Interpretation: no evidence of acute ischemia    RADIOLOGY Chest x-ray without acute abnormality    PROCEDURES:  Critical Care performed: yes  CRITICAL CARE Performed by: Lamar Price   Total critical care time: 30 minutes  Critical care time was exclusive of separately billable procedures and treating other patients.  Critical care was necessary to treat or prevent imminent or life-threatening deterioration.  Critical care was time spent personally by me on the following activities: development of treatment plan with patient and/or surrogate as well as nursing, discussions with consultants, evaluation of patient's response to treatment, examination of patient, obtaining history  from patient or surrogate, ordering and performing treatments and interventions, ordering and review of laboratory studies, ordering and review of radiographic studies, pulse oximetry and re-evaluation of patient's condition.   Procedures   MEDICATIONS ORDERED IN ED: Medications  heparin  bolus via infusion 4,000 Units (has no administration in time range)  heparin  ADULT infusion 100 units/mL (25000 units/250mL) (has no administration in time range)     IMPRESSION / MDM / ASSESSMENT AND PLAN / ED COURSE  I reviewed the triage vital signs and the nursing notes. Patient's presentation is most consistent with acute presentation with potential threat to life or bodily function.  Patient presents with concerning chest pain in the setting of multiple comorbidities and known calcium  scoring study in the 77th  percentile.  Presentation consistent with possible unstable angina, less likely ACS, doubt musculoskeletal cause.  Chest x-ray overall reassuring  EKG not significant change from prior, high sensitive troponin is reassuring  Discussed with Dr. Perla of cardiology, agrees with heparin  gtt., likely catheterization tomorrow  Discussed with hospitalist for admission        FINAL CLINICAL IMPRESSION(S) / ED DIAGNOSES   Final diagnoses:  Unstable angina (HCC)     Rx / DC Orders   ED Discharge Orders     None        Note:  This document was prepared using Dragon voice recognition software and may include unintentional dictation errors.   Arlander Charleston, MD 10/14/24 1507  "

## 2024-10-14 NOTE — H&P (View-Only) (Signed)
 "  Cardiology Consultation:   Patient ID: Andre Stone; [id number]; 1946-06-11   Admit date: 10/14/2024 Date of Consult: 10/14/2024  Primary Care Provider: Alla Amis, MD Primary Cardiologist: Hochrein Primary Electrophysiologist:  None   Patient Profile:   Andre Stone is a 79 y.o. male with a hx of CAD, HTN, HLD, RBBB, and OSA on CPAP who is being seen today for the evaluation of unstable angina at the request of Dr. Arlander.  History of Present Illness:   Andre Stone underwent prior stress test in 2019 for evaluation of chest discomfort that showed no ischemia with preserved LV systolic function.  In the setting of intermittent chest pain he underwent coronary calcium  score with his PCP in 05/2022 that showed a score of 937 which was the 77th percentile.  In this setting he underwent coronary CTA in 08/2022 that showed a calcium  score of 937 with less than 25% stenosis in the distal left main 50 to 69% stenosis in the proximal LAD, 25 to 49%, and stenosis in the proximal RCA.  CT FFR showed no significant stenosis throughout the epicardial arteries.  Noncardiac overread was notable for ectatic ascending thoracic aorta measuring up to 3.9 cm.  He was last seen by cardiology for routine follow-up in 02/2024 and was doing well from a cardiac perspective, noting rare sporadic episodes of chest discomfort that were unchanged from prior.  Over the years, he has reported randomly occurring intermittent episodes of sharp chest discomfort without radiation that will typically last for 3 to 6 minutes and spontaneously resolve.  He presented to [facility] ED on 10/14/2024 with and increase in left-sided sharp chest discomfort when compared to his baseline.  Again, these episodes continue to be randomly occurring and will last for 3 to 6 minutes.  No radiation or associated symptoms.  Over the past 24 hours he believes he may have had approximately 15-20 episodes like this prompting ER  evaluation.  Initial BP 162/90, heart rate 74 bpm, oxygen saturation 96% on room air, and afebrile.  EKG showed NSR, 73 bpm, RBBB with nonspecific ST-T changes.  Initial high-sensitivity troponin 10 with delta troponin pending.  CBC unremarkable.  Potassium 3.9, BUN 26, serum creatinine 1.26.  Chest x-ray without active cardiopulmonary disease.  At initial time of cardiology consult he was without symptoms of chest pain.  During MD consultation he developed pinpoint left-sided sharp chest discomfort that lasted for several minutes and spontaneously resolved.    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   a.) cCTA 08/29/2022: asc Ao = 3.9 cm   Erectile dysfunction    a.) on PDE5i (sildenafil ) PRN   Essential (primary) hypertension 12/28/2015   Gastro-esophageal reflux disease without esophagitis 12/28/2015   History of kidney stones    Hypertension    Insomnia    a.) takes trazodone  PRN   Long term current use of aspirin     Mixed hyperlipidemia    Myocardial infarction (HCC)    possible small event EKG changes  OSA on CPAP    Osteoarthritis    RBBB (right bundle branch block)    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[facility])     Past Surgical History:  Procedure Laterality Date   CATARACT EXTRACTION W/PHACO Left 08/13/2023   Procedure: CATARACT EXTRACTION PHACO AND INTRAOCULAR LENS PLACEMENT (IOC) LEFT CLAREON VIVITY 4.78 00:36.5;  Surgeon: Mittie Gaskin, MD;  Location: [facility];  Service: Ophthalmology;   Laterality: Left;   CATARACT EXTRACTION W/PHACO Right 08/27/2023   Procedure: CATARACT EXTRACTION PHACO AND INTRAOCULAR LENS PLACEMENT (IOC) RIGHT DIABETIC CLAREON VIVITY TORIC 5.55 00:25.6;  Surgeon: Mittie Gaskin, MD;  Location: [facility];  Service: Ophthalmology;  Laterality: Right;   CHOLECYSTECTOMY     COLONOSCOPY     2003, 2005, 2008   COLONOSCOPY  10/23/2012   COLONOSCOPY  01/01/2018   COLONOSCOPY WITH PROPOFOL  N/A 12/12/2023   Procedure: COLONOSCOPY WITH PROPOFOL ;  Surgeon: Maryruth Ole DASEN, MD;  Location: [facility];  Service: Endoscopy;  Laterality: N/A;  PREFERS MID MORNING, IF POSSIBLE   CYSTOSCOPY W/ RETROGRADES Right 11/02/2019   Procedure: CYSTOSCOPY WITH RETROGRADE PYELOGRAM;  Surgeon: Twylla Glendia BROCKS, MD;  Location: [facility];  Service: Urology;  Laterality: Right;   CYSTOSCOPY W/ RETROGRADES N/A 01/08/2022   Procedure: CYSTOSCOPY WITH RETROGRADE PYELOGRAM/ BLADDER BIOPSY;  Surgeon: Twylla Glendia BROCKS, MD;  Location: [facility];  Service: Urology;  Laterality: N/A;   CYSTOSCOPY WITH BIOPSY N/A 11/02/2019   Procedure: CYSTOSCOPY WITH BIOPSY;  Surgeon: Twylla Glendia BROCKS, MD;  Location: [facility];  Service: Urology;  Laterality: N/A;   CYSTOSCOPY/RETROGRADE/URETEROSCOPY/STONE EXTRACTION WITH BASKET Right 01/08/2022   Procedure: CYSTOSCOPY/RETROGRADE/URETEROSCOPY/STONE EXTRACTION WITH BASKET;  Surgeon: Twylla Glendia BROCKS, MD;  Location: [facility];  Service: Urology;  Laterality: Right;   CYSTOSCOPY/URETEROSCOPY/HOLMIUM LASER/STENT PLACEMENT Right 12/29/2015   Procedure: CYSTOSCOPY/URETEROSCOPY/ with basketing of stone, right ureteral /STENT PLACEMENT;  Surgeon: Ricardo Likens, MD;  Location: [facility];  Service: Urology;  Laterality: Right;   CYSTOSCOPY/URETEROSCOPY/HOLMIUM LASER/STENT PLACEMENT Right 11/02/2019   Procedure: CYSTOSCOPY/URETEROSCOPY/HOLMIUM LASER/STENT PLACEMENT;  Surgeon: Twylla Glendia BROCKS, MD;  Location: [facility];  Service: Urology;  Laterality: Right;    CYSTOSCOPY/URETEROSCOPY/HOLMIUM LASER/STENT PLACEMENT Right 01/08/2022   Procedure: CYSTOSCOPY/URETEROSCOPY/HOLMIUM LASER/STENT PLACEMENT;  Surgeon: Twylla Glendia BROCKS, MD;  Location: [facility];  Service: Urology;  Laterality: Right;   ERCP  06/15/2003   ESOPHAGOGASTRODUODENOSCOPY     2003, 2004   EYE SURGERY     JOINT REPLACEMENT     TONSILLECTOMY  1957   TOTAL HIP ARTHROPLASTY Right 07/17/2023   Procedure: TOTAL HIP ARTHROPLASTY ANTERIOR APPROACH;  Surgeon: Lorelle Hussar, MD;  Location: [facility];  Service: Orthopedics;  Laterality: Right;   UPPER ESOPHAGEAL ENDOSCOPIC ULTRASOUND (EUS)  07/06/2003     Home Meds: Prior to Admission medications  Medication Sig Start Date End Date Taking? Authorizing Provider  aspirin  EC 81 MG tablet Take 81 mg by mouth daily.    [provider]  atorvastatin  (LIPITOR) 80 MG tablet Take 80 mg by mouth at bedtime. 06/11/22   [provider]  Cholecalciferol  25 MCG (1000 UT) capsule Take 1,000 Units by mouth daily.    [provider]  hydrochlorothiazide  (HYDRODIURIL ) 25 MG tablet Take 25 mg by mouth daily.    [provider]  lisinopril  (PRINIVIL ,ZESTRIL ) 40 MG tablet Take 40 mg by mouth daily.    [provider]  Multiple Vitamins-Minerals (MULTIVITAMIN WITH MINERALS) tablet Take 1 tablet by mouth daily.    [provider]  Omega-3 Fatty Acids (FISH OIL ) 1000 MG CAPS  Take 1,000 mg by mouth daily.    [provider]  omeprazole (PRILOSEC) 20 MG capsule Take 20 mg by mouth daily.    [provider]  sildenafil  (REVATIO ) 20 MG tablet Take 20 mg by mouth daily as needed (ED).    [provider]  traZODone  (DESYREL ) 150 MG tablet Take 75 mg by mouth at bedtime.    [provider]    Inpatient Medications: Scheduled Meds:  heparin   4,000 Units Intravenous Once   Continuous Infusions:  heparin      PRN Meds:   Allergies:   Allergies  Allergen Reactions    Glucosamine-Chondroitin Swelling    Tongue swelling   Penicillins Rash and Other (See Comments)    Childhood allergy.  Has patient had a PCN reaction causing immediate rash, facial/tongue/throat swelling, SOB or lightheadedness with hypotension:  Has patient had a PCN reaction causing severe rash involving mucus membranes or skin necrosis:  Has patient had a PCN reaction that required hospitalization  Has patient had a PCN reaction occurring within the last 10 years:  If all of the above answers are NO, then may proceed with Cephalosporin use.     Social History:   Social History   Socioeconomic History   Marital status: Married    Spouse name: Gaynelle   Number of children: Not on file   Years of education: Not on file   Highest education level: Not on file  Occupational History   Occupation: works as Education Administrator in [place]  Tobacco Use   Smoking status: Never    Passive exposure: Never   Smokeless tobacco: Never  Vaping Use   Vaping status: Never Used  Substance and Sexual Activity   Alcohol use: Yes    Alcohol/week: 1.0 standard drink of alcohol    Types: 1 Shots of liquor per week    Comment: regular, several times per week, liquor   Drug use: No   Sexual activity: Yes  Other Topics Concern   Not on file  Social History Narrative   Two daughters.  Lives with wife.  Retired superior court judge.    Social Drivers of Health   Tobacco Use: Low Risk (10/14/2024)   Patient History    Smoking Tobacco Use: Never    Smokeless Tobacco Use: Never    Passive Exposure: Never  Financial Resource Strain: Low Risk  (05/30/2024)   Received from [facility]   Overall Financial Resource Strain (CARDIA)    Difficulty of Paying Living Expenses: Not hard at all  Food Insecurity: No Food Insecurity (05/30/2024)   Received from [facility]   Epic    Within the past 12 months, you worried that your food would run out before you got the money to  buy more.: Never true    Within the past 12 months, the food you bought just didn't last and you didn't have money to get more.: Never true  Transportation Needs: No Transportation Needs (05/30/2024)   Received from [facility] - Transportation    In the past 12 months, has lack of transportation kept you from medical appointments or from getting medications?: No    Lack of Transportation (Non-Medical): No  Physical Activity: Not on file  Stress: Not on file  Social Connections: Not on file  Intimate Partner Violence: Not At Risk (07/17/2023)   Humiliation, Afraid, Rape, and Kick questionnaire    Fear of Current or Ex-Partner: No  Emotionally Abused: No    Physically Abused: No    Sexually Abused: No  Depression (PHQ2-9): Not on file  Alcohol Screen: Not on file  Housing: Low Risk  (05/30/2024)   Received from [facility]   Epic    In the last 12 months, was there a time when you were not able to pay the mortgage or rent on time?: No    In the past 12 months, how many times have you moved where you were living?: 0    At any time in the past 12 months, were you homeless or living in a shelter (including now)?: No  Utilities: Not At Risk (05/30/2024)   Received from [facility]   Epic    In the past 12 months has the electric, gas, oil, or water  company threatened to shut off services in your home?: No  Health Literacy: Not on file     Family History:   Family History  Problem Relation Age of Onset   Stroke Mother    AAA (abdominal aortic aneurysm) Father    Hypertension Father    Bladder Cancer Neg Hx    Kidney cancer Neg Hx    Prostate cancer Neg Hx     ROS:  Review of Systems  Constitutional:  Negative for chills, diaphoresis, fever, malaise/fatigue and weight loss.  HENT:  Negative for congestion.   Eyes:  Negative for discharge and redness.  Respiratory:  Negative for cough, sputum production, shortness of  breath and wheezing.   Cardiovascular:  Positive for chest pain. Negative for palpitations, orthopnea, claudication, leg swelling and PND.  Gastrointestinal:  Negative for abdominal pain, heartburn, nausea and vomiting.  Musculoskeletal:  Negative for falls and myalgias.  Skin:  Negative for rash.  Neurological:  Negative for dizziness, tingling, tremors, sensory change, speech change, focal weakness, loss of consciousness and weakness.  Endo/Heme/Allergies:  Does not bruise/bleed easily.  Psychiatric/Behavioral:  Negative for substance abuse. The patient is not nervous/anxious.   All other systems reviewed and are negative.     Physical Exam/Data:   Vitals:   10/14/24 1331 10/14/24 1333 10/14/24 1406  BP:  (!) 162/90   Pulse:  74   Resp:  16   Temp:  97.8 F (36.6 C)   TempSrc:  Oral   SpO2:  96% 97%  Weight: 88.5 kg    Height: 6' (1.829 m)     No intake or output data in the 24 hours ending 10/14/24 1544 Filed Weights   10/14/24 1331  Weight: 88.5 kg   Body mass index is 26.45 kg/m.   Physical Exam (as per MD evaluation): General: Well developed, well nourished, in no acute distress. Head: Normocephalic, atraumatic, sclera non-icteric, no xanthomas, nares without discharge.  Neck: Negative for carotid bruits. JVD not elevated. Lungs: Clear bilaterally to auscultation without wheezes, rales, or rhonchi. Breathing is unlabored. Heart: RRR with S1 S2. No murmurs, rubs, or gallops appreciated. Abdomen: Soft, non-tender, non-distended with normoactive bowel sounds. No hepatomegaly. No rebound/guarding. No obvious abdominal masses. Msk:  Strength and tone appear normal for age. Extremities: No clubbing or cyanosis. No edema. Distal pedal pulses are 2+ and equal bilaterally. Neuro: Alert and oriented X 3. No facial asymmetry. No focal deficit. Moves all extremities spontaneously. Psych:  Responds to questions appropriately with a normal affect.   EKG:  The EKG was personally  reviewed and demonstrates: NSR, 73 bpm, RBBB with nonspecific ST-T changes Telemetry:  Telemetry was personally reviewed  and demonstrates: Not currently on tele  Weights: Filed Weights   10/14/24 1331  Weight: 88.5 kg    Relevant CV Studies:  Coronary CTA 08/29/2022: Aorta: Normal size. Mild aortic root and descending aorta calcifications. No dissection.   Aortic Valve:  Trileaflet.  No calcifications.   Coronary Arteries:  Normal coronary origin.  Right dominance.   RCA is a large dominant artery that gives rise to PDA and PLA. There is calcified plaque in the proximal and distal RCA causing mild stenosis (25-49%).   Left main gives rise to LAD and LCX arteries. Calcified plaque in the distal LM causing minimal stenosis (<25%).   LAD has calcified and non calcified plaque proximally causing moderate stenosis (50-69%)   LCX is a non-dominant artery that gives rise to one large OM1 branch. There is calcified plaque in the proximal LCx causing minimal stenosis (<25%).   Other findings:   Normal pulmonary vein drainage into the left atrium.   Normal left atrial appendage without a thrombus.   Normal size of the pulmonary artery.   IMPRESSION: 1. Coronary calcium  score previously calculated 937. 2. Normal coronary origin with right dominance. 3. Moderate proximal LAD stenosis (50-69%). 4. Mild proxima RCA stenosis (25-49%). 5. Minimal stenosis in the distal LM, LCx (<25%).  ctFFR: 1. Left Main:  No significant stenosis. 2. LAD: No significant stenosis.  FFRct 0.85 3. LCX: No significant stenosis.  FFRct 0.97 4. RCA: No significant stenosis.  FFRct 0.94   IMPRESSION: 1.  CT FFR analysis didn't show any significant stenosis. __________  Calcium  score 06/07/2022: Left Main: 0   LAD: 796   LCx: 35.2   RCA: 105   Total Agatston Score: 937   MESA database percentile: 77   AORTA MEASUREMENTS:   Ascending Aorta: 4.2 cm   Descending Aorta:2.6 cm   OTHER  FINDINGS:   Cardiovascular: Aortic atherosclerosis and 3 vessel coronary artery disease. Normal heart size. No pericardial effusion.   Mediastinum/Nodes: No adenopathy or acute process in the mediastinum.   Lungs/Pleura: Lungs are clear and airways are patent to the extent evaluated.   Upper Abdomen: No acute findings in the upper abdomen.   Musculoskeletal: No acute bone finding. No destructive bone process. Spinal degenerative changes. Degenerative changes are mild-to-moderate.   IMPRESSION: 1. Coronary artery calcium  score of 937. [place] database percentile: 6. 2. Aortic atherosclerosis and 3 vessel coronary artery disease.    Laboratory Data:  Chemistry Recent Labs  Lab 10/14/24 1335  NA 138  K 3.9  CL 102  CO2 24  GLUCOSE 129*  BUN 26*  CREATININE 1.26*  CALCIUM  9.7  GFRNONAA 58*  ANIONGAP 12    No results for input(s): PROT, ALBUMIN, AST, ALT, ALKPHOS, BILITOT in the last 168 hours. Hematology Recent Labs  Lab 10/14/24 1335  WBC 6.3  RBC 4.87  HGB 14.5  HCT 42.3  MCV 86.9  MCH 29.8  MCHC 34.3  RDW 13.3  PLT 169   Cardiac EnzymesNo results for input(s): TROPONINI in the last 168 hours. No results for input(s): TROPIPOC in the last 168 hours.  BNPNo results for input(s): BNP, PROBNP in the last 168 hours.  DDimer No results for input(s): DDIMER in the last 168 hours.  Radiology/Studies:  DG Chest 2 View Result Date: 10/14/2024 IMPRESSION: No active cardiopulmonary disease. Electronically Signed   By: Suzen Dials M.D.   On: 10/14/2024 13:54    Assessment and Plan:   1. CAD involving the native coronary arteries with unstable  angina: - Currently, without symptoms of angina or cardiac decompensation - He reports an increase in sporadic left-sided chest discomfort over the past 24 hours from baseline - Coronary CTA in 08/2022 demonstrated moderate nonobstructive LAD stenosis estimated at 50 to 69% that was not  hemodynamically significant by CT FFR - Initial high-sensitivity troponin negative with delta troponin pending - Given concern for unstable angina the patient has been started on heparin  drip - N.p.o. at midnight - Plan for LHC on 10/15/2024 - Obtain echo - ASA 81 mg daily - PTA atorvastatin  80 mg - LP(a) pending  2.  HTN: - Blood pressure 162/90 upon presentation to the ED - Trend with recommendation to escalate antihypertensive pharmacotherapy as indicated - With intermittent renal dysfunction noted on labs, may need to consider transitioning HCTZ to alternative antihypertensive medication - PTA lisinopril  40 mg  3.  HLD: - LDL 46 in 05/2024 - PTA atorvastatin  80 mg  4.  Renal dysfunction: - Patient intermittently noted to have elevations in serum creatinine with GFR typically in the 50s - Monitor   5.  Hyperglycemia: - A1c 6.0 in 05/2024   Informed Consent   Shared Decision Making/Informed Consent{  The risks [stroke (1 in 1000), death (1 in 1000), kidney failure [usually temporary] (1 in 500), bleeding (1 in 200), allergic reaction [possibly serious] (1 in 200)], benefits (diagnostic support and management of coronary artery disease) and alternatives of a cardiac catheterization were discussed in detail with Andre Stone and he is willing to proceed.       For questions or updates, please contact [facility] Please consult [email or web address] for contact info under Cardiology/STEMI.   Signed, Bernardino Bring, PA-C [facility] Pager: [phone] 10/14/2024, 3:44 PM    "

## 2024-10-14 NOTE — ED Triage Notes (Signed)
 Patient c/o left sided sharp, chest pain for over a day. Denies SOB. Denies radiation

## 2024-10-14 NOTE — Telephone Encounter (Signed)
" °  Pt c/o of Chest Pain: STAT if active CP, including tightness, pressure, jaw pain, radiating pain to shoulder/upper arm/back, CP unrelieved by Nitro. Symptoms reported of SOB, nausea, vomiting, sweating.  1. Are you having CP right now?   No - but 45 minutes ago  2. Are you experiencing any other symptoms (ex. SOB, nausea, vomiting, sweating)?   No  3. Is your CP continuous or coming and going?   Coming and going  4. Have you taken Nitroglycerin ?   No  5. How long have you been experiencing CP?  Started about 24 hours ago and has 6-7 episodes  6. If NO CP at time of call then end call with telling Pt to call back or call 911 if Chest pain returns prior to return call from triage team.   Patient stated he has been having intermittent chest pains which lasts about 2-3 minutes. "

## 2024-10-15 ENCOUNTER — Encounter
Admission: EM | Disposition: A | Discharge status: Home / Self Care | Payer: Self-pay | Source: Ambulatory Visit | Attending: Emergency Medicine

## 2024-10-15 ENCOUNTER — Encounter: Payer: Self-pay | Admitting: Internal Medicine

## 2024-10-15 ENCOUNTER — Observation Stay (HOSPITAL_BASED_OUTPATIENT_CLINIC_OR_DEPARTMENT_OTHER): Admit: 2024-10-15 | Discharge: 2024-10-15 | Disposition: A | Attending: Internal Medicine | Admitting: Internal Medicine

## 2024-10-15 DIAGNOSIS — I200 Unstable angina: Secondary | ICD-10-CM

## 2024-10-15 DIAGNOSIS — I10 Essential (primary) hypertension: Secondary | ICD-10-CM

## 2024-10-15 DIAGNOSIS — I25110 Atherosclerotic heart disease of native coronary artery with unstable angina pectoris: Secondary | ICD-10-CM

## 2024-10-15 DIAGNOSIS — R079 Chest pain, unspecified: Secondary | ICD-10-CM

## 2024-10-15 LAB — LIPID PANEL
Cholesterol: 142 mg/dL (ref 0–200)
HDL: 55 mg/dL
LDL Cholesterol: 63 mg/dL (ref 0–99)
Total CHOL/HDL Ratio: 2.6 ratio
Triglycerides: 117 mg/dL
VLDL: 23 mg/dL (ref 0–40)

## 2024-10-15 LAB — ECHOCARDIOGRAM COMPLETE
AR max vel: 2.88 cm2
AV Area VTI: 2.93 cm2
AV Area mean vel: 2.68 cm2
AV Mean grad: 2 mmHg
AV Peak grad: 4.2 mmHg
Ao pk vel: 1.03 m/s
Area-P 1/2: 2.82 cm2
Height: 72 in
MV VTI: 2.85 cm2
S' Lateral: 3.2 cm
Weight: 3120 [oz_av]

## 2024-10-15 LAB — CBC
HCT: 36.3 % — ABNORMAL LOW (ref 39.0–52.0)
Hemoglobin: 12.3 g/dL — ABNORMAL LOW (ref 13.0–17.0)
MCH: 29.9 pg (ref 26.0–34.0)
MCHC: 33.9 g/dL (ref 30.0–36.0)
MCV: 88.1 fL (ref 80.0–100.0)
Platelets: 144 K/uL — ABNORMAL LOW (ref 150–400)
RBC: 4.12 MIL/uL — ABNORMAL LOW (ref 4.22–5.81)
RDW: 13.3 % (ref 11.5–15.5)
WBC: 5.9 K/uL (ref 4.0–10.5)
nRBC: 0 % (ref 0.0–0.2)

## 2024-10-15 LAB — TSH: TSH: 2.44 u[IU]/mL (ref 0.350–4.500)

## 2024-10-15 LAB — HEPARIN LEVEL (UNFRACTIONATED): Heparin Unfractionated: 0.6 [IU]/mL (ref 0.30–0.70)

## 2024-10-15 MED ORDER — FREE WATER: 500.0000 mL | Freq: Once | Status: DC

## 2024-10-15 MED ORDER — LISINOPRIL 10 MG PO TABS: 40.0000 mg | ORAL_TABLET | Freq: Every day | ORAL | Status: DC

## 2024-10-15 MED ORDER — IOHEXOL 300 MG/ML  SOLN
INTRAMUSCULAR | Status: DC | PRN
  Administered 2024-10-15: 48 mL

## 2024-10-15 MED ORDER — VERAPAMIL HCL 2.5 MG/ML IV SOLN
INTRAVENOUS | Status: DC | PRN
  Administered 2024-10-15 (×2): 2.5 mg via INTRAVENOUS

## 2024-10-15 MED ORDER — ASPIRIN 81 MG PO TBEC: 81.0000 mg | DELAYED_RELEASE_TABLET | Freq: Every day | ORAL | Status: DC

## 2024-10-15 MED ORDER — ASPIRIN 300 MG RE SUPP: 300.0000 mg | RECTAL | Status: DC

## 2024-10-15 MED ORDER — HYDROCHLOROTHIAZIDE 25 MG PO TABS: 25.0000 mg | ORAL_TABLET | Freq: Every day | ORAL | Status: DC

## 2024-10-15 MED ORDER — MIDAZOLAM HCL (PF) 2 MG/2ML IJ SOLN
INTRAMUSCULAR | Status: DC | PRN
  Administered 2024-10-15 (×2): 1 mg via INTRAVENOUS

## 2024-10-15 MED ORDER — ASPIRIN 81 MG PO CHEW: 81.0000 mg | CHEWABLE_TABLET | ORAL | Status: DC

## 2024-10-15 MED ORDER — HEPARIN (PORCINE) IN NACL 2000-0.9 UNIT/L-% IV SOLN
INTRAVENOUS | Status: DC | PRN
  Administered 2024-10-15: 1000 mL

## 2024-10-15 MED ORDER — LIDOCAINE HCL (PF) 1 % IJ SOLN
INTRAMUSCULAR | Status: DC | PRN
  Administered 2024-10-15: 2 mL

## 2024-10-15 MED ORDER — NITROGLYCERIN 0.4 MG SL SUBL: 0.4000 mg | SUBLINGUAL_TABLET | SUBLINGUAL | Status: DC | PRN

## 2024-10-15 MED ORDER — FENTANYL CITRATE (PF) 100 MCG/2ML IJ SOLN
INTRAMUSCULAR | Status: DC | PRN
  Administered 2024-10-15 (×2): 25 ug via INTRAVENOUS

## 2024-10-15 MED ORDER — ONDANSETRON HCL 4 MG/2ML IJ SOLN: 4.0000 mg | Freq: Four times a day (QID) | INTRAMUSCULAR | Status: DC | PRN

## 2024-10-15 MED ORDER — SODIUM CHLORIDE 0.9 % IV SOLN: 250.0000 mL | INTRAVENOUS | Status: DC | PRN

## 2024-10-15 MED ORDER — SODIUM CHLORIDE 0.9% FLUSH: 3.0000 mL | INTRAVENOUS | Status: DC | PRN

## 2024-10-15 MED ORDER — ENOXAPARIN SODIUM 40 MG/0.4ML IJ SOSY: 40.0000 mg | PREFILLED_SYRINGE | INTRAMUSCULAR | Status: DC

## 2024-10-15 MED ORDER — FENTANYL CITRATE (PF) 100 MCG/2ML IJ SOLN
INTRAMUSCULAR | Status: AC
  Filled 2024-10-15: qty 2

## 2024-10-15 MED ORDER — HEPARIN SODIUM (PORCINE) 1000 UNIT/ML IJ SOLN
INTRAMUSCULAR | Status: DC | PRN
  Administered 2024-10-15: 4500 [IU] via INTRAVENOUS

## 2024-10-15 MED ORDER — HEPARIN (PORCINE) IN NACL 1000-0.9 UT/500ML-% IV SOLN
INTRAVENOUS | Status: AC
  Filled 2024-10-15: qty 1000

## 2024-10-15 MED ORDER — LIDOCAINE HCL 1 % IJ SOLN
INTRAMUSCULAR | Status: AC
  Filled 2024-10-15: qty 20

## 2024-10-15 MED ORDER — HEPARIN SODIUM (PORCINE) 1000 UNIT/ML IJ SOLN
INTRAMUSCULAR | Status: AC
  Filled 2024-10-15: qty 10

## 2024-10-15 MED ORDER — NITROGLYCERIN 0.4 MG SL SUBL
SUBLINGUAL_TABLET | SUBLINGUAL | Status: AC
  Filled 2024-10-15: qty 1

## 2024-10-15 MED ORDER — ASPIRIN 81 MG PO CHEW: 324.0000 mg | CHEWABLE_TABLET | ORAL | Status: DC

## 2024-10-15 MED ORDER — ATORVASTATIN CALCIUM 20 MG PO TABS: 80.0000 mg | ORAL_TABLET | Freq: Every day | ORAL | Status: DC

## 2024-10-15 MED ORDER — PANTOPRAZOLE SODIUM 40 MG PO TBEC: 40.0000 mg | DELAYED_RELEASE_TABLET | Freq: Every day | ORAL | Status: DC

## 2024-10-15 MED ORDER — HYDRALAZINE HCL 20 MG/ML IJ SOLN: 10.0000 mg | INTRAMUSCULAR | Status: DC | PRN

## 2024-10-15 MED ORDER — ASPIRIN 81 MG PO CHEW
CHEWABLE_TABLET | ORAL | Status: AC
  Filled 2024-10-15: qty 1

## 2024-10-15 MED ORDER — TRAZODONE HCL 50 MG PO TABS: 75.0000 mg | ORAL_TABLET | Freq: Every day | ORAL | Status: DC

## 2024-10-15 MED ORDER — SODIUM CHLORIDE 0.9% FLUSH: 3.0000 mL | Freq: Two times a day (BID) | INTRAVENOUS | Status: DC

## 2024-10-15 MED ORDER — ACETAMINOPHEN 325 MG PO TABS: 650.0000 mg | ORAL_TABLET | ORAL | Status: DC | PRN

## 2024-10-15 MED ORDER — LABETALOL HCL 5 MG/ML IV SOLN: 10.0000 mg | INTRAVENOUS | Status: DC | PRN

## 2024-10-15 MED ORDER — VERAPAMIL HCL 2.5 MG/ML IV SOLN
INTRAVENOUS | Status: AC
  Filled 2024-10-15: qty 2

## 2024-10-15 MED ORDER — MIDAZOLAM HCL 2 MG/2ML IJ SOLN
INTRAMUSCULAR | Status: AC
  Filled 2024-10-15: qty 2

## 2024-10-15 MED ORDER — ZOLPIDEM TARTRATE 5 MG PO TABS: 5.0000 mg | ORAL_TABLET | Freq: Every evening | ORAL | Status: DC | PRN

## 2024-10-15 NOTE — Progress Notes (Signed)
*  PRELIMINARY RESULTS* Echocardiogram 2D Echocardiogram has been performed.  Floydene Harder 10/15/2024, 12:26 PM

## 2024-10-15 NOTE — Discharge Summary (Signed)
 Physician Discharge Summary  MELL GUIA FMW:[id number] DOB: Feb 26, 1946 DOA: 10/14/2024  PCP: Alla Amis, MD  Admit date: 10/14/2024 Discharge date: 10/15/2024  Admitted From: home  Disposition:  home   Recommendations for Outpatient Follow-up:  Follow up with PCP in 1-2 weeks F/u w/ cardio, Dr. Lavona, in 1-2 weeks   Home Health: no  Equipment/Devices:  Discharge Condition: stable  CODE STATUS: full  Diet recommendation: Heart Healthy   Brief/Interim Summary: HPI was taken from Dr. Laurita: Andre Stone is a 79 y.o. male with medical history significant of HTN, HLD, CKD stage III, GERD, presented with new onset chest pains.   Patient reported that he used to have intermittent chest pain sharp-like in the center of the chest nonradiating, self-limited, used to happen only once a month or so.  For which he went to see cardiology 2 years ago and underwent CTA of coronary arteries which showed low calcium  score and patient has been followed up outpatient since.  At this time, the same chest pain became more frequent  10-15 times a day, similarly in the center of her chest nonradiating denied any associated symptoms of shortness of breath lightheadedness palpitations nauseous vomiting.  He described the pain as different from indigestion or kidney Stone associated pain.  He even had several episodes at night which woke him up.  However he did not take any additional aspirin  or nitroglycerin  for it and for all occasions he just sit down or lie down flat episode past usually lasted for somewhere 3 to 6 minutes.   ED Course: Afebrile, nontachycardic blood pressure 160/90 utilization 98% room air.  Troponin negative x 2, EKG showed sinus rhythm, chronic RBBB, chronic nonspecific ST changes.   Cardiology consulted and patient was started on heparin  drip.  Discharge Diagnoses:  Principal Problem:   Chest pain Active Problems:   Essential hypertension   Coronary artery  disease involving native coronary artery of native heart with unstable angina pectoris (HCC)   Unstable angina (HCC)   Diabetes mellitus (HCC) Unstable angina: w/ hx of CAD. S/p LHC and no stents were placed. Continue on aspirin , statin. Restart home dose of lisinopril , hydrochlorothiazide  tomorrow as per cardio. Echo shows EF 60-65%, no regional wall motion abnormalities, grade I diastolic dysfunction, no atrial level shunt detected.   CKD II: given IVFs prior to [facility].    HTN: restart home dose of lisinopril , hydrochlorothiazide  tomorrow as per cardio   Thrombocytopenia: etiology unclear. Will continue to monitor    Discharge Instructions  Discharge Instructions     Diet - low sodium heart healthy   Complete by: As directed    Discharge instructions   Complete by: As directed    F/u w/ PCP in 1-2 weeks. F/u w/ cardio, Dr. Lavona, in 1-2 weeks.   Increase activity slowly   Complete by: As directed             Consultations: Cardio    Procedures/Studies: ECHOCARDIOGRAM COMPLETE Result Date: 10/15/2024    ECHOCARDIOGRAM REPORT   Patient Name:   Andre Stone Date of Exam: 10/15/2024 Medical Rec #:  [id number]          Height:       72.0 in Accession #:    [id number]         Weight:       195.0 lb Date of Birth:  May 30, 1946          BSA:  2.108 m Patient Age:    25 years           BP:           125/84 mmHg Patient Gender: M                  HR:           69 bpm. Exam Location:  [facility] Procedure: 2D Echo, Color Doppler and Cardiac Doppler (Both Spectral and Color            Flow Doppler were utilized during procedure). Indications:     Chest pain R07.9  History:         Patient has no prior history of Echocardiogram examinations.                  Arrythmias:RBBB; Risk Factors:Hypertension.  Sonographer:     Christopher Furnace Referring Phys:  [id number] CHRISTOPHER END Diagnosing Phys: Redell Cave MD  Sonographer Comments: No parasternal window. IMPRESSIONS  1. Left ventricular  ejection fraction, by estimation, is 60 to 65%. Left ventricular ejection fraction by PLAX is 65 %. The left ventricle has normal function. The left ventricle has no regional wall motion abnormalities. Left ventricular diastolic parameters are consistent with Grade I diastolic dysfunction (impaired relaxation).  2. Right ventricular systolic function is normal. The right ventricular size is normal.  3. The mitral valve is normal in structure. No evidence of mitral valve regurgitation.  4. The aortic valve was not well visualized. Aortic valve regurgitation is not visualized.  5. Aortic dilatation noted. There is mild dilatation of the aortic root, measuring 41 mm.  6. The inferior vena cava is normal in size with greater than 50% respiratory variability, suggesting right atrial pressure of 3 mmHg. FINDINGS  Left Ventricle: Left ventricular ejection fraction, by estimation, is 60 to 65%. Left ventricular ejection fraction by PLAX is 65 %. The left ventricle has normal function. The left ventricle has no regional wall motion abnormalities. The left ventricular internal cavity size was normal in size. There is no left ventricular hypertrophy. Left ventricular diastolic parameters are consistent with Grade I diastolic dysfunction (impaired relaxation). Right Ventricle: The right ventricular size is normal. No increase in right ventricular wall thickness. Right ventricular systolic function is normal. Left Atrium: Left atrial size was normal in size. Right Atrium: Right atrial size was normal in size. Pericardium: There is no evidence of pericardial effusion. Mitral Valve: The mitral valve is normal in structure. No evidence of mitral valve regurgitation. MV peak gradient, 3.5 mmHg. The mean mitral valve gradient is 1.0 mmHg. Tricuspid Valve: The tricuspid valve is normal in structure. Tricuspid valve regurgitation is not demonstrated. Aortic Valve: The aortic valve was not well visualized. Aortic valve regurgitation is  not visualized. Aortic valve mean gradient measures 2.0 mmHg. Aortic valve peak gradient measures 4.2 mmHg. Aortic valve area, by VTI measures 2.93 cm. Pulmonic Valve: The pulmonic valve was not well visualized. Pulmonic valve regurgitation is not visualized. Aorta: Aortic dilatation noted. There is mild dilatation of the aortic root, measuring 41 mm. Venous: The inferior vena cava is normal in size with greater than 50% respiratory variability, suggesting right atrial pressure of 3 mmHg. IAS/Shunts: No atrial level shunt detected by color flow Doppler.  LEFT VENTRICLE PLAX 2D LV EF:         Left            Diastology  ventricular     LV e' medial:    8.38 cm/s                ejection        LV E/e' medial:  6.6                fraction by     LV e' lateral:   8.92 cm/s                PLAX is 65      LV E/e' lateral: 6.2                %. LVIDd:         5.00 cm LVIDs:         3.20 cm LV PW:         1.20 cm LV IVS:        0.90 cm LVOT diam:     2.30 cm LV SV:         64 LV SV Index:   30 LVOT Area:     4.15 cm  RIGHT VENTRICLE RV Basal diam:  2.80 cm RV Mid diam:    3.10 cm RV S prime:     7.83 cm/s TAPSE (M-mode): 2.3 cm LEFT ATRIUM             Index        RIGHT ATRIUM           Index LA diam:        3.50 cm 1.66 cm/m   RA Area:     16.40 cm LA Vol (A2C):   36.7 ml 17.41 ml/m  RA Volume:   42.60 ml  20.21 ml/m LA Vol (A4C):   40.2 ml 19.07 ml/m LA Biplane Vol: 42.3 ml 20.07 ml/m  AORTIC VALVE AV Area (Vmax):    2.88 cm AV Area (Vmean):   2.68 cm AV Area (VTI):     2.93 cm AV Vmax:           103.00 cm/s AV Vmean:          70.600 cm/s AV VTI:            0.217 m AV Peak Grad:      4.2 mmHg AV Mean Grad:      2.0 mmHg LVOT Vmax:         71.30 cm/s LVOT Vmean:        45.600 cm/s LVOT VTI:          0.153 m LVOT/AV VTI ratio: 0.71  AORTA Ao Root diam: 4.10 cm MITRAL VALVE               TRICUSPID VALVE MV Area (PHT): 2.82 cm    TR Peak grad:   6.5 mmHg MV Area VTI:   2.85 cm    TR Vmax:         127.00 cm/s MV Peak grad:  3.5 mmHg MV Mean grad:  1.0 mmHg    SHUNTS MV Vmax:       0.93 m/s    Systemic VTI:  0.15 m MV Vmean:      48.8 cm/s   Systemic Diam: 2.30 cm MV Decel Time: 269 msec MV E velocity: 55.00 cm/s MV A velocity: 74.40 cm/s MV E/A ratio:  0.74 Redell Cave MD Electronically signed by Redell Cave MD Signature Date/Time: 10/15/2024/12:50:03 PM    Final    CARDIAC CATHETERIZATION Result Date: 10/15/2024  Prox RCA to Mid RCA lesion is 40% stenosed.   Dist RCA-1 lesion is 25% stenosed.   Dist RCA-2 lesion is 20% stenosed.   Prox LAD lesion is 40% stenosed.   The left ventricular systolic function is normal.   LV end diastolic pressure is normal.   The left ventricular ejection fraction is greater than 65% by visual estimate.   There is no aortic valve stenosis.   In the absence of any other complications or medical issues, we expect the patient to be ready for discharge from a cath perspective on 10/15/2024.   Recommend Aspirin  81mg  daily for moderate CAD. Conclusions: Mild-moderate, nonobstructive coronary artery disease, as detailed below, including 40% proximal LAD and proximal/mid RCA stenoses.  There is also 20-30% distal RCA plaquing.  No obstructive lesion identified to explain the patient's recent chest pain at rest. Normal left ventricular systolic function (LVEF greater than 65%) and filling pressure (LVEDP 12 mmHg). Recommendations: Follow-up echocardiogram. Continue medical therapy and risk factor modification to prevent progression of moderate coronary artery disease. Lonni Hanson, MD [facility]  DG Chest 2 View Result Date: 10/14/2024 CLINICAL DATA:  Chest pain. EXAM: CHEST - 2 VIEW COMPARISON:  None Available. FINDINGS: The heart size and mediastinal contours are within normal limits. Both lungs are clear. The visualized skeletal structures are unremarkable. IMPRESSION: No active cardiopulmonary disease. Electronically Signed   By: Suzen Dials M.D.   On:  10/14/2024 13:54   (Echo, Carotid, EGD, Colonoscopy, ERCP)    Subjective: Pt denies any chest pain or shortness of breath    Discharge Exam: Vitals:   10/15/24 1230 10/15/24 1300  BP: 113/79 115/79  Pulse: 64 (!) 59  Resp: 11 16  Temp:    SpO2: 96% 95%   Vitals:   10/15/24 1131 10/15/24 1200 10/15/24 1230 10/15/24 1300  BP: 125/84 123/80 113/79 115/79  Pulse:  64 64 (!) 59  Resp: 13 11 11 16   Temp:      TempSrc:      SpO2: 96% 96% 96% 95%  Weight:      Height:        General: Pt is alert, awake, not in acute distress Cardiovascular:  S1/S2 +, no rubs, no gallops Respiratory: CTA bilaterally, no wheezing, no rhonchi Abdominal: Soft, NT, ND, bowel sounds + Extremities: no edema, no cyanosis    The results of significant diagnostics from this hospitalization (including imaging, microbiology, ancillary and laboratory) are listed below for reference.     Microbiology: No results found for this or any previous visit (from the past 240 hours).   Labs: BNP (last 3 results) No results for input(s): BNP in the last 8760 hours. Basic Metabolic Panel: Recent Labs  Lab 10/14/24 1335  NA 138  K 3.9  CL 102  CO2 24  GLUCOSE 129*  BUN 26*  CREATININE 1.26*  CALCIUM  9.7   Liver Function Tests: No results for input(s): AST, ALT, ALKPHOS, BILITOT, PROT, ALBUMIN in the last 168 hours. No results for input(s): LIPASE, AMYLASE in the last 168 hours. No results for input(s): AMMONIA in the last 168 hours. CBC: Recent Labs  Lab 10/14/24 1335 10/15/24 0500  WBC 6.3 5.9  HGB 14.5 12.3*  HCT 42.3 36.3*  MCV 86.9 88.1  PLT 169 144*   Cardiac Enzymes: No results for input(s): CKTOTAL, CKMB, CKMBINDEX, TROPONINI in the last 168 hours. BNP: Invalid input(s): POCBNP CBG: No results for input(s): GLUCAP in the last 168 hours. D-Dimer No results for input(s):  DDIMER in the last 72 hours. Hgb A1c No results for input(s): HGBA1C in  the last 72 hours. Lipid Profile Recent Labs    10/14/24 1335  CHOL 142  HDL 55  LDLCALC 63  TRIG 117  CHOLHDL 2.6   Thyroid  function studies Recent Labs    10/15/24 0315  TSH 2.440   Anemia work up No results for input(s): VITAMINB12, FOLATE, FERRITIN, TIBC, IRON, RETICCTPCT in the last 72 hours. Urinalysis    Component Value Date/Time   COLORURINE YELLOW (A) 07/08/2023 1050   APPEARANCEUR Clear 08/13/2024 0759   LABSPEC 1.013 07/08/2023 1050   PHURINE 6.0 07/08/2023 1050   GLUCOSEU Negative 08/13/2024 0759   HGBUR NEGATIVE 07/08/2023 1050   BILIRUBINUR Negative 08/13/2024 0759   KETONESUR NEGATIVE 07/08/2023 1050   PROTEINUR Negative 08/13/2024 0759   PROTEINUR NEGATIVE 07/08/2023 1050   NITRITE Negative 08/13/2024 0759   NITRITE NEGATIVE 07/08/2023 1050   LEUKOCYTESUR Negative 08/13/2024 0759   LEUKOCYTESUR NEGATIVE 07/08/2023 1050   Sepsis Labs Recent Labs  Lab 10/14/24 1335 10/15/24 0500  WBC 6.3 5.9   Microbiology No results found for this or any previous visit (from the past 240 hours).   Time coordinating discharge: 35 minutes  SIGNED:   Anthony CHRISTELLA Pouch, MD  [facility] 10/15/2024, 1:12 PM Pager   If 7PM-7AM, please contact night-coverage [email or web address]

## 2024-10-15 NOTE — Consult Note (Signed)
 Pharmacy Consult Note - Anticoagulation  Pharmacy Consult for heparin  infusion Indication: chest pain/ACS   PATIENT MEASUREMENTS: Height: 6' (182.9 cm) Weight: 88.5 kg (195 lb) IBW/kg (Calculated) : 77.6 HEPARIN  DW (KG): 88.5  VITAL SIGNS: Temp: 98.4 F (36.9 C) (01/22 1944) Temp Source: Oral (01/22 1944) BP: 123/77 (01/23 0000) Pulse Rate: 79 (01/23 0000)  Recent Labs    10/14/24 1335 10/14/24 1449 10/15/24 0020  HGB 14.5  --   --   HCT 42.3  --   --   PLT 169  --   --   APTT  --  26  --   LABPROT  --  13.5  --   INR  --  1.0  --   HEPARINUNFRC  --   --  0.60  CREATININE 1.26*  --   --     Estimated Creatinine Clearance: 53 mL/min (A) (by C-G formula based on SCr of 1.26 mg/dL (H)).  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   a.) cCTA 08/29/2022: asc Ao = 3.9 cm   Erectile dysfunction    a.) on PDE5i (sildenafil ) PRN   Essential (primary) hypertension 12/28/2015   Gastro-esophageal reflux disease without esophagitis 12/28/2015   History of kidney stones    Hypertension    Insomnia    a.) takes trazodone  PRN   Long term current use of aspirin     Mixed hyperlipidemia    Myocardial infarction (HCC)    possible small event EKG changes   OSA on CPAP    Osteoarthritis    RBBB (right bundle branch block)    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(HCC)     Medications:  (Not in a hospital admission)  Scheduled:   aspirin  EC  81 mg Oral Daily   atorvastatin   80 mg Oral Daily    Infusions:   sodium chloride  100 mL/hr at 10/14/24 1824   heparin  1,150 Units/hr (10/14/24 1709)     ASSESSMENT: 79 y.o. male with PMH HTN and arthritis is presenting with chest pain. Per MD Arlander, will treat with heparin  infusion for unstable angina. Patient is not on chronic anticoagulation per chart review. Pharmacy has been consulted to initiate and manage heparin  intravenous infusion.   Goal(s) of therapy: Heparin  level 0.3 - 0.7 units/mL aPTT 66 - 102 seconds Monitor platelets by anticoagulation protocol: Yes   Baseline anticoagulation labs: Recent Labs    10/14/24 1335 10/14/24 1449  APTT  --  26  INR  --  1.0  HGB 14.5  --   PLT 169  --    Baseline INR amd aPTT pending  01/23 0020 HL 0.60, therapeutic x 1  PLAN: Continue heparin  infusion at 1,150 units/hour. Recheck heparin  level in 8 hours to confirm, then daily once at least two levels are consecutively therapeutic. Monitor CBC daily while on heparin  infusion.  Rankin CANDIE Dills, PharmD, [facility] 10/15/2024 12:52 AM

## 2024-10-15 NOTE — Interval H&P Note (Signed)
 History and Physical Interval Note:  10/15/2024 9:06 AM  Andre Stone  has presented today for surgery, with the diagnosis of unstable angina.  The various methods of treatment have been discussed with the patient and family. After consideration of risks, benefits and other options for treatment, the patient has consented to  Procedures: LEFT HEART CATH AND CORONARY ANGIOGRAPHY (N/A) as a surgical intervention.  The patient's history has been reviewed, patient examined, no change in status, stable for surgery.  I have reviewed the patient's chart and labs.  Questions were answered to the patient's satisfaction.    Cath Lab Visit (complete for each Cath Lab visit)  Clinical Evaluation Leading to the Procedure:   ACS: Yes.    Non-ACS:  N/A  Carollynn Pennywell

## 2024-10-15 NOTE — Progress Notes (Signed)
 "  Progress Note  Patient Name: Andre Stone Date of Encounter: 10/15/2024  Primary Cardiologist: Hochrein  Subjective   Had a brief episode of chest pain last evening that was self-limiting, none since. No dyspnea. Hemodynamically stable. NPO for LHC today. Echo pending.   Inpatient Medications    Scheduled Meds:  aspirin        aspirin   324 mg Oral NOW   Or   aspirin   300 mg Rectal NOW   [START ON 10/16/2024] aspirin   81 mg Oral Pre-Cath   [START ON 10/16/2024] aspirin  EC  81 mg Oral Daily   atorvastatin   80 mg Oral QHS   [START ON 10/16/2024] free water   500 mL Oral Once   hydrochlorothiazide   25 mg Oral Daily   lisinopril   40 mg Oral Daily   nitroGLYCERIN        pantoprazole   40 mg Oral Daily   traZODone   75 mg Oral QHS   Continuous Infusions:  heparin  1,150 Units/hr (10/14/24 1709)   PRN Meds: aspirin , acetaminophen , nitroGLYCERIN , nitroGLYCERIN , ondansetron  (ZOFRAN ) IV, traZODone , zolpidem    Vital Signs    Vitals:   10/15/24 0400 10/15/24 0525 10/15/24 0600 10/15/24 0812  BP: 107/71  119/83 133/75  Pulse: 64  66 66  Resp: 14  12 11   Temp:  97.9 F (36.6 C)  98.1 F (36.7 C)  TempSrc:  Oral  Oral  SpO2: 95%  95% 95%  Weight:    88.5 kg  Height:    6' (1.829 m)    Intake/Output Summary (Last 24 hours) at 10/15/2024 0832 Last data filed at 10/14/2024 1709 Gross per 24 hour  Intake 55.04 ml  Output --  Net 55.04 ml   Filed Weights   10/14/24 1331 10/15/24 0812  Weight: 88.5 kg 88.5 kg    Telemetry    SR, 70s to 80s bpm - Personally Reviewed  ECG    No new tracings - Personally Reviewed  Physical Exam   GEN: No acute distress.   Neck: No JVD. Cardiac: RRR, no murmurs, rubs, or gallops.  Respiratory: Clear to auscultation bilaterally.  GI: Soft, nontender, non-distended.   MS: No edema; No deformity. Neuro:  Alert and oriented x 3; Nonfocal.  Psych: Normal affect.  Labs    Chemistry Recent Labs  Lab 10/14/24 1335  NA 138  K 3.9   CL 102  CO2 24  GLUCOSE 129*  BUN 26*  CREATININE 1.26*  CALCIUM  9.7  GFRNONAA 58*  ANIONGAP 12     Hematology Recent Labs  Lab 10/14/24 1335 10/15/24 0500  WBC 6.3 5.9  RBC 4.87 4.12*  HGB 14.5 12.3*  HCT 42.3 36.3*  MCV 86.9 88.1  MCH 29.8 29.9  MCHC 34.3 33.9  RDW 13.3 13.3  PLT 169 144*    Cardiac EnzymesNo results for input(s): TROPONINI in the last 168 hours. No results for input(s): TROPIPOC in the last 168 hours.   BNPNo results for input(s): BNP, PROBNP in the last 168 hours.   DDimer No results for input(s): DDIMER in the last 168 hours.   Radiology    DG Chest 2 View Result Date: 10/14/2024 IMPRESSION: No active cardiopulmonary disease. Electronically Signed   By: Suzen Dials M.D.   On: 10/14/2024 13:54    Cardiac Studies   Coronary CTA 08/29/2022: Aorta: Normal size. Mild aortic root and descending aorta calcifications. No dissection.   Aortic Valve:  Trileaflet.  No calcifications.   Coronary Arteries:  Normal coronary origin.  Right  dominance.   RCA is a large dominant artery that gives rise to PDA and PLA. There is calcified plaque in the proximal and distal RCA causing mild stenosis (25-49%).   Left main gives rise to LAD and LCX arteries. Calcified plaque in the distal LM causing minimal stenosis (<25%).   LAD has calcified and non calcified plaque proximally causing moderate stenosis (50-69%)   LCX is a non-dominant artery that gives rise to one large OM1 branch. There is calcified plaque in the proximal LCx causing minimal stenosis (<25%).   Other findings:   Normal pulmonary vein drainage into the left atrium.   Normal left atrial appendage without a thrombus.   Normal size of the pulmonary artery.   IMPRESSION: 1. Coronary calcium  score previously calculated 937. 2. Normal coronary origin with right dominance. 3. Moderate proximal LAD stenosis (50-69%). 4. Mild proxima RCA stenosis (25-49%). 5. Minimal  stenosis in the distal LM, LCx (<25%).   ctFFR: 1. Left Main:  No significant stenosis. 2. LAD: No significant stenosis.  FFRct 0.85 3. LCX: No significant stenosis.  FFRct 0.97 4. RCA: No significant stenosis.  FFRct 0.94   IMPRESSION: 1.  CT FFR analysis didn't show any significant stenosis. __________   Calcium  score 06/07/2022: Left Main: 0   LAD: 796   LCx: 35.2   RCA: 105   Total Agatston Score: 937   [place]ESA database percentile: 77   AORTA MEASUREMENTS:   Ascending Aorta: 4.2 cm   Descending Aorta:2.6 cm   OTHER FINDINGS:   Cardiovascular: Aortic atherosclerosis and 3 vessel coronary artery disease. Normal heart size. No pericardial effusion.   Mediastinum/Nodes: No adenopathy or acute process in the mediastinum.   Lungs/Pleura: Lungs are clear and airways are patent to the extent evaluated.   Upper Abdomen: No acute findings in the upper abdomen.   Musculoskeletal: No acute bone finding. No destructive bone process. Spinal degenerative changes. Degenerative changes are mild-to-moderate.   IMPRESSION: 1. Coronary artery calcium  score of 937. [place] database percentile: 67. 2. Aortic atherosclerosis and 3 vessel coronary artery disease.  Patient Profile     79 y.o. male with history of CAD, HTN, HLD, RBBB, and OSA on CPAP who is being seen today for the evaluation of unstable angina at the request of Dr. Arlander.   Assessment & Plan    1. CAD involving the native coronary arteries with unstable angina: - Currently, without symptoms of angina or cardiac decompensation - He reports an increase in sporadic left-sided chest discomfort over the past 24 hours from baseline - Coronary CTA in 08/2022 demonstrated moderate nonobstructive LAD stenosis estimated at 50 to 69% that was not hemodynamically significant by CT FFR - Initial high-sensitivity troponin negative x 2 - Given concern for unstable angina he was started on heparin  drip - N.p.o.  - Plan for LHC  today - Echo pending - ASA 81 mg daily - PTA atorvastatin  80 mg - LP(a) pending   2.  HTN: - Blood pressure improved - Has been continued on PTA hydrochlorothiazide  and lisinopril  - With renal dysfunction, may need to consider transitioning off hydrochlorothiazide     3.  HLD: - LDL 46 in 05/2024 - PTA atorvastatin  80 mg   4.  Renal dysfunction: - Patient intermittently noted to have elevations in serum creatinine with GFR typically in the 50s - Monitor    5.  Hyperglycemia: - A1c 6.0 in 05/2024   Informed Consent   Shared Decision Making/Informed Consent{  The risks [stroke (1 in  1000), death (1 in 1000), kidney failure [usually temporary] (1 in 500), bleeding (1 in 200), allergic reaction [possibly serious] (1 in 200)], benefits (diagnostic support and management of coronary artery disease) and alternatives of a cardiac catheterization were discussed in detail with Mr. Bartko and he is willing to proceed.      For questions or updates, please contact [facility] Please consult [email or web address] for contact info under Cardiology/STEMI.    Signed, Bernardino Bring, PA-C [facility] Pager: [phone] 10/15/2024, 8:32 AM  "

## 2024-10-20 ENCOUNTER — Inpatient Hospital Stay
Admission: RE | Admit: 2024-10-20 | Discharge: 2024-10-20 | Attending: Physical Medicine & Rehabilitation | Admitting: Physical Medicine & Rehabilitation

## 2024-10-20 DIAGNOSIS — G8929 Other chronic pain: Secondary | ICD-10-CM

## 2024-11-04 ENCOUNTER — Ambulatory Visit: Admitting: Physician Assistant

## 2025-02-11 ENCOUNTER — Other Ambulatory Visit: Admitting: Urology
